# Patient Record
Sex: Female | Born: 1945
Health system: Southern US, Community
[De-identification: ages and names within clinical notes are randomized; demographics above are authoritative.]

## PROBLEM LIST (undated history)

## (undated) DIAGNOSIS — I2510 Atherosclerotic heart disease of native coronary artery without angina pectoris: Secondary | ICD-10-CM

## (undated) DIAGNOSIS — K219 Gastro-esophageal reflux disease without esophagitis: Secondary | ICD-10-CM

## (undated) DIAGNOSIS — F419 Anxiety disorder, unspecified: Secondary | ICD-10-CM

## (undated) DIAGNOSIS — E875 Hyperkalemia: Secondary | ICD-10-CM

## (undated) DIAGNOSIS — E7800 Pure hypercholesterolemia, unspecified: Secondary | ICD-10-CM

## (undated) DIAGNOSIS — J42 Unspecified chronic bronchitis: Secondary | ICD-10-CM

## (undated) DIAGNOSIS — E2839 Other primary ovarian failure: Secondary | ICD-10-CM

## (undated) DIAGNOSIS — R5383 Other fatigue: Secondary | ICD-10-CM

## (undated) DIAGNOSIS — R42 Dizziness and giddiness: Secondary | ICD-10-CM

## (undated) DIAGNOSIS — R0602 Shortness of breath: Secondary | ICD-10-CM

## (undated) DIAGNOSIS — I10 Essential (primary) hypertension: Secondary | ICD-10-CM

## (undated) DIAGNOSIS — I509 Heart failure, unspecified: Secondary | ICD-10-CM

## (undated) DIAGNOSIS — J449 Chronic obstructive pulmonary disease, unspecified: Secondary | ICD-10-CM

## (undated) HISTORY — DX: Other primary ovarian failure: E28.39

## (undated) HISTORY — PX: APPENDECTOMY: SHX54

## (undated) HISTORY — DX: Chronic obstructive pulmonary disease, unspecified: J44.9

## (undated) HISTORY — DX: Dizziness and giddiness: R42

## (undated) HISTORY — PX: ABDOMINAL HYSTERECTOMY: SHX81

## (undated) HISTORY — DX: Anxiety disorder, unspecified: F41.9

## (undated) HISTORY — DX: Other fatigue: R53.83

## (undated) HISTORY — DX: Atherosclerotic heart disease of native coronary artery without angina pectoris: I25.10

## (undated) HISTORY — DX: Shortness of breath: R06.02

## (undated) HISTORY — PX: BREAST BIOPSY: SHX20

## (undated) HISTORY — DX: Pure hypercholesterolemia, unspecified: E78.00

## (undated) HISTORY — DX: Hyperkalemia: E87.5

## (undated) HISTORY — DX: Gastro-esophageal reflux disease without esophagitis: K21.9

## (undated) HISTORY — DX: Essential (primary) hypertension: I10

---

## 1998-01-11 HISTORY — PX: CARDIAC CATHETERIZATION: SHX172

## 1998-01-11 HISTORY — PX: CORONARY ARTERY BYPASS GRAFT: SHX141

## 1998-10-21 ENCOUNTER — Encounter: Payer: Self-pay | Admitting: Internal Medicine

## 1998-10-21 ENCOUNTER — Inpatient Hospital Stay (HOSPITAL_COMMUNITY): Admission: EM | Admit: 1998-10-21 | Discharge: 1998-10-27 | Payer: Self-pay | Admitting: Internal Medicine

## 1998-10-22 ENCOUNTER — Encounter: Payer: Self-pay | Admitting: Internal Medicine

## 1998-10-23 ENCOUNTER — Encounter: Payer: Self-pay | Admitting: Thoracic Surgery (Cardiothoracic Vascular Surgery)

## 1998-10-24 ENCOUNTER — Encounter: Payer: Self-pay | Admitting: Thoracic Surgery (Cardiothoracic Vascular Surgery)

## 1998-10-25 ENCOUNTER — Encounter: Payer: Self-pay | Admitting: Thoracic Surgery (Cardiothoracic Vascular Surgery)

## 2015-03-26 DIAGNOSIS — Z951 Presence of aortocoronary bypass graft: Secondary | ICD-10-CM | POA: Diagnosis not present

## 2015-03-26 DIAGNOSIS — R05 Cough: Secondary | ICD-10-CM | POA: Diagnosis not present

## 2015-03-26 DIAGNOSIS — I10 Essential (primary) hypertension: Secondary | ICD-10-CM | POA: Diagnosis not present

## 2015-03-26 DIAGNOSIS — F17200 Nicotine dependence, unspecified, uncomplicated: Secondary | ICD-10-CM | POA: Diagnosis not present

## 2015-03-26 DIAGNOSIS — J441 Chronic obstructive pulmonary disease with (acute) exacerbation: Secondary | ICD-10-CM | POA: Diagnosis not present

## 2015-03-26 DIAGNOSIS — Z299 Encounter for prophylactic measures, unspecified: Secondary | ICD-10-CM | POA: Diagnosis not present

## 2015-03-26 DIAGNOSIS — R0602 Shortness of breath: Secondary | ICD-10-CM | POA: Diagnosis not present

## 2015-03-28 DIAGNOSIS — E7800 Pure hypercholesterolemia, unspecified: Secondary | ICD-10-CM | POA: Diagnosis not present

## 2015-03-28 DIAGNOSIS — J449 Chronic obstructive pulmonary disease, unspecified: Secondary | ICD-10-CM | POA: Diagnosis not present

## 2015-03-28 DIAGNOSIS — Z681 Body mass index (BMI) 19 or less, adult: Secondary | ICD-10-CM | POA: Diagnosis not present

## 2015-03-28 DIAGNOSIS — I10 Essential (primary) hypertension: Secondary | ICD-10-CM | POA: Diagnosis not present

## 2015-03-28 DIAGNOSIS — F17200 Nicotine dependence, unspecified, uncomplicated: Secondary | ICD-10-CM | POA: Diagnosis not present

## 2015-03-28 DIAGNOSIS — F419 Anxiety disorder, unspecified: Secondary | ICD-10-CM | POA: Diagnosis not present

## 2015-04-01 DIAGNOSIS — F419 Anxiety disorder, unspecified: Secondary | ICD-10-CM | POA: Diagnosis not present

## 2015-04-01 DIAGNOSIS — F17200 Nicotine dependence, unspecified, uncomplicated: Secondary | ICD-10-CM | POA: Diagnosis not present

## 2015-04-01 DIAGNOSIS — J449 Chronic obstructive pulmonary disease, unspecified: Secondary | ICD-10-CM | POA: Diagnosis not present

## 2015-07-31 DIAGNOSIS — J449 Chronic obstructive pulmonary disease, unspecified: Secondary | ICD-10-CM | POA: Diagnosis not present

## 2015-07-31 DIAGNOSIS — I10 Essential (primary) hypertension: Secondary | ICD-10-CM | POA: Diagnosis not present

## 2015-08-28 DIAGNOSIS — I10 Essential (primary) hypertension: Secondary | ICD-10-CM | POA: Diagnosis not present

## 2015-08-28 DIAGNOSIS — J449 Chronic obstructive pulmonary disease, unspecified: Secondary | ICD-10-CM | POA: Diagnosis not present

## 2015-09-30 DIAGNOSIS — F17200 Nicotine dependence, unspecified, uncomplicated: Secondary | ICD-10-CM | POA: Diagnosis not present

## 2015-09-30 DIAGNOSIS — J441 Chronic obstructive pulmonary disease with (acute) exacerbation: Secondary | ICD-10-CM | POA: Diagnosis not present

## 2015-09-30 DIAGNOSIS — Z681 Body mass index (BMI) 19 or less, adult: Secondary | ICD-10-CM | POA: Diagnosis not present

## 2015-09-30 DIAGNOSIS — Z90710 Acquired absence of both cervix and uterus: Secondary | ICD-10-CM | POA: Diagnosis not present

## 2015-10-20 DIAGNOSIS — Z299 Encounter for prophylactic measures, unspecified: Secondary | ICD-10-CM | POA: Diagnosis not present

## 2015-10-20 DIAGNOSIS — Z1211 Encounter for screening for malignant neoplasm of colon: Secondary | ICD-10-CM | POA: Diagnosis not present

## 2015-10-20 DIAGNOSIS — Z0000 Encounter for general adult medical examination without abnormal findings: Secondary | ICD-10-CM | POA: Diagnosis not present

## 2015-10-20 DIAGNOSIS — Z79899 Other long term (current) drug therapy: Secondary | ICD-10-CM | POA: Diagnosis not present

## 2015-10-20 DIAGNOSIS — Z7189 Other specified counseling: Secondary | ICD-10-CM | POA: Diagnosis not present

## 2015-10-20 DIAGNOSIS — Z1389 Encounter for screening for other disorder: Secondary | ICD-10-CM | POA: Diagnosis not present

## 2015-10-20 DIAGNOSIS — R5383 Other fatigue: Secondary | ICD-10-CM | POA: Diagnosis not present

## 2015-10-20 DIAGNOSIS — E7800 Pure hypercholesterolemia, unspecified: Secondary | ICD-10-CM | POA: Diagnosis not present

## 2015-11-24 DIAGNOSIS — J449 Chronic obstructive pulmonary disease, unspecified: Secondary | ICD-10-CM | POA: Diagnosis not present

## 2015-11-24 DIAGNOSIS — I10 Essential (primary) hypertension: Secondary | ICD-10-CM | POA: Diagnosis not present

## 2016-01-13 DIAGNOSIS — E7800 Pure hypercholesterolemia, unspecified: Secondary | ICD-10-CM | POA: Diagnosis not present

## 2016-01-14 DIAGNOSIS — I10 Essential (primary) hypertension: Secondary | ICD-10-CM | POA: Diagnosis not present

## 2016-01-14 DIAGNOSIS — J449 Chronic obstructive pulmonary disease, unspecified: Secondary | ICD-10-CM | POA: Diagnosis not present

## 2016-01-20 DIAGNOSIS — Z681 Body mass index (BMI) 19 or less, adult: Secondary | ICD-10-CM | POA: Diagnosis not present

## 2016-01-20 DIAGNOSIS — I10 Essential (primary) hypertension: Secondary | ICD-10-CM | POA: Diagnosis not present

## 2016-01-20 DIAGNOSIS — J439 Emphysema, unspecified: Secondary | ICD-10-CM | POA: Diagnosis not present

## 2016-01-20 DIAGNOSIS — Z299 Encounter for prophylactic measures, unspecified: Secondary | ICD-10-CM | POA: Diagnosis not present

## 2016-01-20 DIAGNOSIS — F17210 Nicotine dependence, cigarettes, uncomplicated: Secondary | ICD-10-CM | POA: Diagnosis not present

## 2016-02-25 DIAGNOSIS — J449 Chronic obstructive pulmonary disease, unspecified: Secondary | ICD-10-CM | POA: Diagnosis not present

## 2016-02-25 DIAGNOSIS — I10 Essential (primary) hypertension: Secondary | ICD-10-CM | POA: Diagnosis not present

## 2016-04-20 DIAGNOSIS — I10 Essential (primary) hypertension: Secondary | ICD-10-CM | POA: Diagnosis not present

## 2016-04-20 DIAGNOSIS — E7800 Pure hypercholesterolemia, unspecified: Secondary | ICD-10-CM | POA: Diagnosis not present

## 2016-04-20 DIAGNOSIS — Z681 Body mass index (BMI) 19 or less, adult: Secondary | ICD-10-CM | POA: Diagnosis not present

## 2016-04-20 DIAGNOSIS — F17210 Nicotine dependence, cigarettes, uncomplicated: Secondary | ICD-10-CM | POA: Diagnosis not present

## 2016-04-20 DIAGNOSIS — J449 Chronic obstructive pulmonary disease, unspecified: Secondary | ICD-10-CM | POA: Diagnosis not present

## 2016-04-20 DIAGNOSIS — Z299 Encounter for prophylactic measures, unspecified: Secondary | ICD-10-CM | POA: Diagnosis not present

## 2016-05-21 DIAGNOSIS — J449 Chronic obstructive pulmonary disease, unspecified: Secondary | ICD-10-CM | POA: Diagnosis not present

## 2016-05-21 DIAGNOSIS — I10 Essential (primary) hypertension: Secondary | ICD-10-CM | POA: Diagnosis not present

## 2016-06-11 DIAGNOSIS — I10 Essential (primary) hypertension: Secondary | ICD-10-CM | POA: Diagnosis not present

## 2016-06-11 DIAGNOSIS — J449 Chronic obstructive pulmonary disease, unspecified: Secondary | ICD-10-CM | POA: Diagnosis not present

## 2016-07-27 DIAGNOSIS — J449 Chronic obstructive pulmonary disease, unspecified: Secondary | ICD-10-CM | POA: Diagnosis not present

## 2016-07-27 DIAGNOSIS — I10 Essential (primary) hypertension: Secondary | ICD-10-CM | POA: Diagnosis not present

## 2016-09-01 DIAGNOSIS — J449 Chronic obstructive pulmonary disease, unspecified: Secondary | ICD-10-CM | POA: Diagnosis not present

## 2016-09-01 DIAGNOSIS — I10 Essential (primary) hypertension: Secondary | ICD-10-CM | POA: Diagnosis not present

## 2016-09-28 DIAGNOSIS — J449 Chronic obstructive pulmonary disease, unspecified: Secondary | ICD-10-CM | POA: Diagnosis not present

## 2016-09-28 DIAGNOSIS — I10 Essential (primary) hypertension: Secondary | ICD-10-CM | POA: Diagnosis not present

## 2016-11-29 DIAGNOSIS — Z681 Body mass index (BMI) 19 or less, adult: Secondary | ICD-10-CM | POA: Diagnosis not present

## 2016-11-29 DIAGNOSIS — J441 Chronic obstructive pulmonary disease with (acute) exacerbation: Secondary | ICD-10-CM | POA: Diagnosis not present

## 2016-11-29 DIAGNOSIS — J449 Chronic obstructive pulmonary disease, unspecified: Secondary | ICD-10-CM | POA: Diagnosis not present

## 2016-11-29 DIAGNOSIS — I10 Essential (primary) hypertension: Secondary | ICD-10-CM | POA: Diagnosis not present

## 2016-11-29 DIAGNOSIS — F17210 Nicotine dependence, cigarettes, uncomplicated: Secondary | ICD-10-CM | POA: Diagnosis not present

## 2016-11-29 DIAGNOSIS — Z299 Encounter for prophylactic measures, unspecified: Secondary | ICD-10-CM | POA: Diagnosis not present

## 2016-12-06 DIAGNOSIS — J449 Chronic obstructive pulmonary disease, unspecified: Secondary | ICD-10-CM | POA: Diagnosis not present

## 2016-12-06 DIAGNOSIS — I10 Essential (primary) hypertension: Secondary | ICD-10-CM | POA: Diagnosis not present

## 2016-12-11 DIAGNOSIS — I509 Heart failure, unspecified: Secondary | ICD-10-CM

## 2016-12-11 HISTORY — DX: Heart failure, unspecified: I50.9

## 2016-12-29 DIAGNOSIS — Z681 Body mass index (BMI) 19 or less, adult: Secondary | ICD-10-CM | POA: Diagnosis not present

## 2016-12-29 DIAGNOSIS — R079 Chest pain, unspecified: Secondary | ICD-10-CM | POA: Diagnosis not present

## 2016-12-29 DIAGNOSIS — Z951 Presence of aortocoronary bypass graft: Secondary | ICD-10-CM | POA: Diagnosis not present

## 2016-12-29 DIAGNOSIS — E871 Hypo-osmolality and hyponatremia: Secondary | ICD-10-CM | POA: Diagnosis not present

## 2016-12-29 DIAGNOSIS — J189 Pneumonia, unspecified organism: Secondary | ICD-10-CM | POA: Diagnosis not present

## 2016-12-29 DIAGNOSIS — Z299 Encounter for prophylactic measures, unspecified: Secondary | ICD-10-CM | POA: Diagnosis not present

## 2016-12-29 DIAGNOSIS — J90 Pleural effusion, not elsewhere classified: Secondary | ICD-10-CM | POA: Diagnosis not present

## 2016-12-29 DIAGNOSIS — R0602 Shortness of breath: Secondary | ICD-10-CM | POA: Diagnosis not present

## 2016-12-29 DIAGNOSIS — F17210 Nicotine dependence, cigarettes, uncomplicated: Secondary | ICD-10-CM | POA: Diagnosis not present

## 2016-12-29 DIAGNOSIS — Z720 Tobacco use: Secondary | ICD-10-CM | POA: Diagnosis not present

## 2016-12-29 DIAGNOSIS — R7989 Other specified abnormal findings of blood chemistry: Secondary | ICD-10-CM | POA: Diagnosis not present

## 2016-12-29 DIAGNOSIS — Z2821 Immunization not carried out because of patient refusal: Secondary | ICD-10-CM | POA: Diagnosis not present

## 2016-12-29 DIAGNOSIS — R5383 Other fatigue: Secondary | ICD-10-CM | POA: Diagnosis not present

## 2016-12-29 DIAGNOSIS — I509 Heart failure, unspecified: Secondary | ICD-10-CM | POA: Diagnosis not present

## 2016-12-29 DIAGNOSIS — Z7982 Long term (current) use of aspirin: Secondary | ICD-10-CM | POA: Diagnosis not present

## 2016-12-29 DIAGNOSIS — F17200 Nicotine dependence, unspecified, uncomplicated: Secondary | ICD-10-CM | POA: Diagnosis not present

## 2016-12-29 DIAGNOSIS — I10 Essential (primary) hypertension: Secondary | ICD-10-CM | POA: Diagnosis not present

## 2016-12-29 DIAGNOSIS — I2510 Atherosclerotic heart disease of native coronary artery without angina pectoris: Secondary | ICD-10-CM | POA: Diagnosis not present

## 2016-12-29 DIAGNOSIS — I5021 Acute systolic (congestive) heart failure: Secondary | ICD-10-CM | POA: Diagnosis not present

## 2016-12-29 DIAGNOSIS — J449 Chronic obstructive pulmonary disease, unspecified: Secondary | ICD-10-CM | POA: Diagnosis not present

## 2016-12-30 DIAGNOSIS — I2510 Atherosclerotic heart disease of native coronary artery without angina pectoris: Secondary | ICD-10-CM | POA: Diagnosis present

## 2016-12-30 DIAGNOSIS — F17200 Nicotine dependence, unspecified, uncomplicated: Secondary | ICD-10-CM | POA: Diagnosis present

## 2016-12-30 DIAGNOSIS — Z720 Tobacco use: Secondary | ICD-10-CM | POA: Diagnosis not present

## 2016-12-30 DIAGNOSIS — Z79899 Other long term (current) drug therapy: Secondary | ICD-10-CM | POA: Diagnosis not present

## 2016-12-30 DIAGNOSIS — J90 Pleural effusion, not elsewhere classified: Secondary | ICD-10-CM | POA: Diagnosis not present

## 2016-12-30 DIAGNOSIS — Z7982 Long term (current) use of aspirin: Secondary | ICD-10-CM | POA: Diagnosis not present

## 2016-12-30 DIAGNOSIS — J189 Pneumonia, unspecified organism: Secondary | ICD-10-CM | POA: Diagnosis not present

## 2016-12-30 DIAGNOSIS — E871 Hypo-osmolality and hyponatremia: Secondary | ICD-10-CM | POA: Diagnosis not present

## 2016-12-30 DIAGNOSIS — Z951 Presence of aortocoronary bypass graft: Secondary | ICD-10-CM | POA: Diagnosis not present

## 2016-12-30 DIAGNOSIS — R7989 Other specified abnormal findings of blood chemistry: Secondary | ICD-10-CM | POA: Diagnosis not present

## 2016-12-30 DIAGNOSIS — I5021 Acute systolic (congestive) heart failure: Secondary | ICD-10-CM | POA: Diagnosis present

## 2016-12-30 DIAGNOSIS — I509 Heart failure, unspecified: Secondary | ICD-10-CM | POA: Diagnosis not present

## 2017-01-14 DIAGNOSIS — J449 Chronic obstructive pulmonary disease, unspecified: Secondary | ICD-10-CM | POA: Diagnosis not present

## 2017-01-14 DIAGNOSIS — I5022 Chronic systolic (congestive) heart failure: Secondary | ICD-10-CM | POA: Diagnosis not present

## 2017-01-14 DIAGNOSIS — I501 Left ventricular failure: Secondary | ICD-10-CM | POA: Diagnosis not present

## 2017-01-14 DIAGNOSIS — I10 Essential (primary) hypertension: Secondary | ICD-10-CM | POA: Diagnosis not present

## 2017-01-14 DIAGNOSIS — E7800 Pure hypercholesterolemia, unspecified: Secondary | ICD-10-CM | POA: Diagnosis not present

## 2017-01-14 DIAGNOSIS — Z681 Body mass index (BMI) 19 or less, adult: Secondary | ICD-10-CM | POA: Diagnosis not present

## 2017-01-14 DIAGNOSIS — Z299 Encounter for prophylactic measures, unspecified: Secondary | ICD-10-CM | POA: Diagnosis not present

## 2017-02-07 ENCOUNTER — Encounter: Payer: Self-pay | Admitting: *Deleted

## 2017-02-07 ENCOUNTER — Other Ambulatory Visit: Payer: Self-pay

## 2017-02-07 ENCOUNTER — Telehealth: Payer: Self-pay | Admitting: Cardiovascular Disease

## 2017-02-07 ENCOUNTER — Ambulatory Visit (INDEPENDENT_AMBULATORY_CARE_PROVIDER_SITE_OTHER): Payer: Medicare Other | Admitting: Cardiovascular Disease

## 2017-02-07 ENCOUNTER — Encounter: Payer: Self-pay | Admitting: Cardiovascular Disease

## 2017-02-07 VITALS — BP 152/77 | HR 75 | Ht 60.0 in | Wt 87.0 lb

## 2017-02-07 DIAGNOSIS — I519 Heart disease, unspecified: Secondary | ICD-10-CM

## 2017-02-07 DIAGNOSIS — I10 Essential (primary) hypertension: Secondary | ICD-10-CM

## 2017-02-07 DIAGNOSIS — Z9289 Personal history of other medical treatment: Secondary | ICD-10-CM | POA: Diagnosis not present

## 2017-02-07 DIAGNOSIS — I25118 Atherosclerotic heart disease of native coronary artery with other forms of angina pectoris: Secondary | ICD-10-CM | POA: Diagnosis not present

## 2017-02-07 DIAGNOSIS — I5022 Chronic systolic (congestive) heart failure: Secondary | ICD-10-CM | POA: Diagnosis not present

## 2017-02-07 DIAGNOSIS — Z951 Presence of aortocoronary bypass graft: Secondary | ICD-10-CM

## 2017-02-07 DIAGNOSIS — Z720 Tobacco use: Secondary | ICD-10-CM

## 2017-02-07 MED ORDER — SACUBITRIL-VALSARTAN 49-51 MG PO TABS: 1.0000 | ORAL_TABLET | Freq: Two times a day (BID) | ORAL | 6 refills | Status: DC

## 2017-02-07 MED ORDER — ROSUVASTATIN CALCIUM 5 MG PO TABS: 5.0000 mg | ORAL_TABLET | Freq: Every day | ORAL | 6 refills | Status: DC

## 2017-02-07 NOTE — Telephone Encounter (Signed)
Pre-cert Verification for the following procedure   lexiscan scheduled for 02/16/17 at [facility]

## 2017-02-07 NOTE — Progress Notes (Signed)
CARDIOLOGY CONSULT NOTE  Patient ID: Susan [name] MRN: [id number] DOB/AGE: 73-Aug-1947 73 y.o.  Admit date: (Not on file) Primary Physician: [name], MD Referring Physician: Dr. Sherryll Burger  Reason for Consultation: CHF  HPI: Susan [name] is a 72 y.o. female who is being seen today for the evaluation of congestive heart failure at the request of [name], MD.   I reviewed all documentation, labs, and studies from her PCP.  She is apparently hospitalized at [facility] for shortness of breath and diagnosed with congestive heart failure in December 2018.  She is currently on metoprolol succinate, lisinopril, Lasix, and Entresto.  An echocardiogram performed at an outside facility on 12/31/16 demonstrated severely reduced left ventricular systolic function, LVEF 20-25%, grade 2 diastolic dysfunction with elevated filling pressures, mild left atrial dilatation, mild right ventricular dilatation, moderately reduced right ventricular systolic function, mild to moderate mitral and moderate tricuspid regurgitation.  There was pulmonary hypertension, RVSP 58 mmHg.  The IVC was dilated and did not collapse with respiration.  I reviewed an ECG performed on 12/29/16 which demonstrated sinus tachycardia, 110 bpm, with nonspecific ST segment abnormalities in V5 and V6.  She tells me she underwent 5 vessel coronary artery bypass graft surgery in 2000.  She has not followed up with a cardiologist since shortly after bypass surgery.  The patient denies any symptoms of chest pain, palpitations, shortness of breath, lightheadedness, dizziness, leg swelling, orthopnea, PND, and syncope.  She had been smoking a pack of cigarettes daily but recently cut back to 1/2 pack daily.   No Known Allergies  Current Outpatient Medications  Medication Sig Dispense Refill  . aspirin EC 81 MG tablet Take 81 mg by mouth daily.    . furosemide (LASIX) 40 MG tablet Take 40 mg by mouth 2 (two) times  daily.  0  . KLOR-CON M20 20 MEQ tablet Take 20 mEq by mouth daily.  0  . lisinopril (PRINIVIL,ZESTRIL) 10 MG tablet Take 10 mg by mouth daily.  0  . metoprolol succinate (TOPROL-XL) 25 MG 24 hr tablet Take 12.5 mg by mouth daily.  0  . Multiple Vitamin (MULTIVITAMIN) tablet Take 1 tablet by mouth daily.    . sacubitril-valsartan (ENTRESTO) 24-26 MG Take 1 tablet by mouth 2 (two) times daily.    . vitamin B-12 (CYANOCOBALAMIN) 250 MCG tablet Take 250 mcg by mouth daily.     No current facility-administered medications for this visit.     Past Medical History:  Diagnosis Date  . Anxiety   . CAD (coronary artery disease)   . COPD (chronic obstructive pulmonary disease) (HCC)   . Dizziness   . Esophageal reflux   . Essential hypertension   . Fatigue   . Hypercholesteremia   . Hyperkalemia   . Ovarian failure   . SOB (shortness of breath)     Past Surgical History:  Procedure Laterality Date  . ABDOMINAL HYSTERECTOMY    . APPENDECTOMY      Social History   Socioeconomic History  . Marital status: Married    Spouse name: Not on file  . Number of children: Not on file  . Years of education: Not on file  . Highest education level: Not on file  Social Needs  . Financial resource strain: Not on file  . Food insecurity - worry: Not on file  . Food insecurity - inability: Not on file  . Transportation needs - medical: Not on file  . Transportation needs -  non-medical: Not on file  Occupational History  . Not on file  Tobacco Use  . Smoking status: Current Every Day Smoker    Packs/day: 1.00    Types: Cigarettes  . Smokeless tobacco: Never Used  Substance and Sexual Activity  . Alcohol use: Not on file  . Drug use: Not on file  . Sexual activity: Not on file  Other Topics Concern  . Not on file  Social History Narrative  . Not on file     No family history of premature CAD in 1st degree relatives.  Current Meds  Medication Sig  . aspirin EC 81 MG tablet Take 81  mg by mouth daily.  . furosemide (LASIX) 40 MG tablet Take 40 mg by mouth 2 (two) times daily.  Marland Kitchen KLOR-CON M20 20 MEQ tablet Take 20 mEq by mouth daily.  Marland Kitchen lisinopril (PRINIVIL,ZESTRIL) 10 MG tablet Take 10 mg by mouth daily.  . metoprolol succinate (TOPROL-XL) 25 MG 24 hr tablet Take 12.5 mg by mouth daily.  . Multiple Vitamin (MULTIVITAMIN) tablet Take 1 tablet by mouth daily.  . sacubitril-valsartan (ENTRESTO) 24-26 MG Take 1 tablet by mouth 2 (two) times daily.  . vitamin B-12 (CYANOCOBALAMIN) 250 MCG tablet Take 250 mcg by mouth daily.  . [DISCONTINUED] aspirin 325 MG EC tablet Take 325 mg by mouth daily.      Review of systems complete and found to be negative unless listed above in HPI    Physical exam Blood pressure (!) 152/77, pulse 75, height 5' (1.524 m), weight 87 lb (39.5 kg), SpO2 98 %. General: NAD, thin build Neck: No JVD, no thyromegaly or thyroid nodule.  Lungs: Diminished throughout, no crackles or wheezes. CV: Nondisplaced PMI. Regular rate and rhythm, normal S1/S2, no S3/S4, no murmur.  No peripheral edema.  No carotid bruit.    Abdomen: Soft, nontender, no distention.  Skin: Intact without lesions or rashes.  Neurologic: Alert and oriented x 3.  Psych: Normal affect. Extremities: No clubbing or cyanosis.  HEENT: Normal.   ECG: Most recent ECG reviewed.   Labs: No results found for: K, BUN, CREATININE, ALT, TSH, HGB   Lipids: No results found for: LDLCALC, LDLDIRECT, CHOL, TRIG, HDL      ASSESSMENT AND PLAN:  1.  Chronic systolic heart failure: Symptomatically stable and appears euvolemic.  Currently on Toprol-XL 12.5 mg daily, lisinopril 10 mg daily, Lasix 40 mg twice daily, and Entresto 24-26 mg twice daily.  There is no indication to be on both an ACE inhibitor and angiotensin receptor blocker and it significantly increases the risk for hyperkalemia.  For this reason, I will discontinue lisinopril.  Blood pressure is elevated.  I will increase the dose  of Entresto to 49-51 mg bid. She will need to be evaluated for an ischemic etiology given her history of 5 vessel CABG in 2000.  I will obtain a Lexiscan Myoview stress test.  2.  Hypertension: There is no indication to be on both an ACE inhibitor and angiotensin receptor blocker and it significantly increases the risk for hyperkalemia.  For this reason, I will discontinue lisinopril.  Blood pressure is elevated.  I will increase the dose of Entresto to 49-51 mg bid.  3.  Coronary artery disease with history of 5 vessel CABG: Symptomatically stable.  She is currently on aspirin, angiotensin receptor blocker, and low-dose metoprolol succinate.  I will add a low-dose of Crestor 5 mg daily.  I will obtain a copy of lipids from PCP. As  it has been 19 years since bypass surgery and she now has severe left ventricular dysfunction, she has likely lost at least one saphenous vein graft if not more.  I will try and obtain a copy of the operative report.  I will also obtain a Lexiscan Myoview nuclear stress test to evaluate for significant ischemia.  4.  Tobacco abuse disorder: Currently smoking 1/2 pack of cigarettes daily.    Disposition: Follow up in 6-8 weeks.   Signed: Prentice Docker, M.D., F.A.C.C.  02/07/2017, 2:53 PM

## 2017-02-07 NOTE — Patient Instructions (Signed)
Medication Instructions:   Stop Lisinopril.   Increase Entresto 49/51mg  twice a day.  Begin Crestor 5mg  daily.  Continue all other medications.    Labwork: none  Testing/Procedures:  Your physician has requested that you have a lexiscan myoview. For further information please visit [email or web address] Please follow instruction sheet, as given.  Office will contact with results via phone or letter.    Follow-Up: 6-8 weeks   Any Other Special Instructions Will Be Listed Below (If Applicable).  If you need a refill on your cardiac medications before your next appointment, please call your pharmacy.

## 2017-02-07 NOTE — Addendum Note (Signed)
Addended by: Lesle Chris on: 02/07/2017 03:22 PM   Modules accepted: Orders

## 2017-02-11 ENCOUNTER — Encounter: Payer: Self-pay | Admitting: *Deleted

## 2017-02-16 ENCOUNTER — Encounter (HOSPITAL_COMMUNITY)
Admission: RE | Admit: 2017-02-16 | Discharge: 2017-02-16 | Disposition: A | Discharge status: Home / Self Care | Payer: Medicare Other | Source: Ambulatory Visit | Attending: Cardiovascular Disease | Admitting: Cardiovascular Disease

## 2017-02-16 ENCOUNTER — Encounter (HOSPITAL_COMMUNITY): Payer: Self-pay

## 2017-02-16 ENCOUNTER — Inpatient Hospital Stay (HOSPITAL_COMMUNITY)
Admission: AD | Admit: 2017-02-16 | Discharge: 2017-02-18 | DRG: 287 | Disposition: A | Discharge status: Home / Self Care | Payer: Medicare Other | Source: Ambulatory Visit | Attending: Cardiovascular Disease | Admitting: Cardiovascular Disease

## 2017-02-16 ENCOUNTER — Encounter (HOSPITAL_COMMUNITY): Payer: Self-pay | Admitting: General Practice

## 2017-02-16 ENCOUNTER — Encounter: Payer: Self-pay | Admitting: *Deleted

## 2017-02-16 ENCOUNTER — Other Ambulatory Visit: Payer: Self-pay

## 2017-02-16 ENCOUNTER — Other Ambulatory Visit (HOSPITAL_COMMUNITY)
Admission: RE | Admit: 2017-02-16 | Discharge: 2017-02-16 | Disposition: A | Discharge status: Home / Self Care | Payer: Medicare Other | Source: Ambulatory Visit | Attending: Cardiovascular Disease | Admitting: Cardiovascular Disease

## 2017-02-16 ENCOUNTER — Telehealth: Payer: Self-pay | Admitting: *Deleted

## 2017-02-16 ENCOUNTER — Ambulatory Visit (HOSPITAL_BASED_OUTPATIENT_CLINIC_OR_DEPARTMENT_OTHER)
Admission: RE | Admit: 2017-02-16 | Discharge: 2017-02-16 | Disposition: A | Discharge status: Home / Self Care | Payer: Medicare Other | Source: Ambulatory Visit | Attending: Cardiovascular Disease | Admitting: Cardiovascular Disease

## 2017-02-16 ENCOUNTER — Other Ambulatory Visit: Payer: Self-pay | Admitting: *Deleted

## 2017-02-16 DIAGNOSIS — Z79899 Other long term (current) drug therapy: Secondary | ICD-10-CM | POA: Diagnosis not present

## 2017-02-16 DIAGNOSIS — I5189 Other ill-defined heart diseases: Secondary | ICD-10-CM | POA: Diagnosis present

## 2017-02-16 DIAGNOSIS — Z01818 Encounter for other preprocedural examination: Secondary | ICD-10-CM

## 2017-02-16 DIAGNOSIS — Z951 Presence of aortocoronary bypass graft: Secondary | ICD-10-CM | POA: Diagnosis not present

## 2017-02-16 DIAGNOSIS — N183 Chronic kidney disease, stage 3 (moderate): Secondary | ICD-10-CM

## 2017-02-16 DIAGNOSIS — R079 Chest pain, unspecified: Secondary | ICD-10-CM

## 2017-02-16 DIAGNOSIS — I10 Essential (primary) hypertension: Secondary | ICD-10-CM

## 2017-02-16 DIAGNOSIS — I5022 Chronic systolic (congestive) heart failure: Secondary | ICD-10-CM | POA: Diagnosis not present

## 2017-02-16 DIAGNOSIS — R9439 Abnormal result of other cardiovascular function study: Secondary | ICD-10-CM

## 2017-02-16 DIAGNOSIS — I25708 Atherosclerosis of coronary artery bypass graft(s), unspecified, with other forms of angina pectoris: Secondary | ICD-10-CM | POA: Diagnosis not present

## 2017-02-16 DIAGNOSIS — Z7982 Long term (current) use of aspirin: Secondary | ICD-10-CM | POA: Diagnosis not present

## 2017-02-16 DIAGNOSIS — I2510 Atherosclerotic heart disease of native coronary artery without angina pectoris: Secondary | ICD-10-CM | POA: Diagnosis present

## 2017-02-16 DIAGNOSIS — E7800 Pure hypercholesterolemia, unspecified: Secondary | ICD-10-CM | POA: Diagnosis not present

## 2017-02-16 DIAGNOSIS — I5042 Chronic combined systolic (congestive) and diastolic (congestive) heart failure: Secondary | ICD-10-CM | POA: Diagnosis not present

## 2017-02-16 DIAGNOSIS — I130 Hypertensive heart and chronic kidney disease with heart failure and stage 1 through stage 4 chronic kidney disease, or unspecified chronic kidney disease: Secondary | ICD-10-CM | POA: Diagnosis not present

## 2017-02-16 DIAGNOSIS — E7849 Other hyperlipidemia: Secondary | ICD-10-CM | POA: Diagnosis not present

## 2017-02-16 DIAGNOSIS — I272 Pulmonary hypertension, unspecified: Secondary | ICD-10-CM | POA: Diagnosis present

## 2017-02-16 DIAGNOSIS — I255 Ischemic cardiomyopathy: Secondary | ICD-10-CM | POA: Diagnosis not present

## 2017-02-16 DIAGNOSIS — Z90710 Acquired absence of both cervix and uterus: Secondary | ICD-10-CM

## 2017-02-16 DIAGNOSIS — K219 Gastro-esophageal reflux disease without esophagitis: Secondary | ICD-10-CM | POA: Diagnosis not present

## 2017-02-16 DIAGNOSIS — E785 Hyperlipidemia, unspecified: Secondary | ICD-10-CM | POA: Diagnosis present

## 2017-02-16 DIAGNOSIS — J449 Chronic obstructive pulmonary disease, unspecified: Secondary | ICD-10-CM | POA: Diagnosis not present

## 2017-02-16 DIAGNOSIS — F17210 Nicotine dependence, cigarettes, uncomplicated: Secondary | ICD-10-CM | POA: Diagnosis not present

## 2017-02-16 DIAGNOSIS — F17200 Nicotine dependence, unspecified, uncomplicated: Secondary | ICD-10-CM | POA: Diagnosis present

## 2017-02-16 DIAGNOSIS — N289 Disorder of kidney and ureter, unspecified: Secondary | ICD-10-CM

## 2017-02-16 HISTORY — DX: Unspecified chronic bronchitis: J42

## 2017-02-16 HISTORY — DX: Heart failure, unspecified: I50.9

## 2017-02-16 LAB — CBC WITH DIFFERENTIAL/PLATELET
BASOS ABS: 0 10*3/uL (ref 0.0–0.1)
BASOS PCT: 0 %
EOS PCT: 3 %
Eosinophils Absolute: 0.4 10*3/uL (ref 0.0–0.7)
HCT: 45.5 % (ref 36.0–46.0)
Hemoglobin: 14.8 g/dL (ref 12.0–15.0)
Lymphocytes Relative: 30 %
Lymphs Abs: 3.2 10*3/uL (ref 0.7–4.0)
MCH: 31.7 pg (ref 26.0–34.0)
MCHC: 32.5 g/dL (ref 30.0–36.0)
MCV: 97.4 fL (ref 78.0–100.0)
MONO ABS: 0.8 10*3/uL (ref 0.1–1.0)
Monocytes Relative: 7 %
NEUTROS ABS: 6.3 10*3/uL (ref 1.7–7.7)
Neutrophils Relative %: 60 %
PLATELETS: 251 10*3/uL (ref 150–400)
RBC: 4.67 MIL/uL (ref 3.87–5.11)
RDW: 14.2 % (ref 11.5–15.5)
WBC: 10.7 10*3/uL — AB (ref 4.0–10.5)

## 2017-02-16 LAB — BASIC METABOLIC PANEL
Anion gap: 12 (ref 5–15)
BUN: 50 mg/dL — ABNORMAL HIGH (ref 6–20)
CALCIUM: 9.1 mg/dL (ref 8.9–10.3)
CO2: 27 mmol/L (ref 22–32)
Chloride: 93 mmol/L — ABNORMAL LOW (ref 101–111)
Creatinine, Ser: 1.44 mg/dL — ABNORMAL HIGH (ref 0.44–1.00)
GFR, EST AFRICAN AMERICAN: 41 mL/min — AB (ref >60)
GFR, EST NON AFRICAN AMERICAN: 35 mL/min — AB (ref >60)
Glucose, Bld: 116 mg/dL — ABNORMAL HIGH (ref 65–99)
Potassium: 4.5 mmol/L (ref 3.5–5.1)
Sodium: 132 mmol/L — ABNORMAL LOW (ref 135–145)

## 2017-02-16 LAB — NM MYOCAR MULTI W/SPECT W/WALL MOTION / EF
CHL CUP NUCLEAR SSS: 13
LV dias vol: 89 mL (ref 46–106)
LV sys vol: 63 mL
NUC STRESS TID: 0.77
RATE: 0.64
SDS: 2
SRS: 11

## 2017-02-16 LAB — CREATININE, SERUM
CREATININE: 1.63 mg/dL — AB (ref 0.44–1.00)
GFR calc Af Amer: 35 mL/min — ABNORMAL LOW (ref >60)
GFR calc non Af Amer: 30 mL/min — ABNORMAL LOW (ref >60)

## 2017-02-16 LAB — PROTIME-INR
INR: 0.92
PROTHROMBIN TIME: 12.3 s (ref 11.4–15.2)

## 2017-02-16 MED ORDER — SODIUM CHLORIDE 0.9 % IV SOLN: 250.0000 mL | INTRAVENOUS | Status: DC | PRN

## 2017-02-16 MED ORDER — ASPIRIN EC 81 MG PO TBEC
81.0000 mg | DELAYED_RELEASE_TABLET | Freq: Every day | ORAL | Status: DC
  Administered 2017-02-18: 81 mg via ORAL
  Filled 2017-02-16: qty 1

## 2017-02-16 MED ORDER — NITROGLYCERIN 0.4 MG SL SUBL: 0.4000 mg | SUBLINGUAL_TABLET | SUBLINGUAL | Status: DC | PRN

## 2017-02-16 MED ORDER — ASPIRIN 81 MG PO CHEW
81.0000 mg | CHEWABLE_TABLET | ORAL | Status: AC
  Administered 2017-02-17: 81 mg via ORAL

## 2017-02-16 MED ORDER — ENSURE ENLIVE PO LIQD
237.0000 mL | Freq: Two times a day (BID) | ORAL | Status: DC
  Administered 2017-02-17 – 2017-02-18 (×4): 237 mL via ORAL

## 2017-02-16 MED ORDER — TECHNETIUM TC 99M TETROFOSMIN IV KIT
30.0000 | PACK | Freq: Once | INTRAVENOUS | Status: AC | PRN
  Administered 2017-02-16: 33 via INTRAVENOUS

## 2017-02-16 MED ORDER — REGADENOSON 0.4 MG/5ML IV SOLN
INTRAVENOUS | Status: AC
  Administered 2017-02-16: 0.4 mg via INTRAVENOUS
  Filled 2017-02-16: qty 5

## 2017-02-16 MED ORDER — SODIUM CHLORIDE 0.9% FLUSH: 3.0000 mL | Freq: Two times a day (BID) | INTRAVENOUS | Status: DC

## 2017-02-16 MED ORDER — SODIUM CHLORIDE 0.9% FLUSH: 3.0000 mL | INTRAVENOUS | Status: DC | PRN

## 2017-02-16 MED ORDER — ROSUVASTATIN CALCIUM 10 MG PO TABS
5.0000 mg | ORAL_TABLET | Freq: Every day | ORAL | Status: DC
  Administered 2017-02-17 – 2017-02-18 (×2): 5 mg via ORAL
  Filled 2017-02-16 (×2): qty 1

## 2017-02-16 MED ORDER — HEPARIN SODIUM (PORCINE) 5000 UNIT/ML IJ SOLN
5000.0000 [IU] | Freq: Three times a day (TID) | INTRAMUSCULAR | Status: DC
  Administered 2017-02-16 – 2017-02-18 (×6): 5000 [IU] via SUBCUTANEOUS
  Filled 2017-02-16 (×8): qty 1

## 2017-02-16 MED ORDER — METOPROLOL SUCCINATE ER 25 MG PO TB24
12.5000 mg | ORAL_TABLET | Freq: Every day | ORAL | Status: DC
  Administered 2017-02-17 – 2017-02-18 (×2): 12.5 mg via ORAL
  Filled 2017-02-16 (×2): qty 1

## 2017-02-16 MED ORDER — SODIUM CHLORIDE 0.9 % IV SOLN
INTRAVENOUS | Status: DC
  Administered 2017-02-16: 13:00:00 via INTRAVENOUS

## 2017-02-16 MED ORDER — ONDANSETRON HCL 4 MG/2ML IJ SOLN: 4.0000 mg | Freq: Four times a day (QID) | INTRAMUSCULAR | Status: DC | PRN

## 2017-02-16 MED ORDER — ACETAMINOPHEN 325 MG PO TABS
650.0000 mg | ORAL_TABLET | ORAL | Status: DC | PRN
  Filled 2017-02-16: qty 2

## 2017-02-16 MED ORDER — ASPIRIN 81 MG PO CHEW: 81.0000 mg | CHEWABLE_TABLET | ORAL | Status: DC

## 2017-02-16 MED ORDER — SODIUM CHLORIDE 0.9 % IV SOLN
INTRAVENOUS | Status: DC
  Administered 2017-02-17: 07:00:00 via INTRAVENOUS

## 2017-02-16 MED ORDER — SODIUM CHLORIDE 0.9% FLUSH
3.0000 mL | Freq: Two times a day (BID) | INTRAVENOUS | Status: DC
  Administered 2017-02-16: 3 mL via INTRAVENOUS

## 2017-02-16 MED ORDER — TECHNETIUM TC 99M TETROFOSMIN IV KIT
10.0000 | PACK | Freq: Once | INTRAVENOUS | Status: AC | PRN
  Administered 2017-02-16: 11 via INTRAVENOUS

## 2017-02-16 MED ORDER — SODIUM CHLORIDE 0.9% FLUSH
INTRAVENOUS | Status: AC
  Administered 2017-02-16: 10 mL via INTRAVENOUS
  Filled 2017-02-16: qty 10

## 2017-02-16 NOTE — H&P (Signed)
CARDIOLOGY CONSULT NOTE  Patient ID: Susan [name] MRN: [id number] DOB/AGE: Mar 08, 1945 72 y.o.  Admit date: 02/16/2017 Primary Physician: [name], MD Referring Physician: Dr. Sherryll Burger  Reason for Consultation: CHF  HPI: Susan [name] is a 72 y.o. female who is being seen today for the evaluation of congestive heart failure at the request of No ref. provider found.   I reviewed all documentation, labs, and studies from her PCP.  She is apparently hospitalized at [facility] for shortness of breath and diagnosed with congestive heart failure in December 2018.  She is currently on metoprolol succinate, lisinopril, Lasix, and Entresto.  An echocardiogram performed at an outside facility on 12/31/16 demonstrated severely reduced left ventricular systolic function, LVEF 20-25%, grade 2 diastolic dysfunction with elevated filling pressures, mild left atrial dilatation, mild right ventricular dilatation, moderately reduced right ventricular systolic function, mild to moderate mitral and moderate tricuspid regurgitation.  There was pulmonary hypertension, RVSP 58 mmHg.  The IVC was dilated and did not collapse with respiration.  I reviewed an ECG performed on 12/29/16 which demonstrated sinus tachycardia, 110 bpm, with nonspecific ST segment abnormalities in V5 and V6.  She tells me she underwent 5 vessel coronary artery bypass graft surgery in 2000.  She has not followed up with a cardiologist since shortly after bypass surgery.  The patient denies any symptoms of chest pain, palpitations, shortness of breath, lightheadedness, dizziness, leg swelling, orthopnea, PND, and syncope.  She had been smoking a pack of cigarettes daily but recently cut back to 1/2 pack daily.   No Known Allergies  Current Facility-Administered Medications  Medication Dose Route Frequency Provider Last Rate Last Dose  . 0.9 %  sodium chloride infusion  250 mL Intravenous PRN Lorine Bears A, MD      .  0.9 %  sodium chloride infusion   Intravenous Continuous Iran Ouch, MD 50 mL/hr at 02/16/17 1300    . aspirin chewable tablet 81 mg  81 mg Oral Pre-Cath Arida, Muhammad A, MD      . sodium chloride flush (NS) 0.9 % injection 3 mL  3 mL Intravenous Q12H Arida, Muhammad A, MD      . sodium chloride flush (NS) 0.9 % injection 3 mL  3 mL Intravenous PRN Iran Ouch, MD        Past Medical History:  Diagnosis Date  . Anxiety   . CAD (coronary artery disease)   . COPD (chronic obstructive pulmonary disease) (HCC)   . Dizziness   . Esophageal reflux   . Essential hypertension   . Fatigue   . Hypercholesteremia   . Hyperkalemia   . Ovarian failure   . SOB (shortness of breath)     Past Surgical History:  Procedure Laterality Date  . ABDOMINAL HYSTERECTOMY    . APPENDECTOMY      Social History   Socioeconomic History  . Marital status: Married    Spouse name: Not on file  . Number of children: Not on file  . Years of education: Not on file  . Highest education level: Not on file  Social Needs  . Financial resource strain: Not on file  . Food insecurity - worry: Not on file  . Food insecurity - inability: Not on file  . Transportation needs - medical: Not on file  . Transportation needs - non-medical: Not on file  Occupational History  . Not on file  Tobacco Use  . Smoking status: Current Every Day  Smoker    Packs/day: 1.00    Types: Cigarettes  . Smokeless tobacco: Never Used  Substance and Sexual Activity  . Alcohol use: Not on file  . Drug use: Not on file  . Sexual activity: Not on file  Other Topics Concern  . Not on file  Social History Narrative  . Not on file     No family history of premature CAD in 1st degree relatives.  Current Meds  Medication Sig  . aspirin EC 81 MG tablet Take 81 mg by mouth daily.  . furosemide (LASIX) 40 MG tablet Take 40 mg by mouth 2 (two) times daily.  Marland Kitchen KLOR-CON M20 20 MEQ tablet Take 20 mEq by mouth daily.  .  metoprolol succinate (TOPROL-XL) 25 MG 24 hr tablet Take 12.5 mg by mouth daily.  . Multiple Vitamin (MULTIVITAMIN) tablet Take 1 tablet by mouth daily.  . rosuvastatin (CRESTOR) 5 MG tablet Take 1 tablet (5 mg total) by mouth daily.  . sacubitril-valsartan (ENTRESTO) 49-51 MG Take 1 tablet by mouth 2 (two) times daily.  . vitamin B-12 (CYANOCOBALAMIN) 250 MCG tablet Take 250 mcg by mouth daily.      Review of systems complete and found to be negative unless listed above in HPI    Physical exam Blood pressure 92/60, pulse (!) 43, temperature (!) 97.5 F (36.4 C), temperature source Oral, resp. rate 18, height 5' (1.524 m), weight 87 lb (39.5 kg), SpO2 98 %. General: NAD, thin build Neck: No JVD, no thyromegaly or thyroid nodule.  Lungs: Diminished throughout, no crackles or wheezes. CV: Nondisplaced PMI. Regular rate and rhythm, normal S1/S2, no S3/S4, no murmur.  No peripheral edema.  No carotid bruit.    Abdomen: Soft, nontender, no distention.  Skin: Intact without lesions or rashes.  Neurologic: Alert and oriented x 3.  Psych: Normal affect. Extremities: No clubbing or cyanosis.  HEENT: Normal.   ECG: Most recent ECG reviewed.   Labs: Lab Results  Component Value Date/Time   K 4.5 02/16/2017 11:07 AM   BUN 50 (H) 02/16/2017 11:07 AM   CREATININE 1.44 (H) 02/16/2017 11:07 AM   HGB 14.8 02/16/2017 11:07 AM     Lipids: No results found for: LDLCALC, LDLDIRECT, CHOL, TRIG, HDL      ASSESSMENT AND PLAN:  1.  Chronic systolic heart failure: Symptomatically stable and appears euvolemic.  Currently on Toprol-XL 12.5 mg daily, lisinopril 10 mg daily, Lasix 40 mg twice daily, and Entresto 24-26 mg twice daily.  There is no indication to be on both an ACE inhibitor and angiotensin receptor blocker and it significantly increases the risk for hyperkalemia.  For this reason, I will discontinue lisinopril.  Blood pressure is elevated.  I will increase the dose of Entresto to 49-51 mg  bid. She will need to be evaluated for an ischemic etiology given her history of 5 vessel CABG in 2000.  I will obtain a Lexiscan Myoview stress test.  2.  Hypertension: There is no indication to be on both an ACE inhibitor and angiotensin receptor blocker and it significantly increases the risk for hyperkalemia.  For this reason, I will discontinue lisinopril.  Blood pressure is elevated.  I will increase the dose of Entresto to 49-51 mg bid.  3.  Coronary artery disease with history of 5 vessel CABG: Symptomatically stable.  She is currently on aspirin, angiotensin receptor blocker, and low-dose metoprolol succinate.  I will add a low-dose of Crestor 5 mg daily.  I will obtain  a copy of lipids from PCP. As it has been 19 years since bypass surgery and she now has severe left ventricular dysfunction, she has likely lost at least one saphenous vein graft if not more.  I will try and obtain a copy of the operative report.  I will also obtain a Lexiscan Myoview nuclear stress test to evaluate for significant ischemia.  4.  Tobacco abuse disorder: Currently smoking 1/2 pack of cigarettes daily.    Disposition: Follow up in 6-8 weeks.   Signed: Prentice Docker, M.D., F.A.C.C.  02/07/2017, 2:06 PM   Addendum:    Pt presented for stress testing today. Found to be abnormal and high risk noted below:  Lexiscan Myoview:    Horizontal ST segment depression ST segment depression of 2 mm was noted during stress in the II, III, aVF, V3, V4, V5 and V6 leads. These changes did not normalize several minutes into recovery.  T wave inversion was noted during stress.  Defect 1: There is a large defect of severe severity present in the mid inferolateral, mid anterolateral, apical anterior, apical inferior, apical lateral and apex location.  Findings consistent with ischemia.  This is a high risk study.  Nuclear stress EF: 29%.  This was reviewed with patient and she was transferred ton [facility] for  cardiac cath. Labs on arrival noted BUN 50, Cr 1.44, Na+ 132. Given findings patient will be admitted for gentle IV hydration with plans for cath in the am pending renal function.  -- will continue IVFs at 50cc/hr with close watch on respiratory status.  -- BMET in the am -- hold lasix, and Entresto with elevated Cr -- NPO at midnight  Signed, Laverda Page, NP-C 02/16/2017, 2:17 PM Pager: [phone]  The patient was seen and examined, and I agree with the history, physical exam, assessment and plan as documented above, with modifications as noted below.  Briefly, 72 year old woman who I recently evaluated in the clinic for evaluation of CHF.  Echocardiogram on 12/31/16 demonstrated severely reduced left ventricular systolic function, LVEF 20-25%,grade 2 diastolic dysfunction with elevated filling pressures, mild left atrial dilatation, mild right ventricular dilatation, moderately reduced right ventricular systolic function, mild to moderate mitral and moderate tricuspid regurgitation.  There was pulmonary hypertension, RVSP 58 mmHg.  The IVC was dilated and did not collapse with respiration.  She had 5 vessel CABG in 2000.  I arrange for a nuclear stress test which was high risk with several large defects which were completely reversible consistent with a large degree of ischemia.  She had diffuse ST segment depressions which persisted into recovery.  For this reason I have arranged for coronary angiography.  Creatinine was noted to be elevated today at 1.44, BUN 50, and a sodium of 132.  For this reason, she will be admitted for pre-cardiac catheterization IV hydration with careful monitoring of her respiratory status.  A repeat basic metabolic panel will be obtained in the morning.  Lasix and Sherryll Burger will be held.   Prentice Docker, MD, [facility]  02/16/2017 2:26 PM

## 2017-02-16 NOTE — Telephone Encounter (Signed)
Patient sent from cardiac rehab for lab slips. Pt is to be sent to [facility] hospital today for cath.

## 2017-02-17 ENCOUNTER — Encounter (HOSPITAL_COMMUNITY): Payer: Self-pay | Admitting: Cardiovascular Disease

## 2017-02-17 ENCOUNTER — Encounter (HOSPITAL_COMMUNITY)
Admission: AD | Disposition: A | Discharge status: Home / Self Care | Payer: Self-pay | Source: Ambulatory Visit | Attending: Cardiovascular Disease

## 2017-02-17 ENCOUNTER — Inpatient Hospital Stay (HOSPITAL_COMMUNITY)
Admission: AD | Disposition: A | Discharge status: Home / Self Care | Payer: Self-pay | Source: Ambulatory Visit | Attending: Cardiovascular Disease

## 2017-02-17 DIAGNOSIS — E785 Hyperlipidemia, unspecified: Secondary | ICD-10-CM | POA: Diagnosis not present

## 2017-02-17 DIAGNOSIS — Z951 Presence of aortocoronary bypass graft: Secondary | ICD-10-CM | POA: Diagnosis not present

## 2017-02-17 DIAGNOSIS — I2510 Atherosclerotic heart disease of native coronary artery without angina pectoris: Secondary | ICD-10-CM | POA: Diagnosis not present

## 2017-02-17 DIAGNOSIS — R079 Chest pain, unspecified: Secondary | ICD-10-CM | POA: Diagnosis not present

## 2017-02-17 DIAGNOSIS — I255 Ischemic cardiomyopathy: Secondary | ICD-10-CM | POA: Diagnosis not present

## 2017-02-17 DIAGNOSIS — N183 Chronic kidney disease, stage 3 (moderate): Secondary | ICD-10-CM | POA: Diagnosis not present

## 2017-02-17 DIAGNOSIS — I130 Hypertensive heart and chronic kidney disease with heart failure and stage 1 through stage 4 chronic kidney disease, or unspecified chronic kidney disease: Secondary | ICD-10-CM | POA: Diagnosis not present

## 2017-02-17 DIAGNOSIS — N289 Disorder of kidney and ureter, unspecified: Secondary | ICD-10-CM | POA: Diagnosis not present

## 2017-02-17 DIAGNOSIS — Z79899 Other long term (current) drug therapy: Secondary | ICD-10-CM | POA: Diagnosis not present

## 2017-02-17 DIAGNOSIS — I5022 Chronic systolic (congestive) heart failure: Secondary | ICD-10-CM

## 2017-02-17 DIAGNOSIS — Z90710 Acquired absence of both cervix and uterus: Secondary | ICD-10-CM | POA: Diagnosis not present

## 2017-02-17 DIAGNOSIS — I25118 Atherosclerotic heart disease of native coronary artery with other forms of angina pectoris: Secondary | ICD-10-CM

## 2017-02-17 DIAGNOSIS — I272 Pulmonary hypertension, unspecified: Secondary | ICD-10-CM | POA: Diagnosis not present

## 2017-02-17 DIAGNOSIS — Z7982 Long term (current) use of aspirin: Secondary | ICD-10-CM | POA: Diagnosis not present

## 2017-02-17 DIAGNOSIS — I25119 Atherosclerotic heart disease of native coronary artery with unspecified angina pectoris: Secondary | ICD-10-CM | POA: Diagnosis not present

## 2017-02-17 DIAGNOSIS — K219 Gastro-esophageal reflux disease without esophagitis: Secondary | ICD-10-CM | POA: Diagnosis not present

## 2017-02-17 DIAGNOSIS — F17200 Nicotine dependence, unspecified, uncomplicated: Secondary | ICD-10-CM | POA: Diagnosis not present

## 2017-02-17 DIAGNOSIS — F17210 Nicotine dependence, cigarettes, uncomplicated: Secondary | ICD-10-CM | POA: Diagnosis not present

## 2017-02-17 DIAGNOSIS — E7800 Pure hypercholesterolemia, unspecified: Secondary | ICD-10-CM | POA: Diagnosis not present

## 2017-02-17 DIAGNOSIS — J449 Chronic obstructive pulmonary disease, unspecified: Secondary | ICD-10-CM | POA: Diagnosis not present

## 2017-02-17 HISTORY — PX: LEFT HEART CATH AND CORS/GRAFTS ANGIOGRAPHY: CATH118250

## 2017-02-17 LAB — BASIC METABOLIC PANEL
Anion gap: 14 (ref 5–15)
BUN: 39 mg/dL — AB (ref 6–20)
CHLORIDE: 101 mmol/L (ref 101–111)
CO2: 21 mmol/L — AB (ref 22–32)
CREATININE: 1.45 mg/dL — AB (ref 0.44–1.00)
Calcium: 8.3 mg/dL — ABNORMAL LOW (ref 8.9–10.3)
GFR calc Af Amer: 40 mL/min — ABNORMAL LOW (ref >60)
GFR calc non Af Amer: 35 mL/min — ABNORMAL LOW (ref >60)
Glucose, Bld: 99 mg/dL (ref 65–99)
Potassium: 4.6 mmol/L (ref 3.5–5.1)
SODIUM: 136 mmol/L (ref 135–145)

## 2017-02-17 LAB — CBC
HCT: 39.8 % (ref 36.0–46.0)
Hemoglobin: 13.4 g/dL (ref 12.0–15.0)
MCH: 32.8 pg (ref 26.0–34.0)
MCHC: 33.7 g/dL (ref 30.0–36.0)
MCV: 97.3 fL (ref 78.0–100.0)
PLATELETS: 200 10*3/uL (ref 150–400)
RBC: 4.09 MIL/uL (ref 3.87–5.11)
RDW: 14.7 % (ref 11.5–15.5)
WBC: 7.8 10*3/uL (ref 4.0–10.5)

## 2017-02-17 LAB — POCT ACTIVATED CLOTTING TIME: Activated Clotting Time: 136 seconds

## 2017-02-17 SURGERY — LEFT HEART CATH AND CORONARY ANGIOGRAPHY
Anesthesia: LOCAL

## 2017-02-17 SURGERY — LEFT HEART CATH AND CORS/GRAFTS ANGIOGRAPHY
Anesthesia: LOCAL

## 2017-02-17 MED ORDER — SODIUM CHLORIDE 0.9 % IV SOLN: INTRAVENOUS | Status: AC

## 2017-02-17 MED ORDER — LIDOCAINE HCL (PF) 1 % IJ SOLN
INTRAMUSCULAR | Status: DC | PRN
  Administered 2017-02-17: 7 mL

## 2017-02-17 MED ORDER — LIDOCAINE HCL (PF) 1 % IJ SOLN
INTRAMUSCULAR | Status: AC
  Filled 2017-02-17: qty 30

## 2017-02-17 MED ORDER — HEPARIN (PORCINE) IN NACL 2-0.9 UNIT/ML-% IJ SOLN
INTRAMUSCULAR | Status: AC | PRN
  Administered 2017-02-17 (×2): 500 mL

## 2017-02-17 MED ORDER — MIDAZOLAM HCL 2 MG/2ML IJ SOLN
INTRAMUSCULAR | Status: AC
  Filled 2017-02-17: qty 2

## 2017-02-17 MED ORDER — SODIUM CHLORIDE 0.9 % IV SOLN
INTRAVENOUS | Status: AC | PRN
  Administered 2017-02-17: 10 mL/h via INTRAVENOUS

## 2017-02-17 MED ORDER — IOHEXOL 350 MG/ML SOLN
INTRAVENOUS | Status: DC | PRN
  Administered 2017-02-17: 105 mL via INTRAVENOUS

## 2017-02-17 MED ORDER — SODIUM CHLORIDE 0.9% FLUSH
3.0000 mL | Freq: Two times a day (BID) | INTRAVENOUS | Status: DC
  Administered 2017-02-18 (×2): 3 mL via INTRAVENOUS

## 2017-02-17 MED ORDER — SODIUM CHLORIDE 0.9% FLUSH: 3.0000 mL | INTRAVENOUS | Status: DC | PRN

## 2017-02-17 MED ORDER — SODIUM CHLORIDE 0.9 % IV SOLN: 250.0000 mL | INTRAVENOUS | Status: DC | PRN

## 2017-02-17 MED ORDER — HEPARIN (PORCINE) IN NACL 2-0.9 UNIT/ML-% IJ SOLN
INTRAMUSCULAR | Status: AC
  Filled 2017-02-17: qty 1000

## 2017-02-17 SURGICAL SUPPLY — 12 items
CATH INFINITI 5 FR IM (CATHETERS) ×2 IMPLANT
CATH INFINITI 5 FR LCB (CATHETERS) ×2 IMPLANT
CATH INFINITI 5FR AL1 (CATHETERS) ×2 IMPLANT
CATH INFINITI 5FR MULTPACK ANG (CATHETERS) ×2 IMPLANT
COVER PRB 48X5XTLSCP FOLD TPE (BAG) ×1 IMPLANT
COVER PROBE 5X48 (BAG) ×1
KIT HEART LEFT (KITS) ×2 IMPLANT
KIT MICROPUNCTURE NIT STIFF (SHEATH) ×2 IMPLANT
PACK CARDIAC CATHETERIZATION (CUSTOM PROCEDURE TRAY) ×2 IMPLANT
SHEATH PINNACLE 5F 10CM (SHEATH) ×2 IMPLANT
TRANSDUCER W/STOPCOCK (MISCELLANEOUS) ×2 IMPLANT
WIRE EMERALD 3MM-J .035X150CM (WIRE) ×2 IMPLANT

## 2017-02-17 NOTE — Progress Notes (Signed)
Progress Note  Patient Name: Susan [name] Date of Encounter: 02/17/2017  Primary Cardiologist: No primary care provider on file.   Subjective   Feels well post cath.  No CHF sx.  Inpatient Medications    Scheduled Meds: . aspirin EC  81 mg Oral Daily  . feeding supplement (ENSURE ENLIVE)  237 mL Oral BID BM  . heparin  5,000 Units Subcutaneous Q8H  . metoprolol succinate  12.5 mg Oral Daily  . rosuvastatin  5 mg Oral Daily  . sodium chloride flush  3 mL Intravenous Q12H   Continuous Infusions: . sodium chloride 50 mL/hr at 02/17/17 1042  . sodium chloride     PRN Meds: sodium chloride, acetaminophen, nitroGLYCERIN, ondansetron (ZOFRAN) IV, sodium chloride flush   Vital Signs    Vitals:   02/17/17 1020 02/17/17 1025 02/17/17 1030 02/17/17 1056  BP: (!) 133/114 101/79 (!) 118/45 (!) 124/56  Pulse: (!) 55  (!) 57 (!) 117  Resp: 20 17 18    Temp:      TempSrc:      SpO2: 98%  99%   Weight:      Height:        Intake/Output Summary (Last 24 hours) at 02/17/2017 1321 Last data filed at 02/17/2017 1130 Gross per 24 hour  Intake 767 ml  Output 1700 ml  Net -933 ml   Filed Weights   02/16/17 1216 02/16/17 1705 02/17/17 0303  Weight: 87 lb (39.5 kg) 79 lb 4.8 oz (36 kg) 83 lb 8 oz (37.9 kg)    Telemetry     Bradycardia- Personally Reviewed  ECG    None today  Physical Exam   GEN: No acute distress.   Neck: No JVD Cardiac: RRR, no murmurs, rubs, or gallops.  Respiratory: Clear to auscultation bilaterally. GI: Soft, nontender, non-distended  MS: No edema; No deformity.  Right groin stable. Neuro:  Nonfocal  Psych: Normal affect   Labs    Chemistry Recent Labs  Lab 02/16/17 1107 02/16/17 1839 02/17/17 0500  NA 132*  --  136  K 4.5  --  4.6  CL 93*  --  101  CO2 27  --  21*  GLUCOSE 116*  --  99  BUN 50*  --  39*  CREATININE 1.44* 1.63* 1.45*  CALCIUM 9.1  --  8.3*  GFRNONAA 35* 30* 35*  GFRAA 41* 35* 40*  ANIONGAP 12  --  14      Hematology Recent Labs  Lab 02/16/17 1107 02/17/17 0500  WBC 10.7* 7.8  RBC 4.67 4.09  HGB 14.8 13.4  HCT 45.5 39.8  MCV 97.4 97.3  MCH 31.7 32.8  MCHC 32.5 33.7  RDW 14.2 14.7  PLT 251 200    Cardiac EnzymesNo results for input(s): TROPONINI in the last 168 hours. No results for input(s): TROPIPOC in the last 168 hours.   BNPNo results for input(s): BNP, PROBNP in the last 168 hours.   DDimer No results for input(s): DDIMER in the last 168 hours.   Radiology    Nm Myocar Multi W/spect W/wall Motion / Ef  Result Date: 02/16/2017  Horizontal ST segment depression ST segment depression of 2 mm was noted during stress in the II, III, aVF, V3, V4, V5 and V6 leads. These changes did not normalize several minutes into recovery.  T wave inversion was noted during stress.  Defect 1: There is a large defect of severe severity present in the mid inferolateral, mid anterolateral, apical anterior, apical  inferior, apical lateral and apex location.  Findings consistent with ischemia.  This is a high risk study.  Nuclear stress EF: 29%.     Cardiac Studies   Cath films reviewed  Patient Profile     72 y.o. 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  PCI would be technically difficult, likely requiring 2 stent technique.  Patient without angina.  I don't think revasc of her distal RCA will make a significant change in her LV function.  Plan for medical therapy unless she has refractory sx.  CRI: Check electrolytes in AM.   For questions or updates, please contact [facility] Please consult [email or web address] for contact info under Cardiology/STEMI.      Signed, Lance Muss, MD  02/17/2017, 1:21 PM

## 2017-02-17 NOTE — Progress Notes (Signed)
Site area: RFA x1 Site Prior to Removal:  Level 0 Pressure Applied For: 20 minutes Manual:   yes Patient Status During Pull:  stable Post Pull Site:  Level 0 Post Pull Instructions Given: yes  Post Pull Pulses Present: palpable Dressing Applied:  tegaderm and gauze Bedrest begins @ 1025 Comments:

## 2017-02-17 NOTE — Progress Notes (Signed)
Initial Nutrition Assessment  DOCUMENTATION CODES:   Non-severe (moderate) malnutrition in context of chronic illness, Underweight  INTERVENTION:   -Ensure Enlive po BID, each supplement provides 350 kcal and 20 grams of protein  NUTRITION DIAGNOSIS:   Moderate Malnutrition related to chronic illness(CHF) as evidenced by mild fat depletion, moderate fat depletion, mild muscle depletion, moderate muscle depletion.  GOAL:   Patient will meet greater than or equal to 90% of their needs  MONITOR:   PO intake, Supplement acceptance, Labs, Weight trends, Skin, I & O's  REASON FOR ASSESSMENT:   Malnutrition Screening Tool    ASSESSMENT:   72 y.o. female with chronic systolic heart failure by echo  Pt admitted with CHF.   Pt s/p heart cath this AM.   Spoke with pt and multiple family members at bedside. Pt reports prior hospitalization for CHF approximately one month ago. Prior to this hospitalization, pt endorses a very poor appetite and significant weight loss, however, pt unable to provide further wt hx details. Since discharge, pt and family have been committed to improving nutritional status and ensure that she eats at least 3 meals per day. Meals consist mainly of chicken and vegetables. Pt also consumes 2-3 snacks per day (peanut butter and fruit or protein powder mixed with whole milk).   Pt reports she has always been petite. UBW is around 113#, however, family reports pt has lost weight gradually over the past several years after retiring. Her family has noticed changes in her appearance and confirm wt loss. She still remains very active.  Discussed with pt importance of good nutritional intake for healing.  Discussed high protein diet with small, frequent meals to assist in optimizing nutritional status. Pt also amenable to Ensure supplements; noted she just consumed one prior to RD visit.   Labs reviewed.   NUTRITION - FOCUSED PHYSICAL EXAM:    Most Recent Value   Orbital Region  No depletion  Upper Arm Region  Moderate depletion  Thoracic and Lumbar Region  Mild depletion  Buccal Region  Mild depletion  Temple Region  Mild depletion  Clavicle Bone Region  Moderate depletion  Clavicle and Acromion Bone Region  Moderate depletion  Scapular Bone Region  Moderate depletion  Dorsal Hand  Mild depletion  Patellar Region  Moderate depletion  Anterior Thigh Region  Moderate depletion  Posterior Calf Region  Moderate depletion  Edema (RD Assessment)  Mild  Hair  Reviewed  Eyes  Reviewed  Mouth  Reviewed  Skin  Reviewed  Nails  Reviewed       Diet Order:  Diet Heart Room service appropriate? Yes; Fluid consistency: Thin  EDUCATION NEEDS:   Education needs have been addressed  Skin:  Skin Assessment: Reviewed RN Assessment  Last BM:  02/16/17  Height:   Ht Readings from Last 1 Encounters:  02/16/17 5' (1.524 m)    Weight:   Wt Readings from Last 1 Encounters:  02/17/17 83 lb 8 oz (37.9 kg)    Ideal Body Weight:  45.5 kg  BMI:  Body mass index is 16.31 kg/m.  Estimated Nutritional Needs:   Kcal:  1100-1300  Protein:  55-70 grams  Fluid:  1.1-1.3 L    Pallas Wahlert A. Mayford Knife, RD, LDN, CDE Pager: [phone] After hours Pager: [phone]

## 2017-02-17 NOTE — Plan of Care (Signed)
  Education: Understanding of cardiac disease, CV risk reduction, and recovery process will improve 02/17/2017 0833 - Completed/Met by Evert Kohl, RN   Activity: Ability to tolerate increased activity will improve 02/17/2017 0833 - Completed/Met by Evert Kohl, RN   Cardiac: Ability to achieve and maintain adequate cardiovascular perfusion will improve 02/17/2017 0833 - Completed/Met by Evert Kohl, RN

## 2017-02-17 NOTE — Interval H&P Note (Signed)
History and Physical Interval Note:  02/17/2017 8:18 AM  Susan [name]  has presented today for cardiac cath with the diagnosis of cardiomyopathy, CAD s/p CABG, abnormal stress test. The various methods of treatment have been discussed with the patient and family. After consideration of risks, benefits and other options for treatment, the patient has consented to  Procedure(s): LEFT HEART CATH AND CORS/GRAFTS ANGIOGRAPHY (N/A) as a surgical intervention .  The patient's history has been reviewed, patient examined, no change in status, stable for surgery.  I have reviewed the patient's chart and labs.  Questions were answered to the patient's satisfaction.    Cath Lab Visit (complete for each Cath Lab visit)  Clinical Evaluation Leading to the Procedure:   ACS: No.  Non-ACS:    Anginal Classification: CCS I  Anti-ischemic medical therapy: Minimal Therapy (1 class of medications)  Non-Invasive Test Results: High-risk stress test findings: cardiac mortality >3%/year  Prior CABG: Previous CABG        Verne Carrow

## 2017-02-17 NOTE — Plan of Care (Signed)
  Activity: Ability to tolerate increased activity will improve 02/17/2017 0833 - Completed/Met by Evert Kohl, RN   Cardiac: Ability to achieve and maintain adequate cardiovascular perfusion will improve 02/17/2017 0833 - Completed/Met by Evert Kohl, RN   Pain Managment: General experience of comfort will improve 02/17/2017 0835 - Completed/Met by Evert Kohl, RN   Education: Understanding of cardiac disease, CV risk reduction, and recovery process will improve 02/17/2017 0833 - Completed/Met by Evert Kohl, RN

## 2017-02-18 ENCOUNTER — Encounter (HOSPITAL_COMMUNITY): Payer: Self-pay | Admitting: Cardiovascular Disease

## 2017-02-18 DIAGNOSIS — Z951 Presence of aortocoronary bypass graft: Secondary | ICD-10-CM

## 2017-02-18 DIAGNOSIS — E785 Hyperlipidemia, unspecified: Secondary | ICD-10-CM | POA: Diagnosis present

## 2017-02-18 DIAGNOSIS — N289 Disorder of kidney and ureter, unspecified: Secondary | ICD-10-CM

## 2017-02-18 DIAGNOSIS — I5189 Other ill-defined heart diseases: Secondary | ICD-10-CM | POA: Diagnosis present

## 2017-02-18 DIAGNOSIS — F17200 Nicotine dependence, unspecified, uncomplicated: Secondary | ICD-10-CM | POA: Diagnosis present

## 2017-02-18 DIAGNOSIS — I25119 Atherosclerotic heart disease of native coronary artery with unspecified angina pectoris: Secondary | ICD-10-CM

## 2017-02-18 DIAGNOSIS — I272 Pulmonary hypertension, unspecified: Secondary | ICD-10-CM | POA: Diagnosis present

## 2017-02-18 LAB — BASIC METABOLIC PANEL
Anion gap: 10 (ref 5–15)
BUN: 30 mg/dL — ABNORMAL HIGH (ref 6–20)
CO2: 23 mmol/L (ref 22–32)
Calcium: 8.2 mg/dL — ABNORMAL LOW (ref 8.9–10.3)
Chloride: 101 mmol/L (ref 101–111)
Creatinine, Ser: 1.21 mg/dL — ABNORMAL HIGH (ref 0.44–1.00)
GFR calc Af Amer: 50 mL/min — ABNORMAL LOW (ref >60)
GFR, EST NON AFRICAN AMERICAN: 43 mL/min — AB (ref >60)
Glucose, Bld: 100 mg/dL — ABNORMAL HIGH (ref 65–99)
POTASSIUM: 4.7 mmol/L (ref 3.5–5.1)
SODIUM: 134 mmol/L — AB (ref 135–145)

## 2017-02-18 LAB — CBC
HEMATOCRIT: 38.5 % (ref 36.0–46.0)
Hemoglobin: 12.7 g/dL (ref 12.0–15.0)
MCH: 32.1 pg (ref 26.0–34.0)
MCHC: 33 g/dL (ref 30.0–36.0)
MCV: 97.2 fL (ref 78.0–100.0)
PLATELETS: 188 10*3/uL (ref 150–400)
RBC: 3.96 MIL/uL (ref 3.87–5.11)
RDW: 14 % (ref 11.5–15.5)
WBC: 7.1 10*3/uL (ref 4.0–10.5)

## 2017-02-18 MED ORDER — NITROGLYCERIN 0.4 MG SL SUBL: 0.4000 mg | SUBLINGUAL_TABLET | SUBLINGUAL | 3 refills | Status: DC | PRN

## 2017-02-18 MED ORDER — MIDAZOLAM HCL 2 MG/2ML IJ SOLN
INTRAMUSCULAR | Status: DC | PRN
  Administered 2017-02-17 (×2): 0.5 mg via INTRAVENOUS

## 2017-02-18 MED ORDER — FUROSEMIDE 40 MG PO TABS: 40.0000 mg | ORAL_TABLET | Freq: Every day | ORAL | 0 refills | Status: DC

## 2017-02-18 MED ORDER — ACETAMINOPHEN 325 MG PO TABS: 650.0000 mg | ORAL_TABLET | Freq: Four times a day (QID) | ORAL | Status: DC | PRN

## 2017-02-18 MED FILL — Heparin Sodium (Porcine) 2 Unit/ML in Sodium Chloride 0.9%: INTRAMUSCULAR | Qty: 1000 | Status: AC

## 2017-02-18 NOTE — Progress Notes (Signed)
Pt discharge instructions reviewed with pt. Pt verbalizes understanding. Pt belongings with pt. Pt is not in distress. Pt's husband and daughter are driving her home.

## 2017-02-18 NOTE — Progress Notes (Signed)
Pt discharged via wheel chair.

## 2017-02-18 NOTE — Discharge Summary (Addendum)
Patient ID: Susan [name],  MRN: [id number], DOB/AGE: [birth year] 72 y.o.  Admit date: 02/16/2017 Discharge date: 02/18/2017  Primary Care Provider: [name], MD Primary Cardiologist: Dr Jerrye Noble  Discharge Diagnoses Principal Problem:   Ischemic cardiomyopathy Active Problems:   Hx of CABG   Diastolic dysfunction   Pulmonary hypertension (HCC)   Smoker   Renal insufficiency   Dyslipidemia    Procedures: Coronary and graft angiogram 02/17/17   Hospital Course 72 y/o female with a history of CAD, s/p CABG x 5 in 2000- no f/u since. She presented to [facility] in [facility] 2018 with CHF and was noted to have an EF of 20-25%. She saw Dr Anastasio Auerbach as an OP in Jan 2019. Myoview was read as high risk and she was admitted for diagnostic cath (pt was not having angina).  She was admitted for diagnostic cath 02/17/17. This revealed 2/5 patent grafts. Her LIMA to LAD was patent and her SVG-RCA was patent with distal disease. The SVG to OM1-OM2 was occluded and the SVG-Dx was occluded. PCI of the distal RCA was considered but Dr Eldridge Dace did not think it would help her LVF and the pt was not having angina. The plan is for aggressive medical Rx. She'll need a TOC OV in [place] in 7-14 days. Her SCr prior to admission was 1.6, it was 1.2 at discharge. Her Lasix and Entresto had been held pre cath and these will be resumed post cath, though we cut her Lasix back to 40 mg daily.    Discharge Vitals:  Blood pressure (!) 118/50, pulse 71, temperature 97.7 F (36.5 C), temperature source Oral, resp. rate 18, height 5' (1.524 m), weight 85 lb 3.2 oz (38.6 kg), SpO2 97 %.    Labs: Results for orders placed or performed during the hospital encounter of 02/16/17 (from the past 24 hour(s))  CBC     Status: None   Collection Time: 02/18/17  4:51 AM  Result Value Ref Range   WBC 7.1 4.0 - 10.5 K/uL   RBC 3.96 3.87 - 5.11 MIL/uL   Hemoglobin 12.7 12.0 - 15.0 g/dL   HCT 40.9 81.1 - 91.4 %   MCV 97.2 78.0 - 100.0 fL   MCH 32.1 26.0 - 34.0 pg   MCHC 33.0 30.0 - 36.0 g/dL   RDW 78.2 95.6 - 21.3 %   Platelets 188 150 - 400 K/uL  Basic metabolic panel     Status: Abnormal   Collection Time: 02/18/17  4:51 AM  Result Value Ref Range   Sodium 134 (L) 135 - 145 mmol/L   Potassium 4.7 3.5 - 5.1 mmol/L   Chloride 101 101 - 111 mmol/L   CO2 23 22 - 32 mmol/L   Glucose, Bld 100 (H) 65 - 99 mg/dL   BUN 30 (H) 6 - 20 mg/dL   Creatinine, Ser 0.86 (H) 0.44 - 1.00 mg/dL   Calcium 8.2 (L) 8.9 - 10.3 mg/dL   GFR calc non Af Amer 43 (L) >60 mL/min   GFR calc Af Amer 50 (L) >60 mL/min   Anion gap 10 5 - 15    Disposition:  Follow-up Information    Randall An M, PA-C Follow up.   Specialties:  Physician Assistant, Cardiology Why:  office will conatct you Contact information: [address] [place] [address] [phone]           Discharge Medications:  Allergies as of 02/18/2017   No Known Allergies  Medication List    TAKE these medications   acetaminophen 325 MG tablet Commonly known as:  TYLENOL Take 2 tablets (650 mg total) by mouth every 6 (six) hours as needed for mild pain or headache.   aspirin EC 81 MG tablet Take 81 mg by mouth daily.   furosemide 40 MG tablet Commonly known as:  LASIX Take 1 tablet (40 mg total) by mouth daily. What changed:  when to take this   KLOR-CON M20 20 MEQ tablet Generic drug:  potassium chloride SA Take 20 mEq by mouth daily.   metoprolol succinate 25 MG 24 hr tablet Commonly known as:  TOPROL-XL Take 12.5 mg by mouth daily.   multivitamin tablet Take 1 tablet by mouth daily.   nitroGLYCERIN 0.4 MG SL tablet Commonly known as:  NITROSTAT Place 1 tablet (0.4 mg total) under the tongue every 5 (five) minutes x 3 doses as needed for chest pain.   rosuvastatin 5 MG tablet Commonly known as:  CRESTOR Take 1 tablet (5 mg total) by mouth daily.   sacubitril-valsartan 49-51 MG Commonly known as:   ENTRESTO Take 1 tablet by mouth 2 (two) times daily.   vitamin B-12 250 MCG tablet Commonly known as:  CYANOCOBALAMIN Take 250 mcg by mouth daily.        Duration of Discharge Encounter: Greater than 30 minutes including physician time.  Signed, Corine Shelter PA-C 02/18/2017 11:05 AM   I have examined the patient and reviewed assessment and plan and discussed with patient.  Agree with above as stated.    Currently without angina.  Only PCI target is the distal RCA/PDA/PLA bifurcation which would require simultaneous kissing stents in the PDA/PLA extending back into the graft, due to the size mismatch between graft and native vessels.  Given lack of sx, plan continue medical therapy for cardiomyopathy. Continue current meds.  Consider nitrates if she has angina and if she has refractory sx, could reconsider PCI.  At this time, the risks seem to outweigh the benefits.    She is on excellent heart failure meds. Renal function improved.  Stage 3 CKD.     Lance Muss

## 2017-02-18 NOTE — Progress Notes (Signed)
Progress Note  Patient Name: Susan [name] Date of Encounter: 02/18/2017  Primary Cardiologist: No primary care provider on file.   Subjective   No CP or SHOB.  Inpatient Medications    Scheduled Meds: . aspirin EC  81 mg Oral Daily  . feeding supplement (ENSURE ENLIVE)  237 mL Oral BID BM  . heparin  5,000 Units Subcutaneous Q8H  . metoprolol succinate  12.5 mg Oral Daily  . rosuvastatin  5 mg Oral Daily  . sodium chloride flush  3 mL Intravenous Q12H   Continuous Infusions: . sodium chloride     PRN Meds: sodium chloride, acetaminophen, nitroGLYCERIN, ondansetron (ZOFRAN) IV, sodium chloride flush   Vital Signs    Vitals:   02/17/17 2016 02/18/17 0000 02/18/17 0544 02/18/17 0944  BP: (!) 97/45 (!) 119/51 (!) 111/49 (!) 118/50  Pulse: 68 60 (!) 57 71  Resp: 20 18    Temp: 98.3 F (36.8 C) 98.1 F (36.7 C) 97.7 F (36.5 C)   TempSrc: Oral Oral Oral   SpO2: 96% 95% 97%   Weight:   85 lb 3.2 oz (38.6 kg)   Height:        Intake/Output Summary (Last 24 hours) at 02/18/2017 0954 Last data filed at 02/18/2017 0700 Gross per 24 hour  Intake 574 ml  Output 1600 ml  Net -1026 ml   Filed Weights   02/16/17 1705 02/17/17 0303 02/18/17 0544  Weight: 79 lb 4.8 oz (36 kg) 83 lb 8 oz (37.9 kg) 85 lb 3.2 oz (38.6 kg)    Telemetry    NSR - Personally Reviewed  ECG    No recent  Physical Exam   GEN: No acute distress.  THin Neck: No JVD Cardiac: RRR, no murmurs, rubs, or gallops.  Respiratory: Clear to auscultation bilaterally. GI: Soft, nontender, non-distended  MS: No edema; No deformity. NO right groin hematoma Neuro:  Nonfocal  Psych: Normal affect   Labs    Chemistry Recent Labs  Lab 02/16/17 1107 02/16/17 1839 02/17/17 0500 02/18/17 0451  NA 132*  --  136 134*  K 4.5  --  4.6 4.7  CL 93*  --  101 101  CO2 27  --  21* 23  GLUCOSE 116*  --  99 100*  BUN 50*  --  39* 30*  CREATININE 1.44* 1.63* 1.45* 1.21*  CALCIUM 9.1  --  8.3* 8.2*    GFRNONAA 35* 30* 35* 43*  GFRAA 41* 35* 40* 50*  ANIONGAP 12  --  14 10     Hematology Recent Labs  Lab 02/16/17 1107 02/17/17 0500 02/18/17 0451  WBC 10.7* 7.8 7.1  RBC 4.67 4.09 3.96  HGB 14.8 13.4 12.7  HCT 45.5 39.8 38.5  MCV 97.4 97.3 97.2  MCH 31.7 32.8 32.1  MCHC 32.5 33.7 33.0  RDW 14.2 14.7 14.0  PLT 251 200 188    Cardiac EnzymesNo results for input(s): TROPONINI in the last 168 hours. No results for input(s): TROPIPOC in the last 168 hours.   BNPNo results for input(s): BNP, PROBNP in the last 168 hours.   DDimer No results for input(s): DDIMER in the last 168 hours.   Radiology    Nm Myocar Multi W/spect W/wall Motion / Ef  Result Date: 02/16/2017  Horizontal ST segment depression ST segment depression of 2 mm was noted during stress in the II, III, aVF, V3, V4, V5 and V6 leads. These changes did not normalize several minutes into recovery.  T  wave inversion was noted during stress.  Defect 1: There is a large defect of severe severity present in the mid inferolateral, mid anterolateral, apical anterior, apical inferior, apical lateral and apex location.  Findings consistent with ischemia.  This is a high risk study.  Nuclear stress EF: 29%.     Cardiac Studies     Patient Profile     72 y.o. female with severe CAD.  Patent LIMA to LAD, severe RCA disease beyond the insertion of the SVG  Assessment & Plan    Currently without angina.  Only PCI target is the distal RCA which would require simultaneous kissing stents in the PDA/PLA extending back into the graft, due to the size mismatch between graft and native vessels.  Given lack of sx, plan continue medical therapy for cardiomyopathy. CONtinue current meds.  CONsider nitrates if she has angina and if she has refractory sx, could reconsider PCI.  At this time, the risks seem to outweigh the benefits.    For questions or updates, please contact [facility] Please consult [email or web address] for contact  info under Cardiology/STEMI.      Signed, Lance Muss, MD  02/18/2017, 9:54 AM

## 2017-02-24 ENCOUNTER — Encounter: Payer: Self-pay | Admitting: Cardiovascular Disease

## 2017-02-24 ENCOUNTER — Ambulatory Visit (INDEPENDENT_AMBULATORY_CARE_PROVIDER_SITE_OTHER): Payer: Medicare Other | Admitting: Cardiovascular Disease

## 2017-02-24 VITALS — BP 96/56 | HR 70 | Ht 60.0 in | Wt 84.8 lb

## 2017-02-24 DIAGNOSIS — I5022 Chronic systolic (congestive) heart failure: Secondary | ICD-10-CM | POA: Diagnosis not present

## 2017-02-24 DIAGNOSIS — Z951 Presence of aortocoronary bypass graft: Secondary | ICD-10-CM

## 2017-02-24 DIAGNOSIS — I519 Heart disease, unspecified: Secondary | ICD-10-CM

## 2017-02-24 DIAGNOSIS — F17201 Nicotine dependence, unspecified, in remission: Secondary | ICD-10-CM

## 2017-02-24 DIAGNOSIS — I25118 Atherosclerotic heart disease of native coronary artery with other forms of angina pectoris: Secondary | ICD-10-CM

## 2017-02-24 DIAGNOSIS — I10 Essential (primary) hypertension: Secondary | ICD-10-CM

## 2017-02-24 NOTE — Progress Notes (Signed)
SUBJECTIVE: The patient presents for follow-up after recent hospitalization and cardiac catheterization.  She underwent a high risk nuclear stress test on 02/16/2017 and underwent coronary angiography on 02/17/17.  Coronary angiography demonstrated 2/5 patent bypass grafts with both the LIMA to LAD and SVG to RCA being patent.  The SVG to RCA had distal disease.  The SVG to OM1-OM 2 was occluded and the SVG to the diagonal was occluded.  PCI of the distal RCA was considered but it was felt that this would be unlikely to improve left ventricular function and as she was asymptomatic, it was felt medical therapy would be best.  It was also mention that it would require simultaneous kissing stents in the PDA/PLA extending back into the graft, due to the size mismatch between graft and native vessels.   Nitrates could be considered if she were to have angina and if symptoms were refractory to medical therapy, PCI could be reconsidered.  Her creatinine prior to admission was 1.6 and was 1.2 at the time of discharge.  Lasix was cut back to 40 mg daily.  She quit smoking.  I congratulated her on this.  She denies chest pain, palpitations, leg swelling, shortness of breath, orthopnea, and paroxysmal nocturnal dyspnea.  She has occasional dizziness but denies syncope.     Review of Systems: As per "subjective", otherwise negative.  No Known Allergies  Current Outpatient Medications  Medication Sig Dispense Refill  . acetaminophen (TYLENOL) 325 MG tablet Take 2 tablets (650 mg total) by mouth every 6 (six) hours as needed for mild pain or headache.    Marland Kitchen aspirin EC 81 MG tablet Take 81 mg by mouth daily.    . furosemide (LASIX) 40 MG tablet Take 1 tablet (40 mg total) by mouth daily. 30 tablet 0  . KLOR-CON M20 20 MEQ tablet Take 20 mEq by mouth daily.  0  . metoprolol succinate (TOPROL-XL) 25 MG 24 hr tablet Take 12.5 mg by mouth daily.  0  . Multiple Vitamin (MULTIVITAMIN) tablet Take 1 tablet  by mouth daily.    . nitroGLYCERIN (NITROSTAT) 0.4 MG SL tablet Place 1 tablet (0.4 mg total) under the tongue every 5 (five) minutes x 3 doses as needed for chest pain. 25 tablet 3  . rosuvastatin (CRESTOR) 5 MG tablet Take 1 tablet (5 mg total) by mouth daily. 30 tablet 6  . sacubitril-valsartan (ENTRESTO) 49-51 MG Take 1 tablet by mouth 2 (two) times daily. 60 tablet 6  . vitamin B-12 (CYANOCOBALAMIN) 250 MCG tablet Take 250 mcg by mouth daily.     No current facility-administered medications for this visit.     Past Medical History:  Diagnosis Date  . Anxiety   . CAD (coronary artery disease)   . CHF (congestive heart failure) (HCC) 12/2016  . Chronic bronchitis (HCC)   . COPD (chronic obstructive pulmonary disease) (HCC)   . Dizziness   . Esophageal reflux   . Essential hypertension   . Fatigue   . Hypercholesteremia   . Hyperkalemia   . Ovarian failure   . SOB (shortness of breath)     Past Surgical History:  Procedure Laterality Date  . ABDOMINAL HYSTERECTOMY    . APPENDECTOMY    . BREAST BIOPSY Right   . CARDIAC CATHETERIZATION  2000  . CORONARY ARTERY BYPASS GRAFT  2000   CABG X5  . LEFT HEART CATH AND CORS/GRAFTS ANGIOGRAPHY N/A 02/17/2017   Procedure: LEFT HEART CATH AND CORS/GRAFTS ANGIOGRAPHY;  Surgeon: Kathleene Hazel, MD;  Location: [facility] INVASIVE CV LAB;  Service: Cardiovascular;  Laterality: N/A;    Social History   Socioeconomic History  . Marital status: Married    Spouse name: Not on file  . Number of children: Not on file  . Years of education: Not on file  . Highest education level: Not on file  Social Needs  . Financial resource strain: Not on file  . Food insecurity - worry: Not on file  . Food insecurity - inability: Not on file  . Transportation needs - medical: Not on file  . Transportation needs - non-medical: Not on file  Occupational History  . Not on file  Tobacco Use  . Smoking status: Former Smoker    Packs/day: 0.50     Years: 50.00    Pack years: 25.00    Types: Cigarettes    Last attempt to quit: 02/22/2017  . Smokeless tobacco: Never Used  Substance and Sexual Activity  . Alcohol use: No    Frequency: Never  . Drug use: No  . Sexual activity: No  Other Topics Concern  . Not on file  Social History Narrative  . Not on file     Vitals:   02/24/17 1039  BP: (!) 96/56  Pulse: 70  SpO2: 96%  Weight: 84 lb 12.8 oz (38.5 kg)  Height: 5' (1.524 m)    Wt Readings from Last 3 Encounters:  02/24/17 84 lb 12.8 oz (38.5 kg)  02/18/17 85 lb 3.2 oz (38.6 kg)  02/07/17 87 lb (39.5 kg)     PHYSICAL EXAM General: NAD HEENT: Normal. Neck: No JVD, no thyromegaly. Lungs:  Diminished throughout, no crackles or wheezes. CV: Regular rate and rhythm, normal S1/S2, no S3/S4, no murmur. No pretibial or periankle edema.  No carotid bruit.   Abdomen: Soft, nontender, no distention.  Neurologic: Alert and oriented.  Psych: Normal affect. Skin: Normal. Musculoskeletal: No gross deformities.    ECG: Most recent ECG reviewed.   Labs: Lab Results  Component Value Date/Time   K 4.7 02/18/2017 04:51 AM   BUN 30 (H) 02/18/2017 04:51 AM   CREATININE 1.21 (H) 02/18/2017 04:51 AM   HGB 12.7 02/18/2017 04:51 AM     Lipids: No results found for: LDLCALC, LDLDIRECT, CHOL, TRIG, HDL     ASSESSMENT AND PLAN:  1.  Chronic systolic heart failure: Symptomatically stable and euvolemic.  Continue Toprol-XL 12.5 mg daily, Entresto 49/51 mg twice daily, and Lasix 40 mg daily.  Cardiac catheterization reviewed.  As there were no good targets for PCI in order to improve left ventricular function, she will continue medical management.  2.  Hypertension: Blood pressure is low normal.  I will continue to monitor.  3.  Coronary artery disease with history of 5 vessel CABG: Symptomatically stable.    Continue aspirin, metoprolol succinate, angiotensin receptor blocker, and Crestor. Cardiac catheterization reviewed.   As there were no good targets for PCI in order to improve left ventricular function, she will continue medical management.  4.  Tobacco abuse disorder: She has stop smoking.  I congratulated her on this.     Disposition: Follow up 10 weeks in [facility] office   Prentice Docker, M.D., F.A.C.C.

## 2017-02-24 NOTE — Patient Instructions (Signed)
Your physician recommends that you schedule a follow-up appointment in: 10 weeks in the [place] office with Dr.Koneswaran   Your physician recommends that you continue on your current medications as directed. Please refer to the Current Medication list given to you today.   If you need a refill on your cardiac medications before your next appointment, please call your pharmacy.     No lab work or tests ordered today.       Thank you for choosing [facility] !

## 2017-03-22 ENCOUNTER — Ambulatory Visit: Payer: Medicare Other | Admitting: Cardiovascular Disease

## 2017-03-22 ENCOUNTER — Telehealth: Payer: Self-pay | Admitting: Cardiovascular Disease

## 2017-03-22 NOTE — Telephone Encounter (Signed)
LM to return call.

## 2017-03-22 NOTE — Telephone Encounter (Signed)
Susan [name] called stating that she continues to have dizzy spells since she was discharged from the hospital.  Dr. Sherryll Burger is her PCP

## 2017-03-23 DIAGNOSIS — I10 Essential (primary) hypertension: Secondary | ICD-10-CM | POA: Diagnosis not present

## 2017-03-23 DIAGNOSIS — J449 Chronic obstructive pulmonary disease, unspecified: Secondary | ICD-10-CM | POA: Diagnosis not present

## 2017-03-23 NOTE — Telephone Encounter (Signed)
Pt c/o dizziness happening several times a week. Denies swelling/chest pain/SOB - doesn't know what BP/HR has been running. Has f/u appt for 4/25 - routed to provider

## 2017-03-24 NOTE — Telephone Encounter (Signed)
Cut back Entresto to 24/26 mg bid to see if this helps improve symptoms.

## 2017-03-24 NOTE — Telephone Encounter (Signed)
Pt aware and voiced understanding - update medication list - pt says she will update [place] on symptoms after med decrease next week.

## 2017-05-05 ENCOUNTER — Other Ambulatory Visit: Payer: Self-pay

## 2017-05-05 ENCOUNTER — Ambulatory Visit (INDEPENDENT_AMBULATORY_CARE_PROVIDER_SITE_OTHER): Payer: Medicare Other | Admitting: Cardiovascular Disease

## 2017-05-05 ENCOUNTER — Encounter: Payer: Self-pay | Admitting: Cardiovascular Disease

## 2017-05-05 VITALS — BP 119/69 | HR 80 | Ht 60.0 in | Wt 84.6 lb

## 2017-05-05 DIAGNOSIS — I10 Essential (primary) hypertension: Secondary | ICD-10-CM

## 2017-05-05 DIAGNOSIS — I5022 Chronic systolic (congestive) heart failure: Secondary | ICD-10-CM

## 2017-05-05 DIAGNOSIS — I519 Heart disease, unspecified: Secondary | ICD-10-CM | POA: Diagnosis not present

## 2017-05-05 DIAGNOSIS — Z720 Tobacco use: Secondary | ICD-10-CM

## 2017-05-05 DIAGNOSIS — I25118 Atherosclerotic heart disease of native coronary artery with other forms of angina pectoris: Secondary | ICD-10-CM

## 2017-05-05 DIAGNOSIS — I5189 Other ill-defined heart diseases: Secondary | ICD-10-CM

## 2017-05-05 DIAGNOSIS — Z951 Presence of aortocoronary bypass graft: Secondary | ICD-10-CM | POA: Diagnosis not present

## 2017-05-05 NOTE — Progress Notes (Signed)
SUBJECTIVE: The patient presents for follow-up of chronic systolic heart failure, hypertension, and coronary artery disease with a history of CABG.  She called in complaining of dizziness in mid March and I reduce the dose of Entresto.  In summary, she underwent a high risk nuclear stress test on 02/16/2017 and underwent coronary angiography on 02/17/17.  Coronary angiography demonstrated 2/5 patent bypass grafts with both the LIMA to LAD and SVG to RCA being patent.  The SVG to RCA had distal disease.  The SVG to OM1-OM 2 was occluded and the SVG to the diagonal was occluded.  PCI of the distal RCA was considered but it was felt that this would be unlikely to improve left ventricular function and as she was asymptomatic, it was felt medical therapy would be best.  It was also mention that it would require simultaneous kissing stents in the PDA/PLA extending back into the graft, due to the size mismatch between graft and native vessels.   Nitrates could be considered if she were to have angina and if symptoms were refractory to medical therapy, PCI could be reconsidered.  An echocardiogram performed at an outside facility on 12/31/16 demonstrated severely reduced left ventricular systolic function, LVEF 20-25%, grade 2 diastolic dysfunction with elevated filling pressures, mild left atrial dilatation, mild right ventricular dilatation, moderately reduced right ventricular systolic function, mild to moderate mitral and moderate tricuspid regurgitation.  There was pulmonary hypertension, RVSP 58 mmHg.  The IVC was dilated and did not collapse with respiration.  After cutting back the dose of Entresto, her dizziness resolved.  She has not had any significant chest pain, shortness of breath, leg swelling, orthopnea, or paroxysmal nocturnal dyspnea.  She had one episode of chest pain when there was a Best boy at her house and she took one nitroglycerin.  She is back to smoking and 2 packs for last  anywhere from 3 to 4 days.    Review of Systems: As per "subjective", otherwise negative.  No Known Allergies  Current Outpatient Medications  Medication Sig Dispense Refill  . acetaminophen (TYLENOL) 325 MG tablet Take 2 tablets (650 mg total) by mouth every 6 (six) hours as needed for mild pain or headache.    Marland Kitchen aspirin EC 81 MG tablet Take 81 mg by mouth daily.    . furosemide (LASIX) 40 MG tablet Take 1 tablet (40 mg total) by mouth daily. 30 tablet 0  . KLOR-CON M20 20 MEQ tablet Take 20 mEq by mouth daily.  0  . metoprolol succinate (TOPROL-XL) 25 MG 24 hr tablet Take 12.5 mg by mouth daily.  0  . Multiple Vitamin (MULTIVITAMIN) tablet Take 1 tablet by mouth daily.    . nitroGLYCERIN (NITROSTAT) 0.4 MG SL tablet Place 1 tablet (0.4 mg total) under the tongue every 5 (five) minutes x 3 doses as needed for chest pain. 25 tablet 3  . rosuvastatin (CRESTOR) 5 MG tablet Take 1 tablet (5 mg total) by mouth daily. 30 tablet 6  . sacubitril-valsartan (ENTRESTO) 24-26 MG Take 1 tablet by mouth 2 (two) times daily.    . vitamin B-12 (CYANOCOBALAMIN) 250 MCG tablet Take 250 mcg by mouth daily.     No current facility-administered medications for this visit.     Past Medical History:  Diagnosis Date  . Anxiety   . CAD (coronary artery disease)   . CHF (congestive heart failure) (HCC) 12/2016  . Chronic bronchitis (HCC)   . COPD (chronic obstructive pulmonary disease) (  HCC)   . Dizziness   . Esophageal reflux   . Essential hypertension   . Fatigue   . Hypercholesteremia   . Hyperkalemia   . Ovarian failure   . SOB (shortness of breath)     Past Surgical History:  Procedure Laterality Date  . ABDOMINAL HYSTERECTOMY    . APPENDECTOMY    . BREAST BIOPSY Right   . CARDIAC CATHETERIZATION  2000  . CORONARY ARTERY BYPASS GRAFT  2000   CABG X5  . LEFT HEART CATH AND CORS/GRAFTS ANGIOGRAPHY N/A 02/17/2017   Procedure: LEFT HEART CATH AND CORS/GRAFTS ANGIOGRAPHY;  Surgeon: Kathleene Hazel, MD;  Location: [facility];  Service: Cardiovascular;  Laterality: N/A;    Social History   Socioeconomic History  . Marital status: Married    Spouse name: Not on file  . Number of children: Not on file  . Years of education: Not on file  . Highest education level: Not on file  Occupational History  . Not on file  Social Needs  . Financial resource strain: Not on file  . Food insecurity:    Worry: Not on file    Inability: Not on file  . Transportation needs:    Medical: Not on file    Non-medical: Not on file  Tobacco Use  . Smoking status: Current Every Day Smoker    Packs/day: 1.00    Years: 50.00    Pack years: 50.00    Types: Cigarettes    Last attempt to quit: 02/22/2017    Years since quitting: 0.1  . Smokeless tobacco: Never Used  Substance and Sexual Activity  . Alcohol use: No    Frequency: Never  . Drug use: No  . Sexual activity: Never  Lifestyle  . Physical activity:    Days per week: Not on file    Minutes per session: Not on file  . Stress: Not on file  Relationships  . Social connections:    Talks on phone: Not on file    Gets together: Not on file    Attends religious service: Not on file    Active member of club or organization: Not on file    Attends meetings of clubs or organizations: Not on file    Relationship status: Not on file  . Intimate partner violence:    Fear of current or ex partner: Not on file    Emotionally abused: Not on file    Physically abused: Not on file    Forced sexual activity: Not on file  Other Topics Concern  . Not on file  Social History Narrative  . Not on file     Vitals:   05/05/17 1015  BP: 119/69  Pulse: 80  SpO2: 97%  Weight: 84 lb 9.6 oz (38.4 kg)  Height: 5' (1.524 m)    Wt Readings from Last 3 Encounters:  05/05/17 84 lb 9.6 oz (38.4 kg)  02/24/17 84 lb 12.8 oz (38.5 kg)  02/18/17 85 lb 3.2 oz (38.6 kg)     PHYSICAL EXAM General: NAD HEENT: Normal. Neck: No JVD,  no thyromegaly. Lungs: Clear to auscultation bilaterally with normal respiratory effort. CV: Regular rate and rhythm, normal S1/S2, no S3/S4, no murmur. No pretibial or periankle edema.  No carotid bruit.   Abdomen: Soft, nontender, no distention.  Neurologic: Alert and oriented.  Psych: Normal affect. Skin: Normal. Musculoskeletal: No gross deformities.    ECG: Most recent ECG reviewed.   Labs: Lab Results  Component Value Date/Time   K 4.7 02/18/2017 04:51 AM   BUN 30 (H) 02/18/2017 04:51 AM   CREATININE 1.21 (H) 02/18/2017 04:51 AM   HGB 12.7 02/18/2017 04:51 AM     Lipids: No results found for: LDLCALC, LDLDIRECT, CHOL, TRIG, HDL     ASSESSMENT AND PLAN:  1. Chronic systolic heart failure: Symptomatically stable and euvolemic.  Continue Toprol-XL 12.5 mg daily, Entresto 24/26 mg twice daily (higher dose led to dizziness), and Lasix 40 mg daily.  Cardiac catheterization reviewed.  As there were no good targets for PCI in order to improve left ventricular function, she will continue medical management. I will obtain a follow-up echocardiogram.  If LVEF remains severely reduced, I will make a referral to EP for AICD consideration.  2. Hypertension: Blood pressure is normal.    No changes to therapy.  3. Coronary artery disease with history of 5 vessel CABG: Symptomatically stable.   Continue aspirin, metoprolol succinate, angiotensin receptor blocker, and Crestor. Cardiac catheterization reviewed.  As there were no good targets for PCI in order to improve left ventricular function, she will continue medical management.  4.  Tobacco abuse disorder: She has resumed smoking and smokes 2 packs every 3 to 4 days.   Disposition: Follow up 3 months   Prentice Docker, M.D., F.A.C.C.

## 2017-05-05 NOTE — Patient Instructions (Signed)
Your physician recommends that you schedule a follow-up appointment in: 3 MONTHS WITH DR KONESWARAN ° °Your physician recommends that you continue on your current medications as directed. Please refer to the Current Medication list given to you today. ° °Your physician has requested that you have an echocardiogram. Echocardiography is a painless test that uses sound waves to create images of your heart. It provides your doctor with information about the size and shape of your heart and how well your heart’s chambers and valves are working. This procedure takes approximately one hour. There are no restrictions for this procedure. ° °Thank you for choosing [facility]!! ° ° ° °

## 2017-05-12 ENCOUNTER — Other Ambulatory Visit: Payer: Medicare Other

## 2017-05-16 DIAGNOSIS — J449 Chronic obstructive pulmonary disease, unspecified: Secondary | ICD-10-CM | POA: Diagnosis not present

## 2017-05-16 DIAGNOSIS — I10 Essential (primary) hypertension: Secondary | ICD-10-CM | POA: Diagnosis not present

## 2017-06-08 ENCOUNTER — Other Ambulatory Visit: Payer: Self-pay

## 2017-06-08 ENCOUNTER — Ambulatory Visit (INDEPENDENT_AMBULATORY_CARE_PROVIDER_SITE_OTHER): Payer: Medicare Other

## 2017-06-08 ENCOUNTER — Telehealth: Payer: Self-pay | Admitting: *Deleted

## 2017-06-08 DIAGNOSIS — I5022 Chronic systolic (congestive) heart failure: Secondary | ICD-10-CM | POA: Diagnosis not present

## 2017-06-08 NOTE — Telephone Encounter (Signed)
Notes recorded by Lesle Chris, LPN on 9/89/2119 at 4:13 PM EDT Patient notified. Copy to pmd. Follow up scheduled for August with Dr. Purvis Sheffield.   ------  Notes recorded by Laqueta Linden, MD on 06/08/2017 at 1:47 PM EDT Mildly reduced pumping function. Moderate valve leakage. This is a marked improvement from December 2018

## 2017-06-29 ENCOUNTER — Encounter: Payer: Self-pay | Admitting: Cardiovascular Disease

## 2017-06-29 ENCOUNTER — Ambulatory Visit (INDEPENDENT_AMBULATORY_CARE_PROVIDER_SITE_OTHER): Payer: Medicare Other | Admitting: Cardiovascular Disease

## 2017-06-29 VITALS — BP 120/80 | HR 85 | Ht 60.0 in | Wt 86.6 lb

## 2017-06-29 DIAGNOSIS — I10 Essential (primary) hypertension: Secondary | ICD-10-CM | POA: Diagnosis not present

## 2017-06-29 DIAGNOSIS — Z720 Tobacco use: Secondary | ICD-10-CM

## 2017-06-29 DIAGNOSIS — I255 Ischemic cardiomyopathy: Secondary | ICD-10-CM | POA: Diagnosis not present

## 2017-06-29 DIAGNOSIS — I25118 Atherosclerotic heart disease of native coronary artery with other forms of angina pectoris: Secondary | ICD-10-CM | POA: Diagnosis not present

## 2017-06-29 DIAGNOSIS — Z951 Presence of aortocoronary bypass graft: Secondary | ICD-10-CM | POA: Diagnosis not present

## 2017-06-29 DIAGNOSIS — I5022 Chronic systolic (congestive) heart failure: Secondary | ICD-10-CM | POA: Diagnosis not present

## 2017-06-29 MED ORDER — METOPROLOL SUCCINATE ER 25 MG PO TB24: 12.5000 mg | ORAL_TABLET | Freq: Every day | ORAL | 3 refills | Status: DC

## 2017-06-29 MED ORDER — ROSUVASTATIN CALCIUM 5 MG PO TABS: 5.0000 mg | ORAL_TABLET | Freq: Every day | ORAL | 3 refills | Status: DC

## 2017-06-29 NOTE — Progress Notes (Signed)
SUBJECTIVE: The patient presents for follow-up of chronic systolic heart failure, hypertension, and coronary artery disease with a history of CABG.  In summary, she underwent a high risk nuclear stress test on 02/16/2017 and underwent coronary angiography on 02/17/17.  Coronary angiography demonstrated 2/5 patent bypass grafts with both the LIMA to LAD and SVG to RCA being patent. The SVG to RCA had distal disease. The SVG to OM1-OM 2 was occluded and the SVG to the diagonal was occluded. PCI of the distal RCA was considered but it was felt that this would be unlikely to improve left ventricular function and as she was asymptomatic, it was felt medical therapy would be best. It was also mention that it would require simultaneous kissing stents in the PDA/PLA extending back into the graft, due to the size mismatch between graft and native vessels.  Nitrates could be considered if she were to have angina and if symptoms were refractory to medical therapy, PCI could be reconsidered.  An echocardiogram performed at an outside facility on 12/31/16 demonstrated severely reduced left ventricular systolic function, LVEF 20-25%, grade 2 diastolic dysfunction with elevated filling pressures, mild left atrial dilatation, mild right ventricular dilatation, moderately reduced right ventricular systolic function, mild to moderate mitral and moderate tricuspid regurgitation. There was pulmonary hypertension, RVSP 58 mmHg. The IVC was dilated and did not collapse with respiration.  I obtained a follow-up echocardiogram performed on 06/08/2017 which demonstrated mildly reduced left ventricular systolic function, LVEF 45 to 41%, grade 1 diastolic dysfunction with high ventricular filling pressures, and moderate tricuspid regurgitation with a PA peak pressure of 32 mmHg.  For reasons unbeknownst to her, she is not taking Entresto, metoprolol succinate, or rosuvastatin.  She is not taking lisinopril 10 mg daily  and Lasix 20 mg twice daily.  She is feeling well today and denies chest pain, palpitations, leg swelling, and shortness of breath.  She has occasional back pain.  She is not sure how or when her medications were changed.  She has not been recently hospitalized nor has she seen her PCP since she last saw me.  I personally reviewed the ECG performed today which demonstrates sinus rhythm with frequent PACs and nonspecific ST segment and T wave abnormalities in lateral leads.      Review of Systems: As per "subjective", otherwise negative.  No Known Allergies  Current Outpatient Medications  Medication Sig Dispense Refill  . acetaminophen (TYLENOL) 325 MG tablet Take 2 tablets (650 mg total) by mouth every 6 (six) hours as needed for mild pain or headache.    Marland Kitchen aspirin EC 81 MG tablet Take 81 mg by mouth daily.    . furosemide (LASIX) 40 MG tablet Take 1 tablet (40 mg total) by mouth daily. 30 tablet 0  . KLOR-CON M20 20 MEQ tablet Take 20 mEq by mouth daily.  0  . lisinopril (PRINIVIL,ZESTRIL) 10 MG tablet Take 10 mg by mouth daily.    . metoprolol succinate (TOPROL-XL) 25 MG 24 hr tablet Take 12.5 mg by mouth daily.  0  . Multiple Vitamin (MULTIVITAMIN) tablet Take 1 tablet by mouth daily.    . nitroGLYCERIN (NITROSTAT) 0.4 MG SL tablet Place 1 tablet (0.4 mg total) under the tongue every 5 (five) minutes x 3 doses as needed for chest pain. 25 tablet 3  . sacubitril-valsartan (ENTRESTO) 24-26 MG Take 1 tablet by mouth 2 (two) times daily.    . vitamin B-12 (CYANOCOBALAMIN) 250 MCG tablet Take 250 mcg  by mouth daily.    . rosuvastatin (CRESTOR) 5 MG tablet Take 1 tablet (5 mg total) by mouth daily. 30 tablet 6   No current facility-administered medications for this visit.     Past Medical History:  Diagnosis Date  . Anxiety   . CAD (coronary artery disease)   . CHF (congestive heart failure) (HCC) 12/2016  . Chronic bronchitis (HCC)   . COPD (chronic obstructive pulmonary disease)  (HCC)   . Dizziness   . Esophageal reflux   . Essential hypertension   . Fatigue   . Hypercholesteremia   . Hyperkalemia   . Ovarian failure   . SOB (shortness of breath)     Past Surgical History:  Procedure Laterality Date  . ABDOMINAL HYSTERECTOMY    . APPENDECTOMY    . BREAST BIOPSY Right   . CARDIAC CATHETERIZATION  2000  . CORONARY ARTERY BYPASS GRAFT  2000   CABG X5  . LEFT HEART CATH AND CORS/GRAFTS ANGIOGRAPHY N/A 02/17/2017   Procedure: LEFT HEART CATH AND CORS/GRAFTS ANGIOGRAPHY;  Surgeon: Kathleene Hazel, MD;  Location: [facility];  Service: Cardiovascular;  Laterality: N/A;    Social History   Socioeconomic History  . Marital status: Married    Spouse name: Not on file  . Number of children: Not on file  . Years of education: Not on file  . Highest education level: Not on file  Occupational History  . Not on file  Social Needs  . Financial resource strain: Not on file  . Food insecurity:    Worry: Not on file    Inability: Not on file  . Transportation needs:    Medical: Not on file    Non-medical: Not on file  Tobacco Use  . Smoking status: Current Every Day Smoker    Packs/day: 1.00    Years: 50.00    Pack years: 50.00    Types: Cigarettes    Last attempt to quit: 02/22/2017    Years since quitting: 0.3  . Smokeless tobacco: Never Used  Substance and Sexual Activity  . Alcohol use: No    Frequency: Never  . Drug use: No  . Sexual activity: Never  Lifestyle  . Physical activity:    Days per week: Not on file    Minutes per session: Not on file  . Stress: Not on file  Relationships  . Social connections:    Talks on phone: Not on file    Gets together: Not on file    Attends religious service: Not on file    Active member of club or organization: Not on file    Attends meetings of clubs or organizations: Not on file    Relationship status: Not on file  . Intimate partner violence:    Fear of current or ex partner: Not on  file    Emotionally abused: Not on file    Physically abused: Not on file    Forced sexual activity: Not on file  Other Topics Concern  . Not on file  Social History Narrative  . Not on file     Vitals:   06/29/17 0833  BP: 120/80  Pulse: 85  SpO2: 98%  Weight: 86 lb 9.6 oz (39.3 kg)  Height: 5' (1.524 m)    Wt Readings from Last 3 Encounters:  06/29/17 86 lb 9.6 oz (39.3 kg)  05/05/17 84 lb 9.6 oz (38.4 kg)  02/24/17 84 lb 12.8 oz (38.5 kg)  PHYSICAL EXAM General: NAD HEENT: Normal. Neck: No JVD, no thyromegaly. Lungs: Clear to auscultation bilaterally with normal respiratory effort. CV: Regular rate and irregular rhythm with frequent premature contractions, normal S1/S2, no S3/S4, no murmur. No pretibial or periankle edema.     Abdomen: Soft, nontender, no distention.  Neurologic: Alert and oriented.  Psych: Normal affect. Skin: Normal. Musculoskeletal: No gross deformities.    ECG: Most recent ECG reviewed.   Labs: Lab Results  Component Value Date/Time   K 4.7 02/18/2017 04:51 AM   BUN 30 (H) 02/18/2017 04:51 AM   CREATININE 1.21 (H) 02/18/2017 04:51 AM   HGB 12.7 02/18/2017 04:51 AM     Lipids: No results found for: LDLCALC, LDLDIRECT, CHOL, TRIG, HDL     ASSESSMENT AND PLAN: 1. Chronic systolic heart failure:Symptomatically stable and euvolemic.  Follow-up echocardiogram reviewed above with marked improvement in left ventricular ejection fraction, EF now 45 to 50%.   As stated above, she is not taking either Entresto or Toprol-XL.  She is taking lisinopril 10 mg daily.  I will continue this and resume Toprol-XL 12.5 mg daily.  She takes Lasix 20 mg twice daily.  I reminded her to take supplemental potassium as well. Cardiac catheterization reviewed above.  As there were no good targets for PCI, she will continue medical management.  2. Hypertension:Blood pressure is controlled.  I will monitor given resumption of Toprol-XL.  3. Coronary  artery disease with history of 5 vessel CABG: Symptomatically stable.  As stated above, she is neither taking metoprolol succinate nor rosuvastatin.  I will resume both.  Continue aspirin 81 mg daily and lisinopril 10 mg daily.  4.  Tobacco abuse disorder: She continues to smoke.      Disposition: Follow up 6 months   Prentice Docker, M.D., F.A.C.C.

## 2017-06-29 NOTE — Patient Instructions (Signed)
Medication Instructions:   Your physician has recommended you make the following change in your medication:   Stop entresto.  Restart crestor 5 mg by mouth daily.  Restart metoprolol succinate 12.5 mg by mouth daily.  Take aspirin 81 mg by mouth daily. (No extra aspirin doses)  Continue all other medications the same.  Labwork:  NONE  Testing/Procedures:  NONE  Follow-Up:  Your physician recommends that you schedule a follow-up appointment in: 6 months. You will receive a reminder letter in the mail in about 4 months reminding you to call and schedule your appointment. If you don't receive this letter, please contact our office.  Any Other Special Instructions Will Be Listed Below (If Applicable).  If you need a refill on your cardiac medications before your next appointment, please call your pharmacy.

## 2017-07-26 DIAGNOSIS — I10 Essential (primary) hypertension: Secondary | ICD-10-CM | POA: Diagnosis not present

## 2017-07-26 DIAGNOSIS — J449 Chronic obstructive pulmonary disease, unspecified: Secondary | ICD-10-CM | POA: Diagnosis not present

## 2017-08-17 ENCOUNTER — Ambulatory Visit: Payer: Medicare Other | Admitting: Cardiovascular Disease

## 2017-08-29 ENCOUNTER — Other Ambulatory Visit: Payer: Self-pay

## 2017-08-29 MED ORDER — KLOR-CON M20 20 MEQ PO TBCR: 20.0000 meq | EXTENDED_RELEASE_TABLET | Freq: Every day | ORAL | 1 refills | Status: DC

## 2017-08-29 MED ORDER — LISINOPRIL 10 MG PO TABS: 10.0000 mg | ORAL_TABLET | Freq: Every day | ORAL | 1 refills | Status: DC

## 2017-09-07 DIAGNOSIS — I10 Essential (primary) hypertension: Secondary | ICD-10-CM | POA: Diagnosis not present

## 2017-09-07 DIAGNOSIS — J449 Chronic obstructive pulmonary disease, unspecified: Secondary | ICD-10-CM | POA: Diagnosis not present

## 2017-11-02 DIAGNOSIS — J449 Chronic obstructive pulmonary disease, unspecified: Secondary | ICD-10-CM | POA: Diagnosis not present

## 2017-11-02 DIAGNOSIS — I10 Essential (primary) hypertension: Secondary | ICD-10-CM | POA: Diagnosis not present

## 2017-11-30 DIAGNOSIS — J449 Chronic obstructive pulmonary disease, unspecified: Secondary | ICD-10-CM | POA: Diagnosis not present

## 2017-11-30 DIAGNOSIS — I10 Essential (primary) hypertension: Secondary | ICD-10-CM | POA: Diagnosis not present

## 2017-12-28 DIAGNOSIS — I10 Essential (primary) hypertension: Secondary | ICD-10-CM | POA: Diagnosis not present

## 2017-12-28 DIAGNOSIS — J449 Chronic obstructive pulmonary disease, unspecified: Secondary | ICD-10-CM | POA: Diagnosis not present

## 2017-12-30 DIAGNOSIS — F17210 Nicotine dependence, cigarettes, uncomplicated: Secondary | ICD-10-CM | POA: Diagnosis not present

## 2017-12-30 DIAGNOSIS — J449 Chronic obstructive pulmonary disease, unspecified: Secondary | ICD-10-CM | POA: Diagnosis not present

## 2017-12-30 DIAGNOSIS — R0602 Shortness of breath: Secondary | ICD-10-CM | POA: Diagnosis not present

## 2017-12-30 DIAGNOSIS — J441 Chronic obstructive pulmonary disease with (acute) exacerbation: Secondary | ICD-10-CM | POA: Diagnosis not present

## 2017-12-30 DIAGNOSIS — R05 Cough: Secondary | ICD-10-CM | POA: Diagnosis not present

## 2017-12-30 DIAGNOSIS — I10 Essential (primary) hypertension: Secondary | ICD-10-CM | POA: Diagnosis not present

## 2017-12-30 DIAGNOSIS — Z299 Encounter for prophylactic measures, unspecified: Secondary | ICD-10-CM | POA: Diagnosis not present

## 2017-12-30 DIAGNOSIS — Z2821 Immunization not carried out because of patient refusal: Secondary | ICD-10-CM | POA: Diagnosis not present

## 2017-12-30 DIAGNOSIS — Z681 Body mass index (BMI) 19 or less, adult: Secondary | ICD-10-CM | POA: Diagnosis not present

## 2018-01-02 DIAGNOSIS — R0602 Shortness of breath: Secondary | ICD-10-CM | POA: Diagnosis not present

## 2018-01-02 DIAGNOSIS — F17200 Nicotine dependence, unspecified, uncomplicated: Secondary | ICD-10-CM | POA: Diagnosis not present

## 2018-01-02 DIAGNOSIS — Z720 Tobacco use: Secondary | ICD-10-CM | POA: Diagnosis not present

## 2018-01-02 DIAGNOSIS — I10 Essential (primary) hypertension: Secondary | ICD-10-CM | POA: Diagnosis not present

## 2018-01-02 DIAGNOSIS — J441 Chronic obstructive pulmonary disease with (acute) exacerbation: Secondary | ICD-10-CM | POA: Diagnosis not present

## 2018-01-02 DIAGNOSIS — Z79899 Other long term (current) drug therapy: Secondary | ICD-10-CM | POA: Diagnosis not present

## 2018-01-02 DIAGNOSIS — Z7982 Long term (current) use of aspirin: Secondary | ICD-10-CM | POA: Diagnosis not present

## 2018-01-13 DIAGNOSIS — R7989 Other specified abnormal findings of blood chemistry: Secondary | ICD-10-CM | POA: Diagnosis not present

## 2018-01-13 DIAGNOSIS — Z681 Body mass index (BMI) 19 or less, adult: Secondary | ICD-10-CM | POA: Diagnosis not present

## 2018-01-13 DIAGNOSIS — R9389 Abnormal findings on diagnostic imaging of other specified body structures: Secondary | ICD-10-CM | POA: Diagnosis not present

## 2018-01-13 DIAGNOSIS — I10 Essential (primary) hypertension: Secondary | ICD-10-CM | POA: Diagnosis not present

## 2018-01-13 DIAGNOSIS — I5022 Chronic systolic (congestive) heart failure: Secondary | ICD-10-CM | POA: Diagnosis not present

## 2018-01-13 DIAGNOSIS — F17210 Nicotine dependence, cigarettes, uncomplicated: Secondary | ICD-10-CM | POA: Diagnosis not present

## 2018-01-13 DIAGNOSIS — J449 Chronic obstructive pulmonary disease, unspecified: Secondary | ICD-10-CM | POA: Diagnosis not present

## 2018-01-13 DIAGNOSIS — Z299 Encounter for prophylactic measures, unspecified: Secondary | ICD-10-CM | POA: Diagnosis not present

## 2018-01-16 DIAGNOSIS — I10 Essential (primary) hypertension: Secondary | ICD-10-CM | POA: Diagnosis not present

## 2018-01-16 DIAGNOSIS — J439 Emphysema, unspecified: Secondary | ICD-10-CM | POA: Diagnosis not present

## 2018-01-16 DIAGNOSIS — J8401 Alveolar proteinosis: Secondary | ICD-10-CM | POA: Diagnosis not present

## 2018-01-16 DIAGNOSIS — J449 Chronic obstructive pulmonary disease, unspecified: Secondary | ICD-10-CM | POA: Diagnosis not present

## 2018-01-16 DIAGNOSIS — R9389 Abnormal findings on diagnostic imaging of other specified body structures: Secondary | ICD-10-CM | POA: Diagnosis not present

## 2018-01-16 DIAGNOSIS — Z681 Body mass index (BMI) 19 or less, adult: Secondary | ICD-10-CM | POA: Diagnosis not present

## 2018-01-16 DIAGNOSIS — I5022 Chronic systolic (congestive) heart failure: Secondary | ICD-10-CM | POA: Diagnosis not present

## 2018-01-16 DIAGNOSIS — Z299 Encounter for prophylactic measures, unspecified: Secondary | ICD-10-CM | POA: Diagnosis not present

## 2018-01-16 DIAGNOSIS — I700 Atherosclerosis of aorta: Secondary | ICD-10-CM | POA: Diagnosis not present

## 2018-01-16 DIAGNOSIS — R911 Solitary pulmonary nodule: Secondary | ICD-10-CM | POA: Diagnosis not present

## 2018-01-24 DIAGNOSIS — J449 Chronic obstructive pulmonary disease, unspecified: Secondary | ICD-10-CM | POA: Diagnosis not present

## 2018-01-24 DIAGNOSIS — I10 Essential (primary) hypertension: Secondary | ICD-10-CM | POA: Diagnosis not present

## 2018-02-01 ENCOUNTER — Ambulatory Visit (INDEPENDENT_AMBULATORY_CARE_PROVIDER_SITE_OTHER): Payer: Medicare Other | Admitting: Cardiovascular Disease

## 2018-02-01 ENCOUNTER — Encounter: Payer: Self-pay | Admitting: Cardiovascular Disease

## 2018-02-01 VITALS — BP 100/60 | HR 98 | Ht 60.0 in | Wt 82.0 lb

## 2018-02-01 DIAGNOSIS — I10 Essential (primary) hypertension: Secondary | ICD-10-CM

## 2018-02-01 DIAGNOSIS — I5022 Chronic systolic (congestive) heart failure: Secondary | ICD-10-CM

## 2018-02-01 DIAGNOSIS — Z720 Tobacco use: Secondary | ICD-10-CM | POA: Diagnosis not present

## 2018-02-01 DIAGNOSIS — I255 Ischemic cardiomyopathy: Secondary | ICD-10-CM | POA: Diagnosis not present

## 2018-02-01 DIAGNOSIS — I25708 Atherosclerosis of coronary artery bypass graft(s), unspecified, with other forms of angina pectoris: Secondary | ICD-10-CM | POA: Diagnosis not present

## 2018-02-01 DIAGNOSIS — R42 Dizziness and giddiness: Secondary | ICD-10-CM

## 2018-02-01 MED ORDER — LISINOPRIL 5 MG PO TABS: 5.0000 mg | ORAL_TABLET | Freq: Every day | ORAL | 3 refills | Status: DC

## 2018-02-01 NOTE — Patient Instructions (Addendum)
Medication Instructions:   Your physician has recommended you make the following change in your medication:   Decrease lisinopril to 5 mg by mouth daily  Continue all other medications the same  Labwork:  NONE  Testing/Procedures:  NONE  Follow-Up:  Your physician recommends that you schedule a follow-up appointment in: 6 months. You will receive a reminder letter in the mail in about 4 months reminding you to call and schedule your appointment. If you don't receive this letter, please contact our office.  Any Other Special Instructions Will Be Listed Below (If Applicable).  If you need a refill on your cardiac medications before your next appointment, please call your pharmacy.

## 2018-02-01 NOTE — Progress Notes (Addendum)
SUBJECTIVE: The patient presents for follow-up of chronic systolic heart failure, hypertension, and coronary artery disease with a history of CABG.  He is here with her husband.  In summary, she underwent a high risk nuclear stress test on 02/16/2017 and underwent coronary angiography on 02/17/17.  Coronary angiography demonstrated 2/5 patent bypass grafts with both the LIMA to LAD and SVG to RCA being patent. The SVG to RCA had distal disease. The SVG to OM1-OM 2 was occluded and the SVG to the diagonal was occluded. PCI of the distal RCA was considered but it was felt that this would be unlikely to improve left ventricular function and as she was asymptomatic, it was felt medical therapy would be best. It was also mention that it would require simultaneous kissing stents in the PDA/PLA extending back into the graft, due to the size mismatch between graft and native vessels.  Nitrates could be considered if she were to have angina and if symptoms were refractory to medical therapy, PCI could be reconsidered.  An echocardiogram performed at an outside facility on 12/31/16 demonstrated severely reduced left ventricular systolic function, LVEF 20-25%, grade 2 diastolic dysfunction with elevated filling pressures, mild left atrial dilatation, mild right ventricular dilatation, moderately reduced right ventricular systolic function, mild to moderate mitral and moderate tricuspid regurgitation. There was pulmonary hypertension, RVSP 58 mmHg. The IVC was dilated and did not collapse with respiration.  I obtained a follow-up echocardiogram performed on 06/08/2017 which demonstrated mildly reduced left ventricular systolic function, LVEF 45 to 04%50%, grade 1 diastolic dysfunction with high ventricular filling pressures, and moderate tricuspid regurgitation with a PA peak pressure of 32 mmHg.  She was recently treated for pneumonia by her PCP.  She has some residual cough.  Her breathing has  improved.  She denies exertional chest pain.  She has been lightheaded and dizzy when going from the seated to standing position for the past week.  If she stands for a few minutes her symptoms resolved.  She denies syncope.  She tried Chantix but said it made her sick.  She is down to smoking 3 cigarettes.    Review of Systems: As per "subjective", otherwise negative.  No Known Allergies  Current Outpatient Medications  Medication Sig Dispense Refill  . acetaminophen (TYLENOL) 325 MG tablet Take 2 tablets (650 mg total) by mouth every 6 (six) hours as needed for mild pain or headache.    Marland Kitchen. aspirin EC 81 MG tablet Take 81 mg by mouth daily.    . furosemide (LASIX) 40 MG tablet Take 1 tablet (40 mg total) by mouth daily. 30 tablet 0  . KLOR-CON M20 20 MEQ tablet Take 1 tablet (20 mEq total) by mouth daily. 90 tablet 1  . lisinopril (PRINIVIL,ZESTRIL) 10 MG tablet Take 1 tablet (10 mg total) by mouth daily. 90 tablet 1  . metoprolol succinate (TOPROL-XL) 25 MG 24 hr tablet Take 0.5 tablets (12.5 mg total) by mouth daily. 45 tablet 3  . Multiple Vitamin (MULTIVITAMIN) tablet Take 1 tablet by mouth daily.    . nitroGLYCERIN (NITROSTAT) 0.4 MG SL tablet Place 1 tablet (0.4 mg total) under the tongue every 5 (five) minutes x 3 doses as needed for chest pain. 25 tablet 3  . vitamin B-12 (CYANOCOBALAMIN) 250 MCG tablet Take 250 mcg by mouth daily.    . rosuvastatin (CRESTOR) 5 MG tablet Take 1 tablet (5 mg total) by mouth daily. 90 tablet 3   No current facility-administered medications  for this visit.     Past Medical History:  Diagnosis Date  . Anxiety   . CAD (coronary artery disease)   . CHF (congestive heart failure) (HCC) 12/2016  . Chronic bronchitis (HCC)   . COPD (chronic obstructive pulmonary disease) (HCC)   . Dizziness   . Esophageal reflux   . Essential hypertension   . Fatigue   . Hypercholesteremia   . Hyperkalemia   . Ovarian failure   . SOB (shortness of breath)      Past Surgical History:  Procedure Laterality Date  . ABDOMINAL HYSTERECTOMY    . APPENDECTOMY    . BREAST BIOPSY Right   . CARDIAC CATHETERIZATION  2000  . CORONARY ARTERY BYPASS GRAFT  2000   CABG X5  . LEFT HEART CATH AND CORS/GRAFTS ANGIOGRAPHY N/A 02/17/2017   Procedure: LEFT HEART CATH AND CORS/GRAFTS ANGIOGRAPHY;  Surgeon: Kathleene Hazel, MD;  Location: [facility];  Service: Cardiovascular;  Laterality: N/A;    Social History   Socioeconomic History  . Marital status: Married    Spouse name: Not on file  . Number of children: Not on file  . Years of education: Not on file  . Highest education level: Not on file  Occupational History  . Not on file  Social Needs  . Financial resource strain: Not on file  . Food insecurity:    Worry: Not on file    Inability: Not on file  . Transportation needs:    Medical: Not on file    Non-medical: Not on file  Tobacco Use  . Smoking status: Current Every Day Smoker    Packs/day: 1.00    Years: 50.00    Pack years: 50.00    Types: Cigarettes    Last attempt to quit: 02/22/2017    Years since quitting: 0.9  . Smokeless tobacco: Never Used  Substance and Sexual Activity  . Alcohol use: No    Frequency: Never  . Drug use: No  . Sexual activity: Never  Lifestyle  . Physical activity:    Days per week: Not on file    Minutes per session: Not on file  . Stress: Not on file  Relationships  . Social connections:    Talks on phone: Not on file    Gets together: Not on file    Attends religious service: Not on file    Active member of club or organization: Not on file    Attends meetings of clubs or organizations: Not on file    Relationship status: Not on file  . Intimate partner violence:    Fear of current or ex partner: Not on file    Emotionally abused: Not on file    Physically abused: Not on file    Forced sexual activity: Not on file  Other Topics Concern  . Not on file  Social History Narrative   . Not on file     Vitals:   02/01/18 1514  BP: 100/60  Pulse: 98  SpO2: (!) 89%  Weight: 82 lb (37.2 kg)  Height: 5' (1.524 m)    Wt Readings from Last 3 Encounters:  02/01/18 82 lb (37.2 kg)  06/29/17 86 lb 9.6 oz (39.3 kg)  05/05/17 84 lb 9.6 oz (38.4 kg)     PHYSICAL EXAM General: NAD HEENT: Normal. Neck: No JVD, no thyromegaly. Lungs: Clear to auscultation bilaterally with normal respiratory effort. CV: Regular rate and rhythm, normal S1/S2, no S3/S4, no murmur. No pretibial  or periankle edema.  No carotid bruit.   Abdomen: Soft, nontender, no distention.  Neurologic: Alert and oriented.  Psych: Normal affect. Skin: Normal. Musculoskeletal: No gross deformities.    ECG: Reviewed above under Subjective   Labs: Lab Results  Component Value Date/Time   K 4.7 02/18/2017 04:51 AM   BUN 30 (H) 02/18/2017 04:51 AM   CREATININE 1.21 (H) 02/18/2017 04:51 AM   HGB 12.7 02/18/2017 04:51 AM     Lipids: No results found for: LDLCALC, LDLDIRECT, CHOL, TRIG, HDL     ASSESSMENT AND PLAN: 1. Chronic systolic heart failure:She appears euvolemic. Follow-up echocardiogram reviewed above with marked improvement in left ventricular ejection fraction, EF now 45 to 50%.   Currently on Toprol-XL 12.5 mg daily and lisinopril 10 mg daily.  Due to hypotension and dizziness, I will reduce lisinopril to 5 mg daily.  She takes Lasix 20 mg twice daily with supplemental potassium. Cardiac catheterization reviewed above.  As there were no good targets for PCI, she will continue medical management.  2. Hypertension:Blood pressure is low normal and she is dizzy.  I will reduce lisinopril to 5 mg daily.  3. Coronary artery disease with history of 5 vessel CABG: Symptomatically stable.  Continue aspirin 81 mg, Toprol-XL and rosuvastatin.  4.Tobacco abuse disorder: She is down to smoking 3 cigarettes.  Chantix made her sick.  5.  Dizziness: Blood pressure is low normal and she  is recovering from pneumonia.  I will reduce lisinopril to 5 mg daily.    Disposition: Follow up 6 months   Prentice Docker, M.D., F.A.C.C.

## 2018-02-21 ENCOUNTER — Other Ambulatory Visit: Payer: Self-pay | Admitting: Cardiovascular Disease

## 2018-03-01 DIAGNOSIS — I10 Essential (primary) hypertension: Secondary | ICD-10-CM | POA: Diagnosis not present

## 2018-03-01 DIAGNOSIS — J449 Chronic obstructive pulmonary disease, unspecified: Secondary | ICD-10-CM | POA: Diagnosis not present

## 2018-03-18 DIAGNOSIS — I5023 Acute on chronic systolic (congestive) heart failure: Secondary | ICD-10-CM | POA: Diagnosis not present

## 2018-03-18 DIAGNOSIS — I255 Ischemic cardiomyopathy: Secondary | ICD-10-CM | POA: Diagnosis not present

## 2018-03-18 DIAGNOSIS — I420 Dilated cardiomyopathy: Secondary | ICD-10-CM | POA: Diagnosis not present

## 2018-03-18 DIAGNOSIS — I160 Hypertensive urgency: Secondary | ICD-10-CM | POA: Diagnosis not present

## 2018-03-18 DIAGNOSIS — I5041 Acute combined systolic (congestive) and diastolic (congestive) heart failure: Secondary | ICD-10-CM | POA: Diagnosis not present

## 2018-03-18 DIAGNOSIS — N179 Acute kidney failure, unspecified: Secondary | ICD-10-CM | POA: Diagnosis not present

## 2018-03-18 DIAGNOSIS — I21A1 Myocardial infarction type 2: Secondary | ICD-10-CM | POA: Diagnosis not present

## 2018-03-18 DIAGNOSIS — R0902 Hypoxemia: Secondary | ICD-10-CM | POA: Diagnosis not present

## 2018-03-18 DIAGNOSIS — I509 Heart failure, unspecified: Secondary | ICD-10-CM | POA: Diagnosis not present

## 2018-03-18 DIAGNOSIS — I10 Essential (primary) hypertension: Secondary | ICD-10-CM | POA: Diagnosis not present

## 2018-03-18 DIAGNOSIS — I2510 Atherosclerotic heart disease of native coronary artery without angina pectoris: Secondary | ICD-10-CM | POA: Diagnosis not present

## 2018-03-18 DIAGNOSIS — R0602 Shortness of breath: Secondary | ICD-10-CM | POA: Diagnosis not present

## 2018-03-19 DIAGNOSIS — I255 Ischemic cardiomyopathy: Secondary | ICD-10-CM | POA: Diagnosis present

## 2018-03-19 DIAGNOSIS — Z951 Presence of aortocoronary bypass graft: Secondary | ICD-10-CM | POA: Diagnosis not present

## 2018-03-19 DIAGNOSIS — J449 Chronic obstructive pulmonary disease, unspecified: Secondary | ICD-10-CM | POA: Diagnosis present

## 2018-03-19 DIAGNOSIS — F17200 Nicotine dependence, unspecified, uncomplicated: Secondary | ICD-10-CM | POA: Diagnosis present

## 2018-03-19 DIAGNOSIS — Z9114 Patient's other noncompliance with medication regimen: Secondary | ICD-10-CM | POA: Diagnosis not present

## 2018-03-19 DIAGNOSIS — I420 Dilated cardiomyopathy: Secondary | ICD-10-CM | POA: Diagnosis not present

## 2018-03-19 DIAGNOSIS — I2510 Atherosclerotic heart disease of native coronary artery without angina pectoris: Secondary | ICD-10-CM | POA: Diagnosis present

## 2018-03-19 DIAGNOSIS — I5021 Acute systolic (congestive) heart failure: Secondary | ICD-10-CM | POA: Diagnosis not present

## 2018-03-19 DIAGNOSIS — Z79899 Other long term (current) drug therapy: Secondary | ICD-10-CM | POA: Diagnosis not present

## 2018-03-19 DIAGNOSIS — Z7982 Long term (current) use of aspirin: Secondary | ICD-10-CM | POA: Diagnosis not present

## 2018-03-19 DIAGNOSIS — N179 Acute kidney failure, unspecified: Secondary | ICD-10-CM | POA: Diagnosis present

## 2018-03-19 DIAGNOSIS — I21A1 Myocardial infarction type 2: Secondary | ICD-10-CM | POA: Diagnosis present

## 2018-03-19 DIAGNOSIS — R0602 Shortness of breath: Secondary | ICD-10-CM | POA: Diagnosis not present

## 2018-03-19 DIAGNOSIS — I071 Rheumatic tricuspid insufficiency: Secondary | ICD-10-CM | POA: Diagnosis not present

## 2018-03-19 DIAGNOSIS — I5023 Acute on chronic systolic (congestive) heart failure: Secondary | ICD-10-CM | POA: Diagnosis present

## 2018-03-19 DIAGNOSIS — I272 Pulmonary hypertension, unspecified: Secondary | ICD-10-CM | POA: Diagnosis not present

## 2018-03-19 DIAGNOSIS — R7989 Other specified abnormal findings of blood chemistry: Secondary | ICD-10-CM | POA: Diagnosis not present

## 2018-03-19 DIAGNOSIS — Z952 Presence of prosthetic heart valve: Secondary | ICD-10-CM | POA: Diagnosis not present

## 2018-03-19 DIAGNOSIS — I509 Heart failure, unspecified: Secondary | ICD-10-CM | POA: Diagnosis not present

## 2018-03-21 ENCOUNTER — Other Ambulatory Visit: Payer: Self-pay | Admitting: Cardiovascular Disease

## 2018-03-21 DIAGNOSIS — I509 Heart failure, unspecified: Secondary | ICD-10-CM | POA: Diagnosis not present

## 2018-03-28 DIAGNOSIS — I10 Essential (primary) hypertension: Secondary | ICD-10-CM | POA: Diagnosis not present

## 2018-03-28 DIAGNOSIS — Z299 Encounter for prophylactic measures, unspecified: Secondary | ICD-10-CM | POA: Diagnosis not present

## 2018-03-28 DIAGNOSIS — Z681 Body mass index (BMI) 19 or less, adult: Secondary | ICD-10-CM | POA: Diagnosis not present

## 2018-03-28 DIAGNOSIS — E7800 Pure hypercholesterolemia, unspecified: Secondary | ICD-10-CM | POA: Diagnosis not present

## 2018-03-28 DIAGNOSIS — I501 Left ventricular failure: Secondary | ICD-10-CM | POA: Diagnosis not present

## 2018-03-28 DIAGNOSIS — I5022 Chronic systolic (congestive) heart failure: Secondary | ICD-10-CM | POA: Diagnosis not present

## 2018-03-28 DIAGNOSIS — G4700 Insomnia, unspecified: Secondary | ICD-10-CM | POA: Diagnosis not present

## 2018-04-14 DIAGNOSIS — I10 Essential (primary) hypertension: Secondary | ICD-10-CM | POA: Diagnosis not present

## 2018-04-14 DIAGNOSIS — J449 Chronic obstructive pulmonary disease, unspecified: Secondary | ICD-10-CM | POA: Diagnosis not present

## 2018-05-09 DIAGNOSIS — I10 Essential (primary) hypertension: Secondary | ICD-10-CM | POA: Diagnosis not present

## 2018-05-09 DIAGNOSIS — J449 Chronic obstructive pulmonary disease, unspecified: Secondary | ICD-10-CM | POA: Diagnosis not present

## 2018-05-09 DIAGNOSIS — Z299 Encounter for prophylactic measures, unspecified: Secondary | ICD-10-CM | POA: Diagnosis not present

## 2018-05-09 DIAGNOSIS — I5022 Chronic systolic (congestive) heart failure: Secondary | ICD-10-CM | POA: Diagnosis not present

## 2018-05-09 DIAGNOSIS — Z681 Body mass index (BMI) 19 or less, adult: Secondary | ICD-10-CM | POA: Diagnosis not present

## 2018-05-09 DIAGNOSIS — I2510 Atherosclerotic heart disease of native coronary artery without angina pectoris: Secondary | ICD-10-CM | POA: Diagnosis not present

## 2018-05-25 DIAGNOSIS — I10 Essential (primary) hypertension: Secondary | ICD-10-CM | POA: Diagnosis not present

## 2018-05-25 DIAGNOSIS — J449 Chronic obstructive pulmonary disease, unspecified: Secondary | ICD-10-CM | POA: Diagnosis not present

## 2018-07-03 DIAGNOSIS — I10 Essential (primary) hypertension: Secondary | ICD-10-CM | POA: Diagnosis not present

## 2018-07-03 DIAGNOSIS — J449 Chronic obstructive pulmonary disease, unspecified: Secondary | ICD-10-CM | POA: Diagnosis not present

## 2018-08-09 DIAGNOSIS — J449 Chronic obstructive pulmonary disease, unspecified: Secondary | ICD-10-CM | POA: Diagnosis not present

## 2018-08-09 DIAGNOSIS — I10 Essential (primary) hypertension: Secondary | ICD-10-CM | POA: Diagnosis not present

## 2018-09-01 DIAGNOSIS — I10 Essential (primary) hypertension: Secondary | ICD-10-CM | POA: Diagnosis not present

## 2018-09-01 DIAGNOSIS — J449 Chronic obstructive pulmonary disease, unspecified: Secondary | ICD-10-CM | POA: Diagnosis not present

## 2018-09-14 DIAGNOSIS — G4700 Insomnia, unspecified: Secondary | ICD-10-CM | POA: Diagnosis not present

## 2018-09-14 DIAGNOSIS — I509 Heart failure, unspecified: Secondary | ICD-10-CM | POA: Diagnosis not present

## 2018-09-14 DIAGNOSIS — J449 Chronic obstructive pulmonary disease, unspecified: Secondary | ICD-10-CM | POA: Diagnosis not present

## 2018-09-14 DIAGNOSIS — I25119 Atherosclerotic heart disease of native coronary artery with unspecified angina pectoris: Secondary | ICD-10-CM | POA: Diagnosis not present

## 2018-09-14 DIAGNOSIS — E785 Hyperlipidemia, unspecified: Secondary | ICD-10-CM | POA: Diagnosis not present

## 2018-09-14 DIAGNOSIS — I252 Old myocardial infarction: Secondary | ICD-10-CM | POA: Diagnosis not present

## 2018-09-14 DIAGNOSIS — I110 Hypertensive heart disease with heart failure: Secondary | ICD-10-CM | POA: Diagnosis not present

## 2018-09-14 DIAGNOSIS — H9190 Unspecified hearing loss, unspecified ear: Secondary | ICD-10-CM | POA: Diagnosis not present

## 2018-09-14 DIAGNOSIS — F17200 Nicotine dependence, unspecified, uncomplicated: Secondary | ICD-10-CM | POA: Diagnosis not present

## 2018-09-29 DIAGNOSIS — J449 Chronic obstructive pulmonary disease, unspecified: Secondary | ICD-10-CM | POA: Diagnosis not present

## 2018-09-29 DIAGNOSIS — I10 Essential (primary) hypertension: Secondary | ICD-10-CM | POA: Diagnosis not present

## 2018-10-27 DIAGNOSIS — J449 Chronic obstructive pulmonary disease, unspecified: Secondary | ICD-10-CM | POA: Diagnosis not present

## 2018-10-27 DIAGNOSIS — I10 Essential (primary) hypertension: Secondary | ICD-10-CM | POA: Diagnosis not present

## 2018-11-15 DIAGNOSIS — I10 Essential (primary) hypertension: Secondary | ICD-10-CM | POA: Diagnosis not present

## 2018-11-15 DIAGNOSIS — F17210 Nicotine dependence, cigarettes, uncomplicated: Secondary | ICD-10-CM | POA: Diagnosis not present

## 2018-11-15 DIAGNOSIS — Z681 Body mass index (BMI) 19 or less, adult: Secondary | ICD-10-CM | POA: Diagnosis not present

## 2018-11-15 DIAGNOSIS — J069 Acute upper respiratory infection, unspecified: Secondary | ICD-10-CM | POA: Diagnosis not present

## 2018-11-15 DIAGNOSIS — J449 Chronic obstructive pulmonary disease, unspecified: Secondary | ICD-10-CM | POA: Diagnosis not present

## 2018-11-15 DIAGNOSIS — Z299 Encounter for prophylactic measures, unspecified: Secondary | ICD-10-CM | POA: Diagnosis not present

## 2018-11-17 DIAGNOSIS — Z951 Presence of aortocoronary bypass graft: Secondary | ICD-10-CM | POA: Diagnosis not present

## 2018-11-17 DIAGNOSIS — Z681 Body mass index (BMI) 19 or less, adult: Secondary | ICD-10-CM | POA: Diagnosis not present

## 2018-11-17 DIAGNOSIS — I509 Heart failure, unspecified: Secondary | ICD-10-CM | POA: Diagnosis not present

## 2018-11-17 DIAGNOSIS — I5023 Acute on chronic systolic (congestive) heart failure: Secondary | ICD-10-CM | POA: Diagnosis not present

## 2018-11-17 DIAGNOSIS — R079 Chest pain, unspecified: Secondary | ICD-10-CM | POA: Diagnosis not present

## 2018-11-17 DIAGNOSIS — I110 Hypertensive heart disease with heart failure: Secondary | ICD-10-CM | POA: Diagnosis not present

## 2018-11-17 DIAGNOSIS — R0602 Shortness of breath: Secondary | ICD-10-CM | POA: Diagnosis not present

## 2018-11-17 DIAGNOSIS — I21A1 Myocardial infarction type 2: Secondary | ICD-10-CM | POA: Diagnosis not present

## 2018-11-17 DIAGNOSIS — Z90710 Acquired absence of both cervix and uterus: Secondary | ICD-10-CM | POA: Diagnosis not present

## 2018-11-17 DIAGNOSIS — E43 Unspecified severe protein-calorie malnutrition: Secondary | ICD-10-CM | POA: Diagnosis not present

## 2018-11-17 DIAGNOSIS — I429 Cardiomyopathy, unspecified: Secondary | ICD-10-CM | POA: Diagnosis not present

## 2018-11-17 DIAGNOSIS — I340 Nonrheumatic mitral (valve) insufficiency: Secondary | ICD-10-CM | POA: Diagnosis not present

## 2018-11-19 ENCOUNTER — Inpatient Hospital Stay (HOSPITAL_COMMUNITY): Payer: Medicare PPO

## 2018-11-19 ENCOUNTER — Inpatient Hospital Stay (HOSPITAL_COMMUNITY)
Admission: AD | Admit: 2018-11-19 | Discharge: 2018-11-21 | DRG: 280 | Disposition: A | Discharge status: Home / Self Care | Payer: Medicare PPO | Source: Other Acute Inpatient Hospital | Attending: Cardiology | Admitting: Cardiology

## 2018-11-19 ENCOUNTER — Encounter (HOSPITAL_COMMUNITY): Payer: Self-pay | Admitting: Student

## 2018-11-19 ENCOUNTER — Other Ambulatory Visit: Payer: Self-pay

## 2018-11-19 DIAGNOSIS — Z23 Encounter for immunization: Secondary | ICD-10-CM | POA: Diagnosis not present

## 2018-11-19 DIAGNOSIS — E785 Hyperlipidemia, unspecified: Secondary | ICD-10-CM | POA: Diagnosis present

## 2018-11-19 DIAGNOSIS — E7800 Pure hypercholesterolemia, unspecified: Secondary | ICD-10-CM | POA: Diagnosis not present

## 2018-11-19 DIAGNOSIS — N183 Chronic kidney disease, stage 3 unspecified: Secondary | ICD-10-CM | POA: Diagnosis present

## 2018-11-19 DIAGNOSIS — I5041 Acute combined systolic (congestive) and diastolic (congestive) heart failure: Secondary | ICD-10-CM | POA: Diagnosis not present

## 2018-11-19 DIAGNOSIS — I340 Nonrheumatic mitral (valve) insufficiency: Secondary | ICD-10-CM

## 2018-11-19 DIAGNOSIS — J449 Chronic obstructive pulmonary disease, unspecified: Secondary | ICD-10-CM | POA: Diagnosis present

## 2018-11-19 DIAGNOSIS — F17210 Nicotine dependence, cigarettes, uncomplicated: Secondary | ICD-10-CM | POA: Diagnosis present

## 2018-11-19 DIAGNOSIS — R0789 Other chest pain: Secondary | ICD-10-CM | POA: Diagnosis present

## 2018-11-19 DIAGNOSIS — I25810 Atherosclerosis of coronary artery bypass graft(s) without angina pectoris: Secondary | ICD-10-CM | POA: Diagnosis not present

## 2018-11-19 DIAGNOSIS — Z951 Presence of aortocoronary bypass graft: Secondary | ICD-10-CM | POA: Diagnosis not present

## 2018-11-19 DIAGNOSIS — I21A1 Myocardial infarction type 2: Secondary | ICD-10-CM | POA: Diagnosis not present

## 2018-11-19 DIAGNOSIS — I130 Hypertensive heart and chronic kidney disease with heart failure and stage 1 through stage 4 chronic kidney disease, or unspecified chronic kidney disease: Secondary | ICD-10-CM | POA: Diagnosis not present

## 2018-11-19 DIAGNOSIS — Z20828 Contact with and (suspected) exposure to other viral communicable diseases: Secondary | ICD-10-CM | POA: Diagnosis present

## 2018-11-19 DIAGNOSIS — Z8249 Family history of ischemic heart disease and other diseases of the circulatory system: Secondary | ICD-10-CM

## 2018-11-19 DIAGNOSIS — I5023 Acute on chronic systolic (congestive) heart failure: Secondary | ICD-10-CM | POA: Diagnosis not present

## 2018-11-19 DIAGNOSIS — I5043 Acute on chronic combined systolic (congestive) and diastolic (congestive) heart failure: Secondary | ICD-10-CM | POA: Diagnosis present

## 2018-11-19 DIAGNOSIS — I255 Ischemic cardiomyopathy: Secondary | ICD-10-CM | POA: Diagnosis present

## 2018-11-19 DIAGNOSIS — I25119 Atherosclerotic heart disease of native coronary artery with unspecified angina pectoris: Secondary | ICD-10-CM | POA: Diagnosis not present

## 2018-11-19 DIAGNOSIS — I2510 Atherosclerotic heart disease of native coronary artery without angina pectoris: Secondary | ICD-10-CM | POA: Diagnosis not present

## 2018-11-19 DIAGNOSIS — Z7982 Long term (current) use of aspirin: Secondary | ICD-10-CM | POA: Diagnosis not present

## 2018-11-19 DIAGNOSIS — K219 Gastro-esophageal reflux disease without esophagitis: Secondary | ICD-10-CM | POA: Diagnosis present

## 2018-11-19 DIAGNOSIS — I361 Nonrheumatic tricuspid (valve) insufficiency: Secondary | ICD-10-CM | POA: Diagnosis not present

## 2018-11-19 DIAGNOSIS — I10 Essential (primary) hypertension: Secondary | ICD-10-CM | POA: Diagnosis present

## 2018-11-19 DIAGNOSIS — E43 Unspecified severe protein-calorie malnutrition: Secondary | ICD-10-CM | POA: Diagnosis not present

## 2018-11-19 DIAGNOSIS — I25110 Atherosclerotic heart disease of native coronary artery with unstable angina pectoris: Secondary | ICD-10-CM

## 2018-11-19 DIAGNOSIS — I214 Non-ST elevation (NSTEMI) myocardial infarction: Principal | ICD-10-CM | POA: Diagnosis present

## 2018-11-19 DIAGNOSIS — I110 Hypertensive heart disease with heart failure: Secondary | ICD-10-CM | POA: Diagnosis not present

## 2018-11-19 LAB — CBC
HCT: 43.1 % (ref 36.0–46.0)
Hemoglobin: 14.2 g/dL (ref 12.0–15.0)
MCH: 31.4 pg (ref 26.0–34.0)
MCHC: 32.9 g/dL (ref 30.0–36.0)
MCV: 95.4 fL (ref 80.0–100.0)
Platelets: 201 10*3/uL (ref 150–400)
RBC: 4.52 MIL/uL (ref 3.87–5.11)
RDW: 14.5 % (ref 11.5–15.5)
WBC: 7.5 10*3/uL (ref 4.0–10.5)
nRBC: 0 % (ref 0.0–0.2)

## 2018-11-19 LAB — PROTIME-INR
INR: 1 (ref 0.8–1.2)
Prothrombin Time: 12.8 seconds (ref 11.4–15.2)

## 2018-11-19 LAB — TROPONIN I (HIGH SENSITIVITY)
Troponin I (High Sensitivity): 1296 ng/L (ref <18)
Troponin I (High Sensitivity): 1419 ng/L (ref <18)

## 2018-11-19 LAB — BASIC METABOLIC PANEL
Anion gap: 13 (ref 5–15)
BUN: 16 mg/dL (ref 8–23)
CO2: 33 mmol/L — ABNORMAL HIGH (ref 22–32)
Calcium: 9.2 mg/dL (ref 8.9–10.3)
Chloride: 91 mmol/L — ABNORMAL LOW (ref 98–111)
Creatinine, Ser: 1.19 mg/dL — ABNORMAL HIGH (ref 0.44–1.00)
GFR calc Af Amer: 52 mL/min — ABNORMAL LOW (ref >60)
GFR calc non Af Amer: 45 mL/min — ABNORMAL LOW (ref >60)
Glucose, Bld: 89 mg/dL (ref 70–99)
Potassium: 3.3 mmol/L — ABNORMAL LOW (ref 3.5–5.1)
Sodium: 137 mmol/L (ref 135–145)

## 2018-11-19 LAB — HEPARIN LEVEL (UNFRACTIONATED): Heparin Unfractionated: 0.44 IU/mL (ref 0.30–0.70)

## 2018-11-19 LAB — ECHOCARDIOGRAM COMPLETE
Height: 60 in
Weight: 1300.8 oz

## 2018-11-19 LAB — APTT: aPTT: 33 seconds (ref 24–36)

## 2018-11-19 LAB — BRAIN NATRIURETIC PEPTIDE: B Natriuretic Peptide: 1855 pg/mL — ABNORMAL HIGH (ref 0.0–100.0)

## 2018-11-19 LAB — SARS CORONAVIRUS 2 (TAT 6-24 HRS): SARS Coronavirus 2: NEGATIVE

## 2018-11-19 MED ORDER — POTASSIUM CHLORIDE CRYS ER 20 MEQ PO TBCR
40.0000 meq | EXTENDED_RELEASE_TABLET | Freq: Once | ORAL | Status: AC
  Administered 2018-11-19: 40 meq via ORAL
  Filled 2018-11-19: qty 2

## 2018-11-19 MED ORDER — FUROSEMIDE 10 MG/ML IJ SOLN
40.0000 mg | Freq: Every day | INTRAMUSCULAR | Status: DC
  Administered 2018-11-19 – 2018-11-20 (×2): 40 mg via INTRAVENOUS
  Filled 2018-11-19 (×2): qty 4

## 2018-11-19 MED ORDER — ISOSORBIDE MONONITRATE ER 30 MG PO TB24: 30.0000 mg | ORAL_TABLET | Freq: Every day | ORAL | Status: DC

## 2018-11-19 MED ORDER — ROSUVASTATIN CALCIUM 5 MG PO TABS
5.0000 mg | ORAL_TABLET | Freq: Every day | ORAL | Status: DC
  Administered 2018-11-19: 5 mg via ORAL
  Filled 2018-11-19: qty 1

## 2018-11-19 MED ORDER — ENOXAPARIN SODIUM 30 MG/0.3ML ~~LOC~~ SOLN: 30.0000 mg | SUBCUTANEOUS | Status: DC

## 2018-11-19 MED ORDER — METOPROLOL SUCCINATE ER 25 MG PO TB24
12.5000 mg | ORAL_TABLET | Freq: Every day | ORAL | Status: DC
  Administered 2018-11-19 – 2018-11-21 (×3): 12.5 mg via ORAL
  Filled 2018-11-19 (×3): qty 1

## 2018-11-19 MED ORDER — HEPARIN BOLUS VIA INFUSION
2200.0000 [IU] | Freq: Once | INTRAVENOUS | Status: AC
  Administered 2018-11-19: 2200 [IU] via INTRAVENOUS
  Filled 2018-11-19: qty 2200

## 2018-11-19 MED ORDER — ISOSORBIDE MONONITRATE ER 30 MG PO TB24
15.0000 mg | ORAL_TABLET | Freq: Every day | ORAL | Status: DC
  Administered 2018-11-20 – 2018-11-21 (×2): 15 mg via ORAL
  Filled 2018-11-19 (×2): qty 1

## 2018-11-19 MED ORDER — NITROGLYCERIN 0.4 MG SL SUBL: 0.4000 mg | SUBLINGUAL_TABLET | SUBLINGUAL | Status: DC | PRN

## 2018-11-19 MED ORDER — INFLUENZA VAC A&B SA ADJ QUAD 0.5 ML IM PRSY
0.5000 mL | PREFILLED_SYRINGE | INTRAMUSCULAR | Status: AC
  Administered 2018-11-21: 0.5 mL via INTRAMUSCULAR
  Filled 2018-11-19: qty 0.5

## 2018-11-19 MED ORDER — PNEUMOCOCCAL VAC POLYVALENT 25 MCG/0.5ML IJ INJ: 0.5000 mL | INJECTION | INTRAMUSCULAR | Status: DC

## 2018-11-19 MED ORDER — HEPARIN (PORCINE) 25000 UT/250ML-% IV SOLN
550.0000 [IU]/h | INTRAVENOUS | Status: DC
  Administered 2018-11-19: 450 [IU]/h via INTRAVENOUS
  Filled 2018-11-19: qty 250

## 2018-11-19 MED ORDER — POTASSIUM CHLORIDE CRYS ER 20 MEQ PO TBCR
20.0000 meq | EXTENDED_RELEASE_TABLET | Freq: Once | ORAL | Status: AC
  Administered 2018-11-19: 20 meq via ORAL
  Filled 2018-11-19: qty 1

## 2018-11-19 MED ORDER — ACETAMINOPHEN 325 MG PO TABS: 650.0000 mg | ORAL_TABLET | ORAL | Status: DC | PRN

## 2018-11-19 MED ORDER — ASPIRIN EC 81 MG PO TBEC
81.0000 mg | DELAYED_RELEASE_TABLET | Freq: Every day | ORAL | Status: DC
  Administered 2018-11-19 – 2018-11-21 (×3): 81 mg via ORAL
  Filled 2018-11-19 (×3): qty 1

## 2018-11-19 MED ORDER — ONDANSETRON HCL 4 MG/2ML IJ SOLN: 4.0000 mg | Freq: Four times a day (QID) | INTRAMUSCULAR | Status: DC | PRN

## 2018-11-19 MED ORDER — ACETAMINOPHEN 325 MG PO TABS: 650.0000 mg | ORAL_TABLET | Freq: Four times a day (QID) | ORAL | Status: DC | PRN

## 2018-11-19 NOTE — Progress Notes (Addendum)
    Made aware by patient's nurse her HS Troponin was elevated to 1296 which is more concerning for NSTEMI. Discussed with Dr. Domenic Polite. Will stop Lovenox and start Heparin. Continue ASA, statin and BB therapy. Spoke with echo tech and will ask for echocardiogram to be obtained today to assess LV function and wall motion. As outlined earlier today, if EF reduced when compared to prior study would plan for repeat cath this admission.   BNP elevated to 1855. Will write for IV Lasix 40mg  daily. K+ low at 3.3 and will order replacement.   Signed, Erma Heritage, PA-C 11/19/2018, 1:24 PM Pager: [phone]   Attending note:  Discussed with Ms. Delano Metz.  In light of elevated high-sensitivity troponin I at this facility in current range we will plan to start heparin and continue to cycle.  Echocardiogram pending.  Anticipate diagnostic coronary angiography if LVEF reduced.  Satira Sark, M.D., F.A.C.C.

## 2018-11-19 NOTE — Progress Notes (Signed)
*  PRELIMINARY RESULTS* Echocardiogram 2D Echocardiogram has been performed.  Leavy Cella 11/19/2018, 3:46 PM

## 2018-11-19 NOTE — H&P (Addendum)
History & Physical    Patient ID: Susan [name] MRN: [id number], DOB/AGE: 74/01/1945   Admit date: 11/19/2018  Primary Care Provider: Kirstie PeriShah, Ashish, MD Primary Cardiologist: Prentice DockerSuresh Koneswaran, MD   Chief Complaint: Chest Pain  Patient Profile    Susan [name] is a 73 y.o. female with past medical history of CAD (s/p CABG x5 in 2000 with cath in 02/2017 showing 2/5 patent grafts with patent LIMA-LAD, patent SVG-distal RCA, occluded SVG-D1 and occluded seq-SVG-OM1-OM2 with medical management recommended and possible PCI of distal RCA branches in the future if recurrent anginal symptoms), chronic combined systolic and diastolic (EF 20-25% by echo in 12/2016, at 45-50% by echo in 05/2017), HTN, HLD, COPD and tobacco use who [facility].  History of Present Illness    Susan [name] underwent a catheterization in 02/2017 following an abnormal stress test which showed 2/5 patent grafts as outlined above. By review of rounding notes, the only PCI target was felt to be the distal RCA which would require "simultaneous kissing stents in the PDA/PLA extending back into the graft, due to the size mismatch between graft and native vessels" per Dr. Eldridge DaceVaranasi.   She did follow-up with Dr. Purvis SheffieldKoneswaran and was continued on medical therapy for her cardiomyopathy with Toprol-XL and Entresto. Developed dizziness with higher doses of Entresto and had remained on 24-26mg  BID. Follow-up echo in 05/2017 showed her EF had improved to 45-50%. For unclear reasons, she had self discontinued her Toprol-XL, Entresto and Crestor at follow-up in 06/2017. Was restarted on Toprol-XL and Lisinopril 10mg  daily. Her last visit was in 01/2018 and she denied any exertional chest pain. Was still smoking but down to 3 cigarettes daily. Had experienced hypotension and dizziness with Lisinopril, therefore this was reduced to 5mg  daily.   By [facility], she presented on 11/17/2018 for evaluation of worsening chest pain and dyspnea for the past 2 days. Initial EKG showed sinus tachycardia, HR 102 with LVH and associated repol abnormality along anterior leads and TWI along lateral leads (noted on prior tracings). ABG showed pH 7.46, pCO2 40 and pO2 52. WBC 12.5, Hgb 16.2, platelets 226, Na+ 137, K+ 3.7 and creatinine 1.42. Pro BNP 12062. Initial Troponin 0.02 with repeat Troponin T of 0.49. CXR showed pulmonary vascular congestion and cardiomegaly. Appears she was started on IV Lasix 40mg  upon admission.   She reports having chest pain the day of admission which sitting on her couch. Describes it as a discomfort along her sternal region which resembled acid reflux but was more intense and lasting longer than her prior episodes. She thinks it resembles her prior angina. Denies any associated dyspnea or palpitations. Took SL NTG with improvement in her symptoms but pain represented later in the day and resolved with SL NTG. Had recurrent pain which prompted her to go to the ED. She denies any recent orthopnea or PND. Had experienced edema but weight had been stable on her home scales.   At this time, she denies any recurrent pain while [facility]. She does have a NTG patch in place. Feels her symptoms improved with administration of IV Lasix.    Laboratory Data:  Past Medical History:  Diagnosis Date  . Anxiety   . CAD (coronary artery disease)    a. s/p CABG x5 in 2000 b. cath in 02/2017 showing 2/5 patent grafts with patent LIMA-LAD, patent SVG-distal RCA, occluded SVG-D1 and occluded  seq-SVG-OM1-OM2 with medical management recommended and possible PCI of distal RCA branches in the future if recurrent anginal symptoms  . CHF (congestive heart failure) (HCC)    a. EF 20-25% by echo in 12/2016 b. EF 45-50% by echo in 05/2017  . Chronic bronchitis (HCC)   . COPD (chronic obstructive pulmonary disease) (HCC)   . Dizziness   .  Esophageal reflux   . Essential hypertension   . Fatigue   . Hypercholesteremia   . Hyperkalemia   . Ovarian failure   . SOB (shortness of breath)     Past Surgical History:  Procedure Laterality Date  . ABDOMINAL HYSTERECTOMY    . APPENDECTOMY    . BREAST BIOPSY Right   . CARDIAC CATHETERIZATION  2000  . CORONARY ARTERY BYPASS GRAFT  2000   CABG X5  . LEFT HEART CATH AND CORS/GRAFTS ANGIOGRAPHY N/A 02/17/2017   Procedure: LEFT HEART CATH AND CORS/GRAFTS ANGIOGRAPHY;  Surgeon: Kathleene Hazel, MD;  Location: [facility] INVASIVE CV LAB;  Service: Cardiovascular;  Laterality: N/A;     Medications Prior to Admission: Prior to Admission medications   Medication Sig Start Date End Date Taking? Authorizing Provider  acetaminophen (TYLENOL) 325 MG tablet Take 2 tablets (650 mg total) by mouth every 6 (six) hours as needed for mild pain or headache. 02/18/17   Abelino Derrick, PA-C  aspirin EC 81 MG tablet Take 81 mg by mouth daily.    [provider]  furosemide (LASIX) 40 MG tablet Take 1 tablet (40 mg total) by mouth daily. 02/18/17   Abelino Derrick, PA-C  KLOR-CON M20 20 MEQ tablet TAKE 1 TABLET BY MOUTH EVERY DAY 02/22/18   Laqueta Linden, MD  lisinopril (PRINIVIL,ZESTRIL) 5 MG tablet Take 1 tablet (5 mg total) by mouth daily. 02/01/18 05/02/18  Laqueta Linden, MD  metoprolol succinate (TOPROL-XL) 25 MG 24 hr tablet Take 0.5 tablets (12.5 mg total) by mouth daily. 06/29/17   Laqueta Linden, MD  Multiple Vitamin (MULTIVITAMIN) tablet Take 1 tablet by mouth daily.    [provider]  nitroGLYCERIN (NITROSTAT) 0.4 MG SL tablet Place 1 tablet (0.4 mg total) under the tongue every 5 (five) minutes x 3 doses as needed for chest pain. 02/18/17   Abelino Derrick, PA-C  rosuvastatin (CRESTOR) 5 MG tablet Take 1 tablet by mouth once daily 03/22/18   Laqueta Linden, MD  vitamin B-12 (CYANOCOBALAMIN) 250 MCG tablet Take 250 mcg by mouth daily.    [provider]      Allergies:   No Known Allergies  Social History:   Social History   Socioeconomic History  . Marital status: Married    Spouse name: Not on file  . Number of children: Not on file  . Years of education: Not on file  . Highest education level: Not on file  Occupational History  . Not on file  Social Needs  . Financial resource strain: Not on file  . Food insecurity    Worry: Not on file    Inability: Not on file  . Transportation needs    Medical: Not on file    Non-medical: Not on file  Tobacco Use  . Smoking status: Current Every Day Smoker    Packs/day: 1.00    Years: 50.00    Pack years: 50.00    Types: Cigarettes    Last attempt to quit: 02/22/2017    Years since quitting: 1.7  . Smokeless tobacco: Never Used  Substance and Sexual Activity  . Alcohol use: No    Frequency: Never  . Drug use: No  . Sexual activity: Never  Lifestyle  . Physical activity    Days per week: Not on file    Minutes per session: Not on file  . Stress: Not on file  Relationships  . Social Musician on phone: Not on file    Gets together: Not on file    Attends religious service: Not on file    Active member of club or organization: Not on file    Attends meetings of clubs or organizations: Not on file    Relationship status: Not on file  . Intimate partner violence    Fear of current or ex partner: Not on file    Emotionally abused: Not on file    Physically abused: Not on file    Forced sexual activity: Not on file  Other Topics Concern  . Not on file  Social History Narrative  . Not on file      Family History:   The patient's family history includes Heart attack in her mother; Hypertension in her father.     Review of Systems    General:  No chills, fever, night sweats or weight changes.  Cardiovascular:  No dyspnea on exertion, orthopnea, palpitations, paroxysmal nocturnal dyspnea. Positive for chest pain and edema.  Dermatological: No rash,  lesions/masses Respiratory: No cough, dyspnea Urologic: No hematuria, dysuria Abdominal:   No nausea, vomiting, diarrhea, bright red blood per rectum, melena, or hematemesis Neurologic:  No visual changes, wkns, changes in mental status. All other systems reviewed and are otherwise negative except as noted above.  Physical Exam    Vitals:   11/19/18 1110  BP: 140/87  Pulse: 77  Resp: 20  Temp: 97.8 F (36.6 C)  TempSrc: Oral  SpO2: 95%   No intake or output data in the 24 hours ending 11/19/18 1156 There were no vitals filed for this visit. There is no height or weight on file to calculate BMI.   General: Thin elderly female appearing in no acute distress. Head: Normocephalic, atraumatic, sclera non-icteric, no xanthomas, nares are without discharge.  Neck: No carotid bruits. JVD not elevated.  Lungs: Respirations regular and unlabored, without wheezes or rales.  Heart: Regular rate and rhythm. No S3 or S4.  No murmur, no rubs, or gallops appreciated. Abdomen: Soft, non-tender, non-distended with normoactive bowel sounds. No hepatomegaly. No rebound/guarding. No obvious abdominal masses. Msk:  Strength and tone appear normal for age. No joint deformities or effusions. Extremities: No clubbing or cyanosis. No lower extremity edema.  Distal pedal pulses are 2+ bilaterally. Neuro: Alert and oriented X 3. Moves all extremities spontaneously. No focal deficits noted. Psych:  Responds to questions appropriately with a normal affect. Skin: No rashes or lesions noted  Labs and Radiology Studies    EKG: EKG from [facility] reviewed which shows sinus tachycardia, HR 102 with LVH and associated repol abnormality along anterior leads and TWI along lateral leads (noted on prior tracings).  Relevant CV Studies:  Cardiac Catheterization: 02/2017  SVG graft was visualized by angiography and is normal in caliber.  Prox RCA to Mid RCA lesion is 100% stenosed.  Mid Graft lesion is 50%  stenosed.  Post Atrio lesion is 90% stenosed.  Ost Cx to Prox Cx lesion is 99% stenosed.  Prox Cx to Mid Cx lesion is 100% stenosed.  Prox LAD to Mid LAD lesion is  100% stenosed.  LIMA graft was visualized by angiography and is normal in caliber.  Origin to Prox Graft lesion is 100% stenosed.  Origin to Prox Graft lesion before 1st Mrg is 100% stenosed.  SVG graft was visualized by angiography.  SVG graft was visualized by angiography.   1. Severe triple vessel CAD s/p 5V CABG with 2/5 patent bypass grafts 2. The LAD is occluded in the mid segment. The mid and distal LAD fills from the patent LIMA graft. The vein graft to the Diagonal is occluded. The Diagonal appears to be fill retrograde from flow into the LAD from the LIMA graft.  3. The Circumflex artery has severe proximal stenosis and then appears to be occluded after the first OM branch. The vein grafts to the first two OM branches is occluded. The AV groove Circumflex and distal OM branch is seen to fill from right to left collaterals (supplied by distal RCA branches that fill from the patent vein graft).  4. The RCA is a [name] dominant artery that is occluded in the proximal segment. The vein graft to the distal RCA is patent. There is a moderate stenosis in the mid body of the vein graft. Both the distal branches fill from the vein graft but both branches have ostial/proximal severe stenosis. These branches supply the AV groove Circumflex. 5. Ischemic cardiomyopathy  Recommendations: She has severe CAD. I think the disease in the distal RCA branches are the only targets for PCI. I will review with the IC team. Will hydrate post cath and if renal function is stable, consider PCI of the distal RCA branches through the vein graft tomorrow.    Echocardiogram: 05/2017 Study Conclusions  - Left ventricle: The cavity size was normal. Wall thickness was   normal. Systolic function was mildly reduced. The estimated   ejection  fraction was in the range of 45% to 50%. Doppler   parameters are consistent with abnormal left ventricular   relaxation (grade 1 diastolic dysfunction). Doppler parameters   are consistent with high ventricular filling pressure. - Aortic valve: Valve area (VTI): 1.95 cm^2. Valve area (Vmax):   2.01 cm^2. Valve area (Vmean): 1.84 cm^2. - Atrial septum: There was increased thickness of the septum,   consistent with lipomatous hypertrophy. No defect or patent   foramen ovale was identified. - Tricuspid valve: There was moderate regurgitation. - Pulmonary arteries: PA peak pressure: 32 mm Hg (S). PASP is   borderline elevated.   Laboratory Data:  ChemistryNo results for input(s): NA, K, CL, CO2, GLUCOSE, BUN, CREATININE, CALCIUM, GFRNONAA, GFRAA, ANIONGAP in the last 168 hours.  No results for input(s): PROT, ALBUMIN, AST, ALT, ALKPHOS, BILITOT in the last 168 hours. HematologyNo results for input(s): WBC, RBC, HGB, HCT, MCV, MCH, MCHC, RDW, PLT in the last 168 hours. Cardiac EnzymesNo results for input(s): TROPONINI in the last 168 hours. No results for input(s): TROPIPOC in the last 168 hours.  BNPNo results for input(s): BNP, PROBNP in the last 168 hours.  DDimer No results for input(s): DDIMER in the last 168 hours.  Radiology/Studies:  No results found.  Assessment and Plan:   1. Acute CHF Exacerbation (echo pending to assess systolic vs. diastolic dysfunction) - EF previously 20-25% by echo in 12/2016, at 45-50% by echo in 05/2017. Pro BNP 12062. Initial Troponin 0.02 with repeat Troponin T of 0.49. Was diuresed while at [facility] and appears euvolemic by examination. Will recheck BNP and BMET. Consider resuming PO Lasix tomorrow pending results.  -  will repeat echocardiogram to assess LV function and wall motion. Continue Toprol-XL. If EF reduced again, would recommend restarting Entresto as EF previously improved in 2019 with medical therapy (on Lisinopril PTA but it does not  appear this was administered at [facility] but would allow 36 hour washout from this admission to confirm).   2. CAD/Elevated Troponin - s/p CABG x5 in 2000 with cath in 02/2017 showing 2/5 patent grafts with patent LIMA-LAD, patent SVG-distal RCA, occluded SVG-D1 and occluded seq-SVG-OM1-OM2 with medical management recommended and possible PCI of distal RCA branches in the future if recurrent anginal symptoms. - initial EKG showed sinus tachycardia, HR 102 with LVH and associated repol abnormality along anterior leads and TWI along lateral leads (noted on prior tracings). Initial Troponin 0.02 with repeat Troponin T of 0.49. - not currently on Heparin. Will check HS Troponin values. If trending upwards, stop DVT prophylaxis Lovenox and initiate ACS Heparin dosing. If trend remains flat, it is possible her elevation was secondary to demand ischemia in the setting of CHF exacerbation. - will obtain a repeat echo. If EF remains preserved, would try medical therapy given cath results from last year. Will start Imdur 30mg  daily. Continue ASA, BB, and statin therapy. If EF reduced or enzymes trending upwards, would consider repeat coronary angiography.   3. HTN - BP at 140/87 upon arrival to [facility]. Will restart PTA Toprol-XL 12.5mg  daily and add Imdur 30mg  daily.    4. HLD - continue PTA Crestor 5mg  daily. Goal LDL less than 70 with known CAD.  5. Stage 3 CKD - baseline 1.3 - 1.4. at 1.42 upon [facility]. Will recheck BMET today.    For questions or updates, please contact [facility] Please consult [email or web address] for contact info under Cardiology/STEMI.   Signed, Ellsworth LennoxBrittany M Strader, PA-C 11/19/2018, 11:56 AM Pager: [phone]   Attending note:  Patient seen and examined.  I reviewed her records and discussed the case with Ms. Patrick JupiterStrader PA-C, I agree with her above findings and recommendations.  Susan [name] is a patient of Dr. Purvis SheffieldKoneswaran transferred [facility]  with recent onset chest discomfort.  She has a history of multivessel CAD status post CABG as well as prior cardiomyopathy with LVEF as low as 20 to 25% in 2018, although improved to the range of 45 to 50% by follow-up echocardiogram in May of last year.  Medical therapy has been simplified over time she does not report any regular angina symptoms, although does have graft disease by angiography in February of last year that was felt to be best managed medically.  She states that she developed chest discomfort, thought that it might have been a reflux but similar to prior angina this past Friday, improved with nitroglycerin and prompted her to seek medical attention.  Under observation she was found to have minimal elevation in troponin T to 0.49 although had no recurring chest pain.  Chest x-ray reported pulmonary vascular congestion and cardiomegaly which she was treated with IV Lasix, states that she feels better without intervention.  On examination today she appears comfortable.  She is afebrile, heart rate in the 70s in sinus rhythm, blood pressure 140/87.  Lungs exhibit decreased but clear breath sounds.  Cardiac exam reveals RRR without gallop.  She has no peripheral edema.  Lab work from [facility] showed potassium 3.7, creatinine 1.42, pro-BNP 12062, peak troponin T 0.49.  Recent chest pain at rest and possible acute on chronic combined heart failure,  currently symptomatically improved with IV diuresis and on nitroglycerin patch.  Plan is to recheck lab work including BNP, BMET, and high-sensitivity troponin I levels.  Follow-up echocardiogram will also be obtained to reevaluate LVEF.  She had been on lisinopril as an outpatient, but not resumed so far.  We will start Toprol-XL, change from nitroglycerin patch to Imdur, start Crestor, and start Imdur.  Depending on LVEF may consider resumption of Entresto which she had been on prior to lisinopril.  If her LVEF has worsened, can  consider a follow-up cardiac catheterization, although revascularization options may be limited.  On the other hand, if she remains clinically stable and LVEF is also stable, would have her ambulate to reevaluate symptoms and plan medical therapy most likely.  May need an outpatient diuretic as well.  Jonelle Sidle, M.D., F.A.C.C.

## 2018-11-19 NOTE — Progress Notes (Signed)
Lab called, patient troponin level 1,296, pt. Denies chest pain. Cardiology NP notified. Will continue to monitor patient.

## 2018-11-19 NOTE — Progress Notes (Signed)
[place] for heparin Indication: chest pain/ACS  No Known Allergies  Patient Measurements: Height: 5' (152.4 cm) Weight: 81 lb 4.8 oz (36.9 kg) IBW/kg (Calculated) : 45.5 Heparin Dosing Weight: 36.9kg   Vital Signs: Temp: 98.4 F (36.9 C) (11/08 2022) Temp Source: Oral (11/08 2022) BP: 147/89 (11/08 2022) Pulse Rate: 76 (11/08 2022)  Labs: Recent Labs    11/19/18 1151 11/19/18 1403 11/19/18 2316  HGB  --  14.2  --   HCT  --  43.1  --   PLT  --  201  --   APTT  --  33  --   LABPROT  --  12.8  --   INR  --  1.0  --   HEPARINUNFRC  --   --  0.44  CREATININE 1.19*  --   --   TROPONINIHS 1,296* 1,419*  --     Estimated Creatinine Clearance: 24.2 mL/min (A) (by C-G formula based on SCr of 1.19 mg/dL (H)).   Assessment: 73 y.o. female with NSTEMI for heparin  Goal of Therapy:  Heparin level 0.3-0.7 units/ml Monitor platelets by anticoagulation protocol: Yes   Plan:  Continue Heparin at current rate  Phillis Knack, PharmD, BCPS  11/19/2018,11:42 PM

## 2018-11-19 NOTE — Progress Notes (Addendum)
ANTICOAGULATION CONSULT NOTE - Initial Consult  Pharmacy Consult for heparin Indication: chest pain/ACS  No Known Allergies  Patient Measurements:   Heparin Dosing Weight: 36.9kg   Vital Signs: Temp: 97.8 F (36.6 C) (11/08 1110) Temp Source: Oral (11/08 1110) BP: 140/87 (11/08 1110) Pulse Rate: 77 (11/08 1110)  Labs: Recent Labs    11/19/18 1151  CREATININE 1.19*  TROPONINIHS 1,296*    CrCl cannot be calculated (Unknown ideal weight.).   Medical History: Past Medical History:  Diagnosis Date  . Anxiety   . CAD (coronary artery disease)    a. s/p CABG x5 in 2000 b. cath in 02/2017 showing 2/5 patent grafts with patent LIMA-LAD, patent SVG-distal RCA, occluded SVG-D1 and occluded seq-SVG-OM1-OM2 with medical management recommended and possible PCI of distal RCA branches in the future if recurrent anginal symptoms  . CHF (congestive heart failure) ([place])    a. EF 20-25% by echo in 12/2016 b. EF 45-50% by echo in 05/2017  . Chronic bronchitis ([place])   . COPD (chronic obstructive pulmonary disease) ([place])   . Dizziness   . Esophageal reflux   . Essential hypertension   . Fatigue   . Hypercholesteremia   . Hyperkalemia   . Ovarian failure   . SOB (shortness of breath)     Medications:  Scheduled:  . aspirin EC  81 mg Oral Daily  . furosemide  40 mg Intravenous Daily  . [START ON 11/20/2018] isosorbide mononitrate  15 mg Oral Daily  . metoprolol succinate  12.5 mg Oral Daily  . potassium chloride  40 mEq Oral Once  . rosuvastatin  5 mg Oral q1800   Infusions:    Assessment: 73 yo female transferred from [facility] for concern of NSTEMI. Pharmacy consulted to dose heparin for ACS. Spoke with cardiology and nursing and appears patient was not receiving anticoagulation other than DVT prophylaxis when transferred. Troponin 1296. BNP 1855. At [facility] Hgb was 16.2 and plt 226.    Goal of Therapy:  Heparin level 0.3-0.7 units/ml Monitor platelets by anticoagulation  protocol: Yes   Plan:  Obtain baseline CBC Heparin 2200 units x1 bolus  Heparin 450 units/hr  Check heparin level at 2230  Monitor daily heparin level, CBC, and S/S of bleeding   Cristela Felt, PharmD PGY1 Pharmacy Resident [employer]: [phone]  11/19/2018,1:19 PM

## 2018-11-20 ENCOUNTER — Encounter (HOSPITAL_COMMUNITY)
Admission: AD | Disposition: A | Discharge status: Home / Self Care | Payer: Self-pay | Source: Other Acute Inpatient Hospital | Attending: Cardiology

## 2018-11-20 DIAGNOSIS — E7800 Pure hypercholesterolemia, unspecified: Secondary | ICD-10-CM

## 2018-11-20 DIAGNOSIS — I2510 Atherosclerotic heart disease of native coronary artery without angina pectoris: Secondary | ICD-10-CM

## 2018-11-20 DIAGNOSIS — I214 Non-ST elevation (NSTEMI) myocardial infarction: Principal | ICD-10-CM

## 2018-11-20 DIAGNOSIS — I5041 Acute combined systolic (congestive) and diastolic (congestive) heart failure: Secondary | ICD-10-CM

## 2018-11-20 DIAGNOSIS — I25810 Atherosclerosis of coronary artery bypass graft(s) without angina pectoris: Secondary | ICD-10-CM

## 2018-11-20 DIAGNOSIS — Z951 Presence of aortocoronary bypass graft: Secondary | ICD-10-CM

## 2018-11-20 DIAGNOSIS — I25119 Atherosclerotic heart disease of native coronary artery with unspecified angina pectoris: Secondary | ICD-10-CM

## 2018-11-20 HISTORY — PX: LEFT HEART CATH AND CORS/GRAFTS ANGIOGRAPHY: CATH118250

## 2018-11-20 LAB — BASIC METABOLIC PANEL
Anion gap: 10 (ref 5–15)
BUN: 14 mg/dL (ref 8–23)
CO2: 30 mmol/L (ref 22–32)
Calcium: 8.8 mg/dL — ABNORMAL LOW (ref 8.9–10.3)
Chloride: 96 mmol/L — ABNORMAL LOW (ref 98–111)
Creatinine, Ser: 1.15 mg/dL — ABNORMAL HIGH (ref 0.44–1.00)
GFR calc Af Amer: 54 mL/min — ABNORMAL LOW (ref >60)
GFR calc non Af Amer: 47 mL/min — ABNORMAL LOW (ref >60)
Glucose, Bld: 105 mg/dL — ABNORMAL HIGH (ref 70–99)
Potassium: 3.7 mmol/L (ref 3.5–5.1)
Sodium: 136 mmol/L (ref 135–145)

## 2018-11-20 LAB — CBC
HCT: 44 % (ref 36.0–46.0)
Hemoglobin: 14.5 g/dL (ref 12.0–15.0)
MCH: 31.4 pg (ref 26.0–34.0)
MCHC: 33 g/dL (ref 30.0–36.0)
MCV: 95.2 fL (ref 80.0–100.0)
Platelets: 204 10*3/uL (ref 150–400)
RBC: 4.62 MIL/uL (ref 3.87–5.11)
RDW: 14.4 % (ref 11.5–15.5)
WBC: 6.9 10*3/uL (ref 4.0–10.5)
nRBC: 0 % (ref 0.0–0.2)

## 2018-11-20 LAB — LIPID PANEL
Cholesterol: 198 mg/dL (ref 0–200)
HDL: 77 mg/dL (ref >40)
LDL Cholesterol: 98 mg/dL (ref 0–99)
Total CHOL/HDL Ratio: 2.6 RATIO
Triglycerides: 114 mg/dL (ref <150)
VLDL: 23 mg/dL (ref 0–40)

## 2018-11-20 LAB — HEPARIN LEVEL (UNFRACTIONATED): Heparin Unfractionated: 0.24 IU/mL — ABNORMAL LOW (ref 0.30–0.70)

## 2018-11-20 SURGERY — LEFT HEART CATH AND CORS/GRAFTS ANGIOGRAPHY
Anesthesia: LOCAL

## 2018-11-20 MED ORDER — MIDAZOLAM HCL 2 MG/2ML IJ SOLN
INTRAMUSCULAR | Status: AC
  Filled 2018-11-20: qty 2

## 2018-11-20 MED ORDER — HYDRALAZINE HCL 20 MG/ML IJ SOLN
INTRAMUSCULAR | Status: AC
  Filled 2018-11-20: qty 1

## 2018-11-20 MED ORDER — SODIUM CHLORIDE 0.9% FLUSH
3.0000 mL | Freq: Two times a day (BID) | INTRAVENOUS | Status: DC
  Administered 2018-11-21: 3 mL via INTRAVENOUS

## 2018-11-20 MED ORDER — SODIUM CHLORIDE 0.9 % IV SOLN: 250.0000 mL | INTRAVENOUS | Status: DC | PRN

## 2018-11-20 MED ORDER — CLOPIDOGREL BISULFATE 75 MG PO TABS
300.0000 mg | ORAL_TABLET | Freq: Once | ORAL | Status: AC
  Administered 2018-11-20: 300 mg via ORAL
  Filled 2018-11-20: qty 4

## 2018-11-20 MED ORDER — FENTANYL CITRATE (PF) 100 MCG/2ML IJ SOLN
INTRAMUSCULAR | Status: AC
  Filled 2018-11-20: qty 2

## 2018-11-20 MED ORDER — SODIUM CHLORIDE 0.9 % IV SOLN
INTRAVENOUS | Status: AC | PRN
  Administered 2018-11-20: 10 mL/h via INTRAVENOUS

## 2018-11-20 MED ORDER — SODIUM CHLORIDE 0.9 % IV SOLN: INTRAVENOUS | Status: AC

## 2018-11-20 MED ORDER — MIDAZOLAM HCL 2 MG/2ML IJ SOLN
INTRAMUSCULAR | Status: DC | PRN
  Administered 2018-11-20: 0.5 mg via INTRAVENOUS

## 2018-11-20 MED ORDER — IOHEXOL 350 MG/ML SOLN
INTRAVENOUS | Status: DC | PRN
  Administered 2018-11-20: 16:00:00 50 mL

## 2018-11-20 MED ORDER — FENTANYL CITRATE (PF) 100 MCG/2ML IJ SOLN
INTRAMUSCULAR | Status: DC | PRN
  Administered 2018-11-20: 12.5 ug via INTRAVENOUS

## 2018-11-20 MED ORDER — HEPARIN SODIUM (PORCINE) 5000 UNIT/ML IJ SOLN
5000.0000 [IU] | Freq: Three times a day (TID) | INTRAMUSCULAR | Status: DC
  Administered 2018-11-21 (×2): 5000 [IU] via SUBCUTANEOUS
  Filled 2018-11-20 (×2): qty 1

## 2018-11-20 MED ORDER — LABETALOL HCL 5 MG/ML IV SOLN
INTRAVENOUS | Status: AC
  Filled 2018-11-20: qty 4

## 2018-11-20 MED ORDER — LABETALOL HCL 5 MG/ML IV SOLN
10.0000 mg | INTRAVENOUS | Status: AC | PRN
  Administered 2018-11-20 (×3): 10 mg via INTRAVENOUS
  Filled 2018-11-20: qty 4

## 2018-11-20 MED ORDER — SODIUM CHLORIDE 0.9 % IV SOLN: INTRAVENOUS | Status: DC

## 2018-11-20 MED ORDER — ASPIRIN 81 MG PO CHEW: 81.0000 mg | CHEWABLE_TABLET | ORAL | Status: DC

## 2018-11-20 MED ORDER — SODIUM CHLORIDE 0.9% FLUSH: 3.0000 mL | INTRAVENOUS | Status: DC | PRN

## 2018-11-20 MED ORDER — CLOPIDOGREL BISULFATE 75 MG PO TABS
75.0000 mg | ORAL_TABLET | Freq: Every day | ORAL | Status: DC
  Administered 2018-11-21: 75 mg via ORAL
  Filled 2018-11-20: qty 1

## 2018-11-20 MED ORDER — HYDRALAZINE HCL 10 MG PO TABS
10.0000 mg | ORAL_TABLET | Freq: Three times a day (TID) | ORAL | Status: DC
  Administered 2018-11-20: 10 mg via ORAL
  Filled 2018-11-20 (×2): qty 1

## 2018-11-20 MED ORDER — HEPARIN (PORCINE) IN NACL 1000-0.9 UT/500ML-% IV SOLN
INTRAVENOUS | Status: DC | PRN
  Administered 2018-11-20 (×2): 500 mL

## 2018-11-20 MED ORDER — LIDOCAINE HCL (PF) 1 % IJ SOLN
INTRAMUSCULAR | Status: DC | PRN
  Administered 2018-11-20: 10 mL

## 2018-11-20 MED ORDER — HYDRALAZINE HCL 25 MG PO TABS
25.0000 mg | ORAL_TABLET | Freq: Three times a day (TID) | ORAL | Status: DC
  Administered 2018-11-20 – 2018-11-21 (×3): 25 mg via ORAL
  Filled 2018-11-20 (×3): qty 1

## 2018-11-20 MED ORDER — HEPARIN (PORCINE) IN NACL 1000-0.9 UT/500ML-% IV SOLN
INTRAVENOUS | Status: AC
  Filled 2018-11-20: qty 1000

## 2018-11-20 MED ORDER — HYDRALAZINE HCL 20 MG/ML IJ SOLN
INTRAMUSCULAR | Status: DC | PRN
  Administered 2018-11-20: 5 mg via INTRAVENOUS

## 2018-11-20 MED ORDER — HYDRALAZINE HCL 20 MG/ML IJ SOLN
10.0000 mg | INTRAMUSCULAR | Status: AC | PRN
  Administered 2018-11-20: 10 mg via INTRAVENOUS

## 2018-11-20 MED ORDER — ROSUVASTATIN CALCIUM 5 MG PO TABS
10.0000 mg | ORAL_TABLET | Freq: Every day | ORAL | Status: DC
  Administered 2018-11-20: 10 mg via ORAL
  Filled 2018-11-20 (×2): qty 2

## 2018-11-20 MED ORDER — LIDOCAINE HCL (PF) 1 % IJ SOLN
INTRAMUSCULAR | Status: AC
  Filled 2018-11-20: qty 30

## 2018-11-20 SURGICAL SUPPLY — 13 items
CATH INFINITI 5 FR IM (CATHETERS) ×2 IMPLANT
CATH INFINITI 5 FR MPA2 (CATHETERS) ×2 IMPLANT
CATH INFINITI 5FR JL4 (CATHETERS) ×2 IMPLANT
CATH INFINITI JR4 5F (CATHETERS) ×2 IMPLANT
GUIDEWIRE ANGLED .035X150CM (WIRE) ×2 IMPLANT
KIT HEART LEFT (KITS) ×2 IMPLANT
KIT MICROPUNCTURE NIT STIFF (SHEATH) ×2 IMPLANT
PACK CARDIAC CATHETERIZATION (CUSTOM PROCEDURE TRAY) ×2 IMPLANT
SHEATH PINNACLE 5F 10CM (SHEATH) ×2 IMPLANT
SHEATH PROBE COVER 6X72 (BAG) ×2 IMPLANT
TRANSDUCER W/STOPCOCK (MISCELLANEOUS) ×2 IMPLANT
TUBING CIL FLEX 10 FLL-RA (TUBING) IMPLANT
WIRE EMERALD 3MM-J .035X150CM (WIRE) ×2 IMPLANT

## 2018-11-20 NOTE — Interval H&P Note (Signed)
History and Physical Interval Note:  11/20/2018 2:35 PM  Susan [name]  has presented today for surgery, with the diagnosis of NSTEMI.  The various methods of treatment have been discussed with the patient and family. After consideration of risks, benefits and other options for treatment, the patient has consented to  Procedure(s): LEFT HEART CATH AND CORONARY ANGIOGRAPHY (N/A) as a surgical intervention.  The patient's history has been reviewed, patient examined, no change in status, stable for surgery.  I have reviewed the patient's chart and labs.  Questions were answered to the patient's satisfaction.    Cath Lab Visit (complete for each Cath Lab visit)  Clinical Evaluation Leading to the Procedure:   ACS: Yes.    Non-ACS:   N/A  Neiko Trivedi

## 2018-11-20 NOTE — Progress Notes (Signed)
Called by nurse that tele was reading STEMI, pt was pain free, EKG was ordered and no acute changes, + LVH.  Pt is for cardiac cath today.

## 2018-11-20 NOTE — Progress Notes (Signed)
[place] for heparin Indication: chest pain/ACS  No Known Allergies  Patient Measurements: Height: 5' (152.4 cm) Weight: 78 lb 8 oz (35.6 kg) IBW/kg (Calculated) : 45.5 Heparin Dosing Weight: 36.9kg   Vital Signs: Temp: 98.3 F (36.8 C) (11/09 0450) Temp Source: Oral (11/09 0450) BP: 180/88 (11/09 0450) Pulse Rate: 68 (11/09 0450)  Labs: Recent Labs    11/19/18 1151 11/19/18 1403 11/19/18 2316 11/20/18 0516  HGB  --  14.2  --  14.5  HCT  --  43.1  --  44.0  PLT  --  201  --  204  APTT  --  33  --   --   LABPROT  --  12.8  --   --   INR  --  1.0  --   --   HEPARINUNFRC  --   --  0.44 0.24*  CREATININE 1.19*  --   --  1.15*  TROPONINIHS 1,296* 1,419*  --   --     Estimated Creatinine Clearance: 24.1 mL/min (A) (by C-G formula based on SCr of 1.15 mg/dL (H)).   Medical History: Past Medical History:  Diagnosis Date  . Anxiety   . CAD (coronary artery disease)    a. s/p CABG x5 in 2000 b. cath in 02/2017 showing 2/5 patent grafts with patent LIMA-LAD, patent SVG-distal RCA, occluded SVG-D1 and occluded seq-SVG-OM1-OM2 with medical management recommended and possible PCI of distal RCA branches in the future if recurrent anginal symptoms  . CHF (congestive heart failure) ([place])    a. EF 20-25% by echo in 12/2016 b. EF 45-50% by echo in 05/2017  . Chronic bronchitis ([place])   . COPD (chronic obstructive pulmonary disease) ([place])   . Dizziness   . Esophageal reflux   . Essential hypertension   . Fatigue   . Hypercholesteremia   . Hyperkalemia   . Ovarian failure   . SOB (shortness of breath)     Medications:  Scheduled:  . aspirin EC  81 mg Oral Daily  . furosemide  40 mg Intravenous Daily  . influenza vaccine adjuvanted  0.5 mL Intramuscular Tomorrow-1000  . isosorbide mononitrate  15 mg Oral Daily  . metoprolol succinate  12.5 mg Oral Daily  . pneumococcal 23 valent vaccine  0.5 mL Intramuscular Tomorrow-1000  .  rosuvastatin  5 mg Oral q1800   Infusions:  . heparin 450 Units/hr (11/19/18 2000)    Assessment: 73 yo female transferred from [facility] for concern of NSTEMI. Pharmacy consulted to dose heparin for ACS. Spoke with cardiology and nursing and appears patient was not receiving anticoagulation other than DVT prophylaxis when transferred. Troponin 1296. BNP 1855. At [facility] Hgb was 16.2 and plt 226.   Heparin level subtherapeutic at 0.24, CBC wnl  Goal of Therapy:  Heparin level 0.3-0.7 units/ml Monitor platelets by anticoagulation protocol: Yes   Plan:  Increase heparin gtt to 550 units/hr F/u 8 hour heparin level  Bertis Ruddy, PharmD Clinical Pharmacist Please check AMION for all [place] numbers 11/20/2018 8:37 AM

## 2018-11-20 NOTE — Progress Notes (Signed)
Nitro patch removed from left chest at 1005 prior to Imdur admin per nursing orders.  Patged NP [place] at 1244, informed notified by tele at 1231 ST elevation in MCL lead highest of 2.9. RN to bedside, no pain, asymptomatic. BP 141/93. Upon callback informed to complete EKG.  Paged NP Dorene Ar at [address] [id number]. EKG complete.  Patient left floor for cath lab approx 1430  Patient returned to floor at 1739. RN to bedside patient A&Ox4. No pain. Site at right groin/femoral assessed; gauze with transparent dressing and no drainage. Patient educated to hold site when cough, sneeze. Educated no bending and HOB no greater than 30 degrees. +1 dorsal and +2 posterior tibial pulses.

## 2018-11-20 NOTE — H&P (View-Only) (Signed)
Progress Note  Patient Name: Susan [name] Date of Encounter: 11/20/2018  Primary Cardiologist: Prentice Docker, MD   Subjective   No further pain and no SOB, able to lie almost flat without SOB  Inpatient Medications    Scheduled Meds: . aspirin EC  81 mg Oral Daily  . furosemide  40 mg Intravenous Daily  . influenza vaccine adjuvanted  0.5 mL Intramuscular Tomorrow-1000  . isosorbide mononitrate  15 mg Oral Daily  . metoprolol succinate  12.5 mg Oral Daily  . pneumococcal 23 valent vaccine  0.5 mL Intramuscular Tomorrow-1000  . rosuvastatin  5 mg Oral q1800   Continuous Infusions: . heparin 450 Units/hr (11/19/18 2000)   PRN Meds: acetaminophen, nitroGLYCERIN, ondansetron (ZOFRAN) IV   Vital Signs    Vitals:   11/19/18 1610 11/19/18 2022 11/20/18 0055 11/20/18 0450  BP: (!) 163/91 (!) 147/89 (!) 180/90 (!) 180/88  Pulse: 72 76 75 68  Resp: 16 16 16 18   Temp: 98.3 F (36.8 C) 98.4 F (36.9 C) 98.3 F (36.8 C) 98.3 F (36.8 C)  TempSrc: Oral Oral Oral Oral  SpO2: 97% 95%  92%  Weight:   35.6 kg   Height:        Intake/Output Summary (Last 24 hours) at 11/20/2018 0753 Last data filed at 11/20/2018 0452 Gross per 24 hour  Intake 1204.01 ml  Output 1800 ml  Net -595.99 ml   Last 3 Weights 11/20/2018 11/19/2018 02/01/2018  Weight (lbs) 78 lb 8 oz 81 lb 4.8 oz 82 lb  Weight (kg) 35.607 kg 36.877 kg 37.195 kg      Telemetry    SR with PACs - Personally Reviewed  ECG    No new - Personally Reviewed  Physical Exam   GEN: No acute distress.   Neck: + JVD Cardiac: RRR, no murmurs, rubs, or gallops.  Respiratory: Clear to diminished to auscultation bilaterally. GI: Soft, nontender, non-distended  MS: No edema; No deformity. Neuro:  Nonfocal  Psych: Normal affect   Labs    High Sensitivity Troponin:   Recent Labs  Lab 11/19/18 1151 11/19/18 1403  TROPONINIHS 1,296* 1,419*      Chemistry Recent Labs  Lab 11/19/18 1151 11/20/18 0516  NA  137 136  K 3.3* 3.7  CL 91* 96*  CO2 33* 30  GLUCOSE 89 105*  BUN 16 14  CREATININE 1.19* 1.15*  CALCIUM 9.2 8.8*  GFRNONAA 45* 47*  GFRAA 52* 54*  ANIONGAP 13 10     Hematology Recent Labs  Lab 11/19/18 1403 11/20/18 0516  WBC 7.5 6.9  RBC 4.52 4.62  HGB 14.2 14.5  HCT 43.1 44.0  MCV 95.4 95.2  MCH 31.4 31.4  MCHC 32.9 33.0  RDW 14.5 14.4  PLT 201 204    BNP Recent Labs  Lab 11/19/18 1151  BNP 1,855.0*     DDimer No results for input(s): DDIMER in the last 168 hours.   Radiology    No results found.  Cardiac Studies   IMPRESSIONS    1. Left ventricular ejection fraction, by visual estimation, is 30%. The left ventricle has severely decreased function. There is mildly increased left ventricular hypertrophy.  2. Multiple segmental abnormalities exist. See findings.  3. Left ventricular diastolic parameters are consistent with Grade I diastolic dysfunction (impaired relaxation).  4. Global right ventricle has moderately reduced systolic function.The right ventricular size is normal. No increase in right ventricular wall thickness.  5. Left atrial size was mildly dilated.  6. Right  atrial size was normal.  7. Mild aortic valve annular calcification.  8. The mitral valve is grossly normal. Mild mitral valve regurgitation.  9. The tricuspid valve is grossly normal. Tricuspid valve regurgitation is mild. 10. The aortic valve is tricuspid. Aortic valve regurgitation is not visualized. Mild aortic valve sclerosis without stenosis. 11. The pulmonic valve was grossly normal. Pulmonic valve regurgitation is not visualized. 12. Moderately elevated pulmonary artery systolic pressure. 13. The tricuspid regurgitant velocity is 3.09 m/s, and with an assumed right atrial pressure of 3 mmHg, the estimated right ventricular systolic pressure is moderately elevated at 41.2 mmHg. 14. The inferior vena cava is normal in size with greater than 50% respiratory variability,  suggesting right atrial pressure of 3 mmHg.  FINDINGS  Left Ventricle: Left ventricular ejection fraction, by visual estimation, is 30%. The left ventricle has severely decreased function. The left ventricular internal cavity size was the left ventricle is normal in size. There is mildly increased left  ventricular hypertrophy. Left ventricular diastolic parameters are consistent with Grade I diastolic dysfunction (impaired relaxation).    LV Wall Scoring: The basal and mid anterolateral wall, basal and mid inferior wall, and basal and mid inferolateral wall are akinetic. The LAD distribution, entire septum, and entire apex are hypokinetic.  Right Ventricle: The right ventricular size is normal. No increase in right ventricular wall thickness. Global RV systolic function is has moderately reduced systolic function. The tricuspid regurgitant velocity is 3.09 m/s, and with an assumed right  atrial pressure of 3 mmHg, the estimated right ventricular systolic pressure is moderately elevated at 41.2 mmHg.  Left Atrium: Left atrial size was mildly dilated.  Right Atrium: Right atrial size was normal in size  Pericardium: There is no evidence of pericardial effusion.  Mitral Valve: The mitral valve is grossly normal. There is mild thickening of the mitral valve leaflet(s). Mild mitral valve regurgitation.  Tricuspid Valve: The tricuspid valve is grossly normal. Tricuspid valve regurgitation is mild.  Aortic Valve: The aortic valve is tricuspid. Aortic valve regurgitation is not visualized. Mild aortic valve sclerosis is present, with no evidence of aortic valve stenosis. Mild aortic valve annular calcification.  Pulmonic Valve: The pulmonic valve was grossly normal. Pulmonic valve regurgitation is not visualized.  Aorta: The aortic root is normal in size and structure.  Venous: The inferior vena cava is normal in size with greater than 50% respiratory variability, suggesting right  atrial pressure of 3 mmHg.  IAS/Shunts: No atrial level shunt detected by color flow Doppler.       Patient Profile     73 y.o. female with past medical history of CAD (s/p CABG x5 in 2000 with cath in 02/2017 showing 2/5 patent grafts with patent LIMA-LAD, patent SVG-distal RCA, occluded SVG-D1 and occluded seq-SVG-OM1-OM2 with medical management recommended and possible PCI of distal RCA branches in the future if recurrent anginal symptoms), chronic combined systolic and diastolic (EF 20-25% by echo in 12/2016, at 45-50% by echo in 05/2017), HTN, HLD, COPD and tobacco use now transferred from [facility] with NSTEMI.    Assessment & Plan    1. Acute CHF Exacerbation with new decrease in EF to 30% - EF previously 20-25% by echo in 12/2016, at 45-50% by echo in 05/2017 and now 30%.  Pro BNP 12062. Initial Troponin 0.02 with repeat Troponin T of 0.49. Was diuresed while at [facility] and appears euvolemic by examination.  Consider resuming PO Lasix tomorrow pending results.  - echocardiogram with decrease in EF  to 30%. Continue Toprol-XL. If EF reduced again, would recommend restarting Entresto as EF previously improved in 2019 with medical therapy (on Lisinopril PTA but it does not appear this was administered at [facility] but would allow 36 hour washout from this admission to confirm).    2. CAD/Elevated Troponin - s/p CABG x5 in 2000 with cath in 02/2017 showing 2/5 patent grafts with patent LIMA-LAD, patent SVG-distal RCA, occluded SVG-D1 and occluded seq-SVG-OM1-OM2 with medical management recommended and possible PCI of distal RCA branches in the future if recurrent anginal symptoms. - initial EKG showed sinus tachycardia, HR 102 with LVH and associated repol abnormality along anterior leads and TWI along lateral leads (noted on prior tracings). Initial Troponin 0.02 with repeat Troponin T of 0.49. hs troponin pk 1419.  Will discuss with Dr. Harrell Gave  Consider cath - on Heparin  with high hs Troponin -  Will start Imdur 30mg  daily. Continue ASA, BB, and statin therapy. If EF reduced or enzymes trending upwards, would consider repeat coronary angiography.   3. HTN - BP at 140/87 upon arrival to [facility]. Toprol-XL 12.5mg  daily and add Imdur 30mg  daily. And today 180/88   4. HLD - continue PTA Crestor 5mg  daily. Goal LDL less than 70 with known CAD.  5. Stage 3 CKD - baseline 1.3 - 1.4. at 1.42 upon admission at [facility]. on recheck BMET today Cr 1.15   For questions or updates, please contact [place] Please consult [email or web address] for contact info under        Signed, Cecilie Kicks, NP  11/20/2018, 7:53 AM

## 2018-11-20 NOTE — Progress Notes (Signed)
Progress Note  Patient Name: Susan [name] Date of Encounter: 11/20/2018  Primary Cardiologist: Prentice Docker, MD   Subjective   No further pain and no SOB, able to lie almost flat without SOB  Inpatient Medications    Scheduled Meds: . aspirin EC  81 mg Oral Daily  . furosemide  40 mg Intravenous Daily  . influenza vaccine adjuvanted  0.5 mL Intramuscular Tomorrow-1000  . isosorbide mononitrate  15 mg Oral Daily  . metoprolol succinate  12.5 mg Oral Daily  . pneumococcal 23 valent vaccine  0.5 mL Intramuscular Tomorrow-1000  . rosuvastatin  5 mg Oral q1800   Continuous Infusions: . heparin 450 Units/hr (11/19/18 2000)   PRN Meds: acetaminophen, nitroGLYCERIN, ondansetron (ZOFRAN) IV   Vital Signs    Vitals:   11/19/18 1610 11/19/18 2022 11/20/18 0055 11/20/18 0450  BP: (!) 163/91 (!) 147/89 (!) 180/90 (!) 180/88  Pulse: 72 76 75 68  Resp: 16 16 16 18   Temp: 98.3 F (36.8 C) 98.4 F (36.9 C) 98.3 F (36.8 C) 98.3 F (36.8 C)  TempSrc: Oral Oral Oral Oral  SpO2: 97% 95%  92%  Weight:   35.6 kg   Height:        Intake/Output Summary (Last 24 hours) at 11/20/2018 0753 Last data filed at 11/20/2018 0452 Gross per 24 hour  Intake 1204.01 ml  Output 1800 ml  Net -595.99 ml   Last 3 Weights 11/20/2018 11/19/2018 02/01/2018  Weight (lbs) 78 lb 8 oz 81 lb 4.8 oz 82 lb  Weight (kg) 35.607 kg 36.877 kg 37.195 kg      Telemetry    SR with PACs - Personally Reviewed  ECG    No new - Personally Reviewed  Physical Exam   GEN: No acute distress.   Neck: + JVD Cardiac: RRR, no murmurs, rubs, or gallops.  Respiratory: Clear to diminished to auscultation bilaterally. GI: Soft, nontender, non-distended  MS: No edema; No deformity. Neuro:  Nonfocal  Psych: Normal affect   Labs    High Sensitivity Troponin:   Recent Labs  Lab 11/19/18 1151 11/19/18 1403  TROPONINIHS 1,296* 1,419*      Chemistry Recent Labs  Lab 11/19/18 1151 11/20/18 0516  NA  137 136  K 3.3* 3.7  CL 91* 96*  CO2 33* 30  GLUCOSE 89 105*  BUN 16 14  CREATININE 1.19* 1.15*  CALCIUM 9.2 8.8*  GFRNONAA 45* 47*  GFRAA 52* 54*  ANIONGAP 13 10     Hematology Recent Labs  Lab 11/19/18 1403 11/20/18 0516  WBC 7.5 6.9  RBC 4.52 4.62  HGB 14.2 14.5  HCT 43.1 44.0  MCV 95.4 95.2  MCH 31.4 31.4  MCHC 32.9 33.0  RDW 14.5 14.4  PLT 201 204    BNP Recent Labs  Lab 11/19/18 1151  BNP 1,855.0*     DDimer No results for input(s): DDIMER in the last 168 hours.   Radiology    No results found.  Cardiac Studies   IMPRESSIONS    1. Left ventricular ejection fraction, by visual estimation, is 30%. The left ventricle has severely decreased function. There is mildly increased left ventricular hypertrophy.  2. Multiple segmental abnormalities exist. See findings.  3. Left ventricular diastolic parameters are consistent with Grade I diastolic dysfunction (impaired relaxation).  4. Global right ventricle has moderately reduced systolic function.The right ventricular size is normal. No increase in right ventricular wall thickness.  5. Left atrial size was mildly dilated.  6. Right  atrial size was normal.  7. Mild aortic valve annular calcification.  8. The mitral valve is grossly normal. Mild mitral valve regurgitation.  9. The tricuspid valve is grossly normal. Tricuspid valve regurgitation is mild. 10. The aortic valve is tricuspid. Aortic valve regurgitation is not visualized. Mild aortic valve sclerosis without stenosis. 11. The pulmonic valve was grossly normal. Pulmonic valve regurgitation is not visualized. 12. Moderately elevated pulmonary artery systolic pressure. 13. The tricuspid regurgitant velocity is 3.09 m/s, and with an assumed right atrial pressure of 3 mmHg, the estimated right ventricular systolic pressure is moderately elevated at 41.2 mmHg. 14. The inferior vena cava is normal in size with greater than 50% respiratory variability,  suggesting right atrial pressure of 3 mmHg.  FINDINGS  Left Ventricle: Left ventricular ejection fraction, by visual estimation, is 30%. The left ventricle has severely decreased function. The left ventricular internal cavity size was the left ventricle is normal in size. There is mildly increased left  ventricular hypertrophy. Left ventricular diastolic parameters are consistent with Grade I diastolic dysfunction (impaired relaxation).    LV Wall Scoring: The basal and mid anterolateral wall, basal and mid inferior wall, and basal and mid inferolateral wall are akinetic. The LAD distribution, entire septum, and entire apex are hypokinetic.  Right Ventricle: The right ventricular size is normal. No increase in right ventricular wall thickness. Global RV systolic function is has moderately reduced systolic function. The tricuspid regurgitant velocity is 3.09 m/s, and with an assumed right  atrial pressure of 3 mmHg, the estimated right ventricular systolic pressure is moderately elevated at 41.2 mmHg.  Left Atrium: Left atrial size was mildly dilated.  Right Atrium: Right atrial size was normal in size  Pericardium: There is no evidence of pericardial effusion.  Mitral Valve: The mitral valve is grossly normal. There is mild thickening of the mitral valve leaflet(s). Mild mitral valve regurgitation.  Tricuspid Valve: The tricuspid valve is grossly normal. Tricuspid valve regurgitation is mild.  Aortic Valve: The aortic valve is tricuspid. Aortic valve regurgitation is not visualized. Mild aortic valve sclerosis is present, with no evidence of aortic valve stenosis. Mild aortic valve annular calcification.  Pulmonic Valve: The pulmonic valve was grossly normal. Pulmonic valve regurgitation is not visualized.  Aorta: The aortic root is normal in size and structure.  Venous: The inferior vena cava is normal in size with greater than 50% respiratory variability, suggesting right  atrial pressure of 3 mmHg.  IAS/Shunts: No atrial level shunt detected by color flow Doppler.       Patient Profile     73 y.o. female with past medical history of CAD (s/p CABG x5 in 2000 with cath in 02/2017 showing 2/5 patent grafts with patent LIMA-LAD, patent SVG-distal RCA, occluded SVG-D1 and occluded seq-SVG-OM1-OM2 with medical management recommended and possible PCI of distal RCA branches in the future if recurrent anginal symptoms), chronic combined systolic and diastolic (EF 20-25% by echo in 12/2016, at 45-50% by echo in 05/2017), HTN, HLD, COPD and tobacco use now transferred from [facility] with NSTEMI.    Assessment & Plan    1. Acute CHF Exacerbation with new decrease in EF to 30% - EF previously 20-25% by echo in 12/2016, at 45-50% by echo in 05/2017 and now 30%.  Pro BNP 12062. Initial Troponin 0.02 with repeat Troponin T of 0.49. Was diuresed while at [facility] and appears euvolemic by examination.  Consider resuming PO Lasix tomorrow pending results.  - echocardiogram with decrease in EF   to 30%. Continue Toprol-XL. If EF reduced again, would recommend restarting Entresto as EF previously improved in 2019 with medical therapy (on Lisinopril PTA but it does not appear this was administered at [facility] but would allow 36 hour washout from this admission to confirm).    2. CAD/Elevated Troponin - s/p CABG x5 in 2000 with cath in 02/2017 showing 2/5 patent grafts with patent LIMA-LAD, patent SVG-distal RCA, occluded SVG-D1 and occluded seq-SVG-OM1-OM2 with medical management recommended and possible PCI of distal RCA branches in the future if recurrent anginal symptoms. - initial EKG showed sinus tachycardia, HR 102 with LVH and associated repol abnormality along anterior leads and TWI along lateral leads (noted on prior tracings). Initial Troponin 0.02 with repeat Troponin T of 0.49. hs troponin pk 1419.  Will discuss with Dr. Harrell Gave  Consider cath - on Heparin  with high hs Troponin -  Will start Imdur 30mg  daily. Continue ASA, BB, and statin therapy. If EF reduced or enzymes trending upwards, would consider repeat coronary angiography.   3. HTN - BP at 140/87 upon arrival to [facility]. Toprol-XL 12.5mg  daily and add Imdur 30mg  daily. And today 180/88   4. HLD - continue PTA Crestor 5mg  daily. Goal LDL less than 70 with known CAD.  5. Stage 3 CKD - baseline 1.3 - 1.4. at 1.42 upon admission at [facility]. on recheck BMET today Cr 1.15   For questions or updates, please contact [place] Please consult [email or web address] for contact info under        Signed, Cecilie Kicks, NP  11/20/2018, 7:53 AM

## 2018-11-20 NOTE — Progress Notes (Signed)
Site area: right groin  Site Prior to Removal:  Level 0  Pressure Applied For 20  MINUTES    Minutes Beginning at 1650  Manual:   Yes.    Patient Status During Pull:  Stable  Post Pull Groin Site:  Level 0  Post Pull Instructions Given:  Yes.    Post Pull Pulses Present:  Yes.    Dressing Applied:  Yes.    Comments:  Bed rest started at 1710

## 2018-11-21 ENCOUNTER — Encounter (HOSPITAL_COMMUNITY): Payer: Self-pay | Admitting: Internal Medicine

## 2018-11-21 DIAGNOSIS — I2510 Atherosclerotic heart disease of native coronary artery without angina pectoris: Secondary | ICD-10-CM

## 2018-11-21 DIAGNOSIS — E785 Hyperlipidemia, unspecified: Secondary | ICD-10-CM

## 2018-11-21 DIAGNOSIS — I10 Essential (primary) hypertension: Secondary | ICD-10-CM

## 2018-11-21 DIAGNOSIS — I255 Ischemic cardiomyopathy: Secondary | ICD-10-CM

## 2018-11-21 DIAGNOSIS — N183 Chronic kidney disease, stage 3 unspecified: Secondary | ICD-10-CM

## 2018-11-21 DIAGNOSIS — I5043 Acute on chronic combined systolic (congestive) and diastolic (congestive) heart failure: Secondary | ICD-10-CM

## 2018-11-21 HISTORY — DX: Hyperlipidemia, unspecified: E78.5

## 2018-11-21 HISTORY — DX: Chronic kidney disease, stage 3 unspecified: N18.30

## 2018-11-21 HISTORY — DX: Acute on chronic combined systolic (congestive) and diastolic (congestive) heart failure: I50.43

## 2018-11-21 HISTORY — DX: Essential (primary) hypertension: I10

## 2018-11-21 LAB — CBC
HCT: 43.8 % (ref 36.0–46.0)
Hemoglobin: 14.7 g/dL (ref 12.0–15.0)
MCH: 32.3 pg (ref 26.0–34.0)
MCHC: 33.6 g/dL (ref 30.0–36.0)
MCV: 96.3 fL (ref 80.0–100.0)
Platelets: 197 10*3/uL (ref 150–400)
RBC: 4.55 MIL/uL (ref 3.87–5.11)
RDW: 14.6 % (ref 11.5–15.5)
WBC: 6.4 10*3/uL (ref 4.0–10.5)
nRBC: 0 % (ref 0.0–0.2)

## 2018-11-21 MED ORDER — METOPROLOL SUCCINATE ER 25 MG PO TB24: 12.5000 mg | ORAL_TABLET | Freq: Every day | ORAL | 6 refills | Status: DC

## 2018-11-21 MED ORDER — ISOSORBIDE MONONITRATE ER 30 MG PO TB24: 15.0000 mg | ORAL_TABLET | Freq: Every day | ORAL | 6 refills | Status: DC

## 2018-11-21 MED ORDER — ROSUVASTATIN CALCIUM 10 MG PO TABS: 10.0000 mg | ORAL_TABLET | Freq: Every day | ORAL | 6 refills | Status: DC

## 2018-11-21 MED ORDER — CLOPIDOGREL BISULFATE 75 MG PO TABS: 75.0000 mg | ORAL_TABLET | Freq: Every day | ORAL | 11 refills | Status: DC

## 2018-11-21 MED ORDER — HYDRALAZINE HCL 25 MG PO TABS: 25.0000 mg | ORAL_TABLET | Freq: Three times a day (TID) | ORAL | 6 refills | Status: DC

## 2018-11-21 NOTE — Discharge Instructions (Signed)
Call [facility] at [phone] if any bleeding, swelling or drainage at cath site.  May shower, no tub baths for 48 hours for groin sticks. No lifting over 5 pounds for 3 days.  No Driving for 3 days if you drive  Heart Healthy low salt diet    Weigh daily and call the office if wt increases by 3 pounds in a day or 5 pounds in a week  You have new medications to take.  Read instructions carefully.   We changed the metoprolol to another type so take new one and not old one.  Furosemide is only once per day.

## 2018-11-21 NOTE — Progress Notes (Signed)
Susan [name] to be D/C'd Home per MD order.  Discussed with the patient and all questions fully answered.  VSS, Skin clean, dry and intact without evidence of skin break down, no evidence of skin tears noted.  IV catheter discontinued intact. Site without signs and symptoms of complications. Dressing and pressure applied.  An After Visit Summary was printed and given to the patient. Patient received prescription.  D/c education completed with patient/family including follow up instructions, medication list, d/c activities limitations if indicated, with other d/c instructions as indicated by MD - patient able to verbalize understanding, all questions fully answered.   Patient instructed to return to ED, call 911, or call MD for any changes in condition.   Patient escorted via WC to main entrance by NT, and D/C home via private (husband) auto.  Howard Pouch 11/21/2018 6:43 PM

## 2018-11-21 NOTE — Progress Notes (Signed)
Progress Note  Patient Name: Susan [name] Date of Encounter: 11/21/2018  Primary Cardiologist: Prentice Docker, MD   Subjective   No chest pain and no SOB  Feels well has walked once and now walking again.   Inpatient Medications    Scheduled Meds: . aspirin EC  81 mg Oral Daily  . clopidogrel  75 mg Oral Q breakfast  . heparin  5,000 Units Subcutaneous Q8H  . hydrALAZINE  25 mg Oral TID  . influenza vaccine adjuvanted  0.5 mL Intramuscular Tomorrow-1000  . isosorbide mononitrate  15 mg Oral Daily  . metoprolol succinate  12.5 mg Oral Daily  . pneumococcal 23 valent vaccine  0.5 mL Intramuscular Tomorrow-1000  . rosuvastatin  10 mg Oral q1800  . sodium chloride flush  3 mL Intravenous Q12H  . sodium chloride flush  3 mL Intravenous Q12H   Continuous Infusions: . sodium chloride     PRN Meds: sodium chloride, acetaminophen, nitroGLYCERIN, ondansetron (ZOFRAN) IV, sodium chloride flush   Vital Signs    Vitals:   11/21/18 0105 11/21/18 0136 11/21/18 0406 11/21/18 0831  BP: 127/78  (!) 141/75 (!) 155/75  Pulse: 71  75 70  Resp: 20  19   Temp: 98.3 F (36.8 C)  98 F (36.7 C) 98.1 F (36.7 C)  TempSrc: Oral  Oral Oral  SpO2: 94%  95% 96%  Weight:  35.5 kg    Height:        Intake/Output Summary (Last 24 hours) at 11/21/2018 1055 Last data filed at 11/21/2018 0950 Gross per 24 hour  Intake 643 ml  Output 1550 ml  Net -907 ml   Last 3 Weights 11/21/2018 11/20/2018 11/19/2018  Weight (lbs) 78 lb 3.2 oz 78 lb 8 oz 81 lb 4.8 oz  Weight (kg) 35.471 kg 35.607 kg 36.877 kg      Telemetry    SR with PACs - Personally Reviewed  ECG    No changes - Personally Reviewed  Physical Exam   GEN: No acute distress.   Neck: No JVD Cardiac: RRR, no murmurs, rubs, or gallops.  Respiratory: Clear to auscultation bilaterally though diminished in bases. GI: Soft, nontender, non-distended  MS: No edema; No deformity. Neuro:  Nonfocal  Psych: Normal affect    Labs    High Sensitivity Troponin:   Recent Labs  Lab 11/19/18 1151 11/19/18 1403  TROPONINIHS 1,296* 1,419*      Chemistry Recent Labs  Lab 11/19/18 1151 11/20/18 0516  NA 137 136  K 3.3* 3.7  CL 91* 96*  CO2 33* 30  GLUCOSE 89 105*  BUN 16 14  CREATININE 1.19* 1.15*  CALCIUM 9.2 8.8*  GFRNONAA 45* 47*  GFRAA 52* 54*  ANIONGAP 13 10     Hematology Recent Labs  Lab 11/19/18 1403 11/20/18 0516 11/21/18 0454  WBC 7.5 6.9 6.4  RBC 4.52 4.62 4.55  HGB 14.2 14.5 14.7  HCT 43.1 44.0 43.8  MCV 95.4 95.2 96.3  MCH 31.4 31.4 32.3  MCHC 32.9 33.0 33.6  RDW 14.5 14.4 14.6  PLT 201 204 197    BNP Recent Labs  Lab 11/19/18 1151  BNP 1,855.0*     DDimer No results for input(s): DDIMER in the last 168 hours.   Radiology    No results found.  Cardiac Studies   11/20/18 Cardiac cath  Conclusions: 1. Overall stable appearance of the coronary arteries with severe three-vessel coronary artery disease, including chronic total occlusions of the mid LAD, mid LCx,  and proximal RCA. 2. Widely patent LIMA-LAD. 3. Patent SVG-distal RCA with up to 50% stenosis in the mid graft.  Bifurcation disease involving the distal RCA is similar to prior catheterization, with 90% lesions involving the ostial/proximal rPDA and ostial/proximal RCA continuation. 4. Normal left ventricular filling pressure.   Recommendations: 1. Medical optimization, including escalation of evidence-based heart failure therapy, improved blood pressure control, and smoking cessation. 2. Dual antiplatelet therapy with aspirin and clopidogrel for at least 12 months, if tolerated. 3. If symptoms recur, complex PCI to the distal RCA bifurcation will need to be considered.  As previously noted, this may requiring "kissing stents" at the ostia of the rPDA and RCA continuation.  This would be a potentially high risk procedure, given the territory supplied (via native vessels and collaterals).  Echo 11/19/18  IMPRESSIONS    1. Left ventricular ejection fraction, by visual estimation, is 30%. The left ventricle has severely decreased function. There is mildly increased left ventricular hypertrophy.  2. Multiple segmental abnormalities exist. See findings.  3. Left ventricular diastolic parameters are consistent with Grade I diastolic dysfunction (impaired relaxation).  4. Global right ventricle has moderately reduced systolic function.The right ventricular size is normal. No increase in right ventricular wall thickness.  5. Left atrial size was mildly dilated.  6. Right atrial size was normal.  7. Mild aortic valve annular calcification.  8. The mitral valve is grossly normal. Mild mitral valve regurgitation.  9. The tricuspid valve is grossly normal. Tricuspid valve regurgitation is mild. 10. The aortic valve is tricuspid. Aortic valve regurgitation is not visualized. Mild aortic valve sclerosis without stenosis. 11. The pulmonic valve was grossly normal. Pulmonic valve regurgitation is not visualized. 12. Moderately elevated pulmonary artery systolic pressure. 13. The tricuspid regurgitant velocity is 3.09 m/s, and with an assumed right atrial pressure of 3 mmHg, the estimated right ventricular systolic pressure is moderately elevated at 41.2 mmHg. 14. The inferior vena cava is normal in size with greater than 50% respiratory variability, suggesting right atrial pressure of 3 mmHg.  FINDINGS  Left Ventricle: Left ventricular ejection fraction, by visual estimation, is 30%. The left ventricle has severely decreased function. The left ventricular internal cavity size was the left ventricle is normal in size. There is mildly increased left  ventricular hypertrophy. Left ventricular diastolic parameters are consistent with Grade I diastolic dysfunction (impaired relaxation).    LV Wall Scoring: The basal and mid anterolateral wall, basal and mid inferior wall, and basal and mid inferolateral  wall are akinetic. The LAD distribution, entire septum, and entire apex are hypokinetic.  Right Ventricle: The right ventricular size is normal. No increase in right ventricular wall thickness. Global RV systolic function is has moderately reduced systolic function. The tricuspid regurgitant velocity is 3.09 m/s, and with an assumed right  atrial pressure of 3 mmHg, the estimated right ventricular systolic pressure is moderately elevated at 41.2 mmHg.  Left Atrium: Left atrial size was mildly dilated.  Right Atrium: Right atrial size was normal in size  Pericardium: There is no evidence of pericardial effusion.  Mitral Valve: The mitral valve is grossly normal. There is mild thickening of the mitral valve leaflet(s). Mild mitral valve regurgitation.  Tricuspid Valve: The tricuspid valve is grossly normal. Tricuspid valve regurgitation is mild.  Aortic Valve: The aortic valve is tricuspid. Aortic valve regurgitation is not visualized. Mild aortic valve sclerosis is present, with no evidence of aortic valve stenosis. Mild aortic valve annular calcification.  Pulmonic Valve: The pulmonic valve  was grossly normal. Pulmonic valve regurgitation is not visualized.  Aorta: The aortic root is normal in size and structure.  Venous: The inferior vena cava is normal in size with greater than 50% respiratory variability, suggesting right atrial pressure of 3 mmHg.  IAS/Shunts: No atrial level shunt detected by color flow Doppler.      Patient Profile     73 y.o. female with past medical history of CAD (s/p CABG x5 in 2000 with cath in 02/2017 showing 2/5 patent grafts with patent LIMA-LAD, patent SVG-distal RCA, occluded SVG-D1 and occluded seq-SVG-OM1-OM2 with medical management recommended and possible PCI of distal RCA branches in the future if recurrent anginal symptoms), chronic combined systolic and diastolic (EF 20-25% by echo in 12/2016, at 45-50% by echo in 05/2017), HTN, HLD,  COPD and tobacco use now transferred from [facility] with NSTEMI  Assessment & Plan    1. Acute CHF Exacerbation with new decrease in EF to 30%/ICM  -EFpreviously20-25% by echo in 12/2016, at 45-50% by echo in 05/2017 and now 30%. Pro BNP 12062.  Was diuresed while at [facility] and appears euvolemic by examination.  - echocardiogram with decrease in EF to 30%. Continue Toprol-XL.? Entresto as EF previously improved in 2019 with medical therapy (was not on  Lisinopril PTA ) --was on lasix 40 mg BID prior to admit. Plan to add back once per day but will defer to Dr. Cristal Deer ? Add entresto or decide as outpt   2. CAD/Elevated Troponin -s/p CABG x5 in 2000 with cath in 02/2017 showing 2/5 patent grafts with patent LIMA-LAD, patent SVG-distal RCA, occluded SVG-D1 and occluded seq-SVG-OM1-OM2 with medical management recommended and possible PCI of distal RCA branches in the future if recurrent anginal symptoms. - initial EKG showedsinus tachycardia, HR 102 with LVH and associated repol abnormality along anterior leads and TWI along lateral leads (noted on prior tracings). Initial Troponin 0.02 with repeat Troponin T of 0.49. hs troponin pk 1419.  -  Imdur 15 mg daily. Continue ASA, BB, and statin therapy. DAPT for 12 months.   - cardiac cath stable and no PCI   "If symptoms recur, complex PCI to the distal RCA bifurcation will need to be considered.  As previously noted, this may requiring "kissing stents" at the ostia of the rPDA and RCA continuation.  This would be a potentially high risk procedure, given the territory supplied (via native vessels and collaterals)."   3. HTN - BP at 140/87 upon arrival to [facility]. Toprol-XL 12.5mg  daily pt was on lopressor 12.5 once daily as outpt--- now with hydralazine, Imdur 15mg  daily. And today 155/75 to 127/78  4. HLD - continue PTA Crestor 5mg  daily. Goal LDL less than 70 with known CAD.  5. Stage 3 CKD - baseline 1.3 - 1.4. at 1.42  upon admission at [facility]. on recheck BMET today Cr1.15  None today   Prior to admit on ABX and steroids for bronchitis but once at [facility] these were stopped.   Possible discharge today  For questions or updates, please contact [facility] Please consult [email or web address] for contact info under        Signed, Nada Boozer, NP  11/21/2018, 10:55 AM

## 2018-11-21 NOTE — Discharge Summary (Addendum)
Discharge Summary    Patient ID: Susan [name] MRN: [id number]; DOB: Jul 19, 1945  Admit date: 11/19/2018 Discharge date: 11/21/2018  Primary Care Provider: [name], MD  Primary Cardiologist: Prentice Docker, MD  Primary Electrophysiologist:  None   Discharge Diagnoses    Principal Problem:   NSTEMI (non-ST elevated myocardial infarction) [facility]) Active Problems:   Ischemic cardiomyopathy   Hx of CABG   Acute on chronic systolic and diastolic heart failure, NYHA class 1 (HCC)   CKD (chronic kidney disease) stage 3, GFR 30-59 ml/min   CAD in native artery   HTN (hypertension)   HLD (hyperlipidemia)   Diagnostic Studies/Procedures    11/20/18 Cardiac cath  Conclusions: Overall stable appearance of the coronary arteries with severe three-vessel coronary artery disease, including chronic total occlusions of the mid LAD, mid LCx, and proximal RCA. Widely patent LIMA-LAD. Patent SVG-distal RCA with up to 50% stenosis in the mid graft.  Bifurcation disease involving the distal RCA is similar to prior catheterization, with 90% lesions involving the ostial/proximal rPDA and ostial/proximal RCA continuation. Normal left ventricular filling pressure.   Recommendations: Medical optimization, including escalation of evidence-based heart failure therapy, improved blood pressure control, and smoking cessation. Dual antiplatelet therapy with aspirin and clopidogrel for at least 12 months, if tolerated. If symptoms recur, complex PCI to the distal RCA bifurcation will need to be considered.  As previously noted, this may requiring "kissing stents" at the ostia of the rPDA and RCA continuation.  This would be a potentially high risk procedure, given the territory supplied (via native vessels and collaterals).   Echo 11/19/18 IMPRESSIONS      1. Left ventricular ejection fraction, by visual estimation, is 30%. The left ventricle has severely decreased function. There is mildly increased  left ventricular hypertrophy.  2. Multiple segmental abnormalities exist. See findings.  3. Left ventricular diastolic parameters are consistent with Grade I diastolic dysfunction (impaired relaxation).  4. Global right ventricle has moderately reduced systolic function.The right ventricular size is normal. No increase in right ventricular wall thickness.  5. Left atrial size was mildly dilated.  6. Right atrial size was normal.  7. Mild aortic valve annular calcification.  8. The mitral valve is grossly normal. Mild mitral valve regurgitation.  9. The tricuspid valve is grossly normal. Tricuspid valve regurgitation is mild. 10. The aortic valve is tricuspid. Aortic valve regurgitation is not visualized. Mild aortic valve sclerosis without stenosis. 11. The pulmonic valve was grossly normal. Pulmonic valve regurgitation is not visualized. 12. Moderately elevated pulmonary artery systolic pressure. 13. The tricuspid regurgitant velocity is 3.09 m/s, and with an assumed right atrial pressure of 3 mmHg, the estimated right ventricular systolic pressure is moderately elevated at 41.2 mmHg. 14. The inferior vena cava is normal in size with greater than 50% respiratory variability, suggesting right atrial pressure of 3 mmHg.   FINDINGS  Left Ventricle: Left ventricular ejection fraction, by visual estimation, is 30%. The left ventricle has severely decreased function. The left ventricular internal cavity size was the left ventricle is normal in size. There is mildly increased left  ventricular hypertrophy. Left ventricular diastolic parameters are consistent with Grade I diastolic dysfunction (impaired relaxation).     LV Wall Scoring: The basal and mid anterolateral wall, basal and mid inferior wall, and basal and mid inferolateral wall are akinetic. The LAD distribution, entire septum, and entire apex are hypokinetic.   Right Ventricle: The right ventricular size is normal. No increase in right  ventricular wall thickness. Global RV  systolic function is has moderately reduced systolic function. The tricuspid regurgitant velocity is 3.09 m/s, and with an assumed right  atrial pressure of 3 mmHg, the estimated right ventricular systolic pressure is moderately elevated at 41.2 mmHg.   Left Atrium: Left atrial size was mildly dilated.   Right Atrium: Right atrial size was normal in size   Pericardium: There is no evidence of pericardial effusion.   Mitral Valve: The mitral valve is grossly normal. There is mild thickening of the mitral valve leaflet(s). Mild mitral valve regurgitation.   Tricuspid Valve: The tricuspid valve is grossly normal. Tricuspid valve regurgitation is mild.   Aortic Valve: The aortic valve is tricuspid. Aortic valve regurgitation is not visualized. Mild aortic valve sclerosis is present, with no evidence of aortic valve stenosis. Mild aortic valve annular calcification.   Pulmonic Valve: The pulmonic valve was grossly normal. Pulmonic valve regurgitation is not visualized.   Aorta: The aortic root is normal in size and structure.   Venous: The inferior vena cava is normal in size with greater than 50% respiratory variability, suggesting right atrial pressure of 3 mmHg.   IAS/Shunts: No atrial level shunt detected by color flow Doppler.   _____________   History of Present Illness     Susan [name] is a 73 y.o. female with  past medical history of CAD (s/p CABG x5 in 2000 with cath in 02/2017 showing 2/5 patent grafts with patent LIMA-LAD, patent SVG-distal RCA, occluded SVG-D1 and occluded seq-SVG-OM1-OM2 with medical management recommended and possible PCI of distal RCA branches in the future if recurrent anginal symptoms), chronic combined systolic and diastolic (EF 20-25% by echo in 12/2016, at 45-50% by echo in 05/2017), HTN, HLD, COPD and tobacco use.  Her meds had been decreased in past due to dizziness.  Per pt no longer on lisinopril nor entresto and  her BB was lopressor 12.5 once daily.     Recently seen by PCP and dx with bronchitis and placed on prednisone and ABX.  But her symptoms increased and she developed chest pain along with her dyspnea.  [facility] with increased chest pain and dyspnea.  Her EKG with LVH and sT and repol abnormality along ant. Leads and TWI along lateral leads, present on prior EKG.      ABG showed pH 7.46, pCO2 40 and pO2 52. WBC 12.5, Hgb 16.2, platelets 226, Na+ 137, K+ 3.7 and creatinine 1.42. Pro BNP 12062. Initial Troponin 0.02 with repeat Troponin T of 0.49. CXR showed pulmonary vascular congestion and cardiomegaly. Appears she was started on IV Lasix 40mg  upon admission.  [place] was [place]ed in [place] and then [facility].  Hospital Course     Consultants: none   Pt was euvolemic on arrival.  Her troponin hs was 1419.  She was placed on IV heparin and echo ordered with new drop in EF.  Plans for cardiac cath which she underwent on 11/20/18 with no acute changes.  Plan to treat medically with same thoughts to do PCI if absolutely needed.    For decreased EF her meds have been adjusted. Imdur, hydralazine and plavix are new.  Decreased lasix to 40 mg once per day.   Today she has walked with cardiac rehab and did well.  She has been seen and evaluated by Dr. Cristal Deerhristopher and is stable for discharge.  She will follow up with Dr. Darl HouseholderKoneswaren in [place].   Discharge wt is 35.5 Kg. Neg 663.during  [facility] stay.   Could consider entresto vs ACE or ARB as outpt.  Also consider increasing crestor to higher dose if she tolerates the 10 mg.    Did the patient have an acute coronary syndrome (MI, NSTEMI, STEMI, etc) this admission?:  Yes                               AHA/ACC Clinical Performance & Quality Measures: Aspirin prescribed? - Yes ADP Receptor Inhibitor (Plavix/Clopidogrel, Brilinta/Ticagrelor or Effient/Prasugrel) prescribed (includes medically managed patients)? -  Yes Beta Blocker prescribed? - Yes High Intensity Statin (Lipitor 40-80mg  or Crestor 20-40mg ) prescribed? - no pt has difficulty with higher dose - if tolerates 10 may increase as outpt EF assessed during THIS hospitalization? - Yes For EF <40%, was ACEI/ARB prescribed? - No - Reason:  may add as outpt , has had difficulty prior with renal insuff  For EF <40%, Aldosterone Antagonist (Spironolactone or Eplerenone) prescribed? - No - Reason:  renal insuff Cardiac Rehab Phase II ordered (Included Medically managed Patients)? - Yes   _____________  Discharge Vitals Blood pressure (!) 143/77, pulse 70, temperature 98.8 F (37.1 C), temperature source Oral, resp. rate 16, height 5' (1.524 m), weight 35.5 kg, SpO2 99 %.  Filed Weights   11/19/18 1110 11/20/18 0055 11/21/18 0136  Weight: 36.9 kg 35.6 kg 35.5 kg    Labs & Radiologic Studies    CBC Recent Labs    11/20/18 0516 11/21/18 0454  WBC 6.9 6.4  HGB 14.5 14.7  HCT 44.0 43.8  MCV 95.2 96.3  PLT 204 024   Basic Metabolic Panel Recent Labs    11/19/18 1151 11/20/18 0516  NA 137 136  K 3.3* 3.7  CL 91* 96*  CO2 33* 30  GLUCOSE 89 105*  BUN 16 14  CREATININE 1.19* 1.15*  CALCIUM 9.2 8.8*   Liver Function Tests No results for input(s): AST, ALT, ALKPHOS, BILITOT, PROT, ALBUMIN in the last 72 hours. No results for input(s): LIPASE, AMYLASE in the last 72 hours. High Sensitivity Troponin:   Recent Labs  Lab 11/19/18 1151 11/19/18 1403  TROPONINIHS 1,296* 1,419*    BNP Invalid input(s): POCBNP D-Dimer No results for input(s): DDIMER in the last 72 hours. Hemoglobin A1C No results for input(s): HGBA1C in the last 72 hours. Fasting Lipid Panel Recent Labs    11/20/18 0516  CHOL 198  HDL 77  LDLCALC 98  TRIG 114  CHOLHDL 2.6   Thyroid Function Tests No results for input(s): TSH, T4TOTAL, T3FREE, THYROIDAB in the last 72 hours.  Invalid input(s): FREET3 _____________  No results found. Disposition   Pt  is being discharged home today in good condition.  Follow-up Plans & Appointments   Call [facility] at [phone] if any bleeding, swelling or drainage at cath site.  May shower, no tub baths for 48 hours for groin sticks. No lifting over 5 pounds for 3 days.  No Driving for 3 days if you drive  Heart Healthy low salt diet    Weigh daily and call the office if wt increases by 3 pounds in a day or 5 pounds in a week  You have new medications to take.  Read instructions carefully.    Follow-up Information     Herminio Commons, MD Follow up on 12/11/2018.   Specialty: Cardiology Why: at 10:20 AM  Contact information: [place]  [phone]           Discharge Instructions     Amb Referral to Cardiac Rehabilitation   Complete by: As directed    Diagnosis: NSTEMI   After initial evaluation and assessments completed: Virtual Based Care may be provided alone or in conjunction with Phase 2 Cardiac Rehab based on patient barriers.: No       Discharge Medications   Allergies as of 11/21/2018   No Known Allergies      Medication List     TAKE these medications    aspirin EC 81 MG tablet Take 81 mg by mouth daily.   clopidogrel 75 MG tablet Commonly known as: PLAVIX Take 1 tablet (75 mg total) by mouth daily with breakfast. Start taking on: November 22, 2018   furosemide 40 MG tablet Commonly known as: LASIX Take 1 tablet (40 mg total) by mouth daily.   hydrALAZINE 25 MG tablet Commonly known as: APRESOLINE Take 1 tablet (25 mg total) by mouth 3 (three) times daily.   isosorbide mononitrate 30 MG 24 hr tablet Commonly known as: IMDUR Take 0.5 tablets (15 mg total) by mouth daily. Start taking on: November 22, 2018   Klor-Con M20 20 MEQ tablet Generic drug: potassium chloride SA TAKE 1 TABLET BY MOUTH EVERY DAY What changed: how much to take   metoprolol succinate 25 MG 24 hr tablet Commonly  known as: TOPROL-XL Take 0.5 tablets (12.5 mg total) by mouth daily. Start taking on: November 22, 2018   multivitamin tablet Take 1 tablet by mouth daily.   nitroGLYCERIN 0.4 MG SL tablet Commonly known as: NITROSTAT Place 1 tablet (0.4 mg total) under the tongue every 5 (five) minutes x 3 doses as needed for chest pain.   rosuvastatin 10 MG tablet Commonly known as: CRESTOR Take 1 tablet (10 mg total) by mouth daily at 6 PM. What changed:  medication strength how much to take when to take this   vitamin B-12 250 MCG tablet Commonly known as: CYANOCOBALAMIN Take 250 mcg by mouth daily.           Outstanding Labs/Studies   BMP   Duration of Discharge Encounter   Greater than 30 minutes including physician time.  Signed, Nada Boozer, NP 11/21/2018, 3:31 PM

## 2018-11-21 NOTE — Progress Notes (Signed)
CARDIAC REHAB PHASE I   PRE:  Rate/Rhythm: 73 SR  BP:  Sitting: 155/77      SaO2: 95 RA  MODE:  Ambulation: 430 ft   POST:  Rate/Rhythm: 93 SR  BP:  Sitting: 184/94    SaO2: 96 RA  Pt ambulated 426ft in hallway handheld assist with slow, steady gait. Pt denies CP or SOB, did c/o some weak legs towards end of walk. Pt returned to recliner. Reviewed importance of ASA and Plavix. Pt given heart healthy diet, smoking cessation tip sheet, and exercise guidelines. Reviewed site care, restrictions, and importance of monitoring symptoms. Will refer to CRP II Arthur.   [id number] Rufina Falco, RN BSN 11/21/2018 9:05 AM

## 2018-11-21 NOTE — Plan of Care (Signed)

## 2018-11-28 DIAGNOSIS — J449 Chronic obstructive pulmonary disease, unspecified: Secondary | ICD-10-CM | POA: Diagnosis not present

## 2018-11-28 DIAGNOSIS — I10 Essential (primary) hypertension: Secondary | ICD-10-CM | POA: Diagnosis not present

## 2018-12-11 ENCOUNTER — Other Ambulatory Visit: Payer: Self-pay

## 2018-12-11 ENCOUNTER — Encounter: Payer: Self-pay | Admitting: Cardiovascular Disease

## 2018-12-11 ENCOUNTER — Ambulatory Visit (INDEPENDENT_AMBULATORY_CARE_PROVIDER_SITE_OTHER): Payer: Medicare PPO | Admitting: Cardiovascular Disease

## 2018-12-11 VITALS — BP 158/84 | HR 60 | Ht 60.0 in | Wt 84.0 lb

## 2018-12-11 DIAGNOSIS — I5022 Chronic systolic (congestive) heart failure: Secondary | ICD-10-CM

## 2018-12-11 DIAGNOSIS — I255 Ischemic cardiomyopathy: Secondary | ICD-10-CM

## 2018-12-11 DIAGNOSIS — Z720 Tobacco use: Secondary | ICD-10-CM

## 2018-12-11 DIAGNOSIS — I10 Essential (primary) hypertension: Secondary | ICD-10-CM

## 2018-12-11 DIAGNOSIS — I519 Heart disease, unspecified: Secondary | ICD-10-CM

## 2018-12-11 DIAGNOSIS — I25118 Atherosclerotic heart disease of native coronary artery with other forms of angina pectoris: Secondary | ICD-10-CM | POA: Diagnosis not present

## 2018-12-11 MED ORDER — FUROSEMIDE 40 MG PO TABS: 40.0000 mg | ORAL_TABLET | Freq: Every day | ORAL | 3 refills | Status: DC

## 2018-12-11 MED ORDER — POTASSIUM CHLORIDE CRYS ER 20 MEQ PO TBCR: 20.0000 meq | EXTENDED_RELEASE_TABLET | Freq: Every day | ORAL | 3 refills | Status: DC

## 2018-12-11 MED ORDER — HYDRALAZINE HCL 25 MG PO TABS: 25.0000 mg | ORAL_TABLET | Freq: Three times a day (TID) | ORAL | 3 refills | Status: DC

## 2018-12-11 NOTE — Progress Notes (Signed)
SUBJECTIVE: The patient presents for follow-up after sustaining a non-STEMI.  She was discharged on 11/21/2018.  Cardiac catheterization demonstrated overall stable appearance of the coronary vessels and grafts.  Medical management was recommended with dual antiplatelet therapy for a minimum of 12 months if tolerated.  Echocardiogram demonstrated severely reduced left ventricular systolic function, EF 30%.  There was grade 1 diastolic dysfunction.  She has occasional chest pains.  She did not receive the prescription for hydralazine.  Blood pressure is elevated today.  She is occasionally orthopneic.  She does have some leg swelling.  It has been reinforced that she needs to quit smoking.    Review of Systems: As per "subjective", otherwise negative.  No Known Allergies  Current Outpatient Medications  Medication Sig Dispense Refill  . aspirin EC 81 MG tablet Take 81 mg by mouth daily.    . clopidogrel (PLAVIX) 75 MG tablet Take 1 tablet (75 mg total) by mouth daily with breakfast. 30 tablet 11  . furosemide (LASIX) 40 MG tablet Take 1 tablet (40 mg total) by mouth daily. 30 tablet 0  . hydrALAZINE (APRESOLINE) 25 MG tablet Take 1 tablet (25 mg total) by mouth 3 (three) times daily. 90 tablet 6  . isosorbide mononitrate (IMDUR) 30 MG 24 hr tablet Take 0.5 tablets (15 mg total) by mouth daily. 15 tablet 6  . KLOR-CON M20 20 MEQ tablet TAKE 1 TABLET BY MOUTH EVERY DAY (Patient taking differently: Take 20 mEq by mouth daily. ) 90 tablet 1  . levofloxacin (LEVAQUIN) 500 MG tablet Take 500 mg by mouth daily.    . metoprolol succinate (TOPROL-XL) 25 MG 24 hr tablet Take 0.5 tablets (12.5 mg total) by mouth daily. 15 tablet 6  . Multiple Vitamin (MULTIVITAMIN) tablet Take 1 tablet by mouth daily.    . nitroGLYCERIN (NITROSTAT) 0.4 MG SL tablet Place 1 tablet (0.4 mg total) under the tongue every 5 (five) minutes x 3 doses as needed for chest pain. 25 tablet 3  . predniSONE (DELTASONE)  10 MG tablet Take 10 mg by mouth 2 (two) times daily with a meal.    . rosuvastatin (CRESTOR) 10 MG tablet Take 1 tablet (10 mg total) by mouth daily at 6 PM. 30 tablet 6  . vitamin B-12 (CYANOCOBALAMIN) 250 MCG tablet Take 250 mcg by mouth daily.     No current facility-administered medications for this visit.     Past Medical History:  Diagnosis Date  . Acute on chronic systolic and diastolic heart failure, NYHA class 1 (HCC) 11/21/2018  . Anxiety   . CAD (coronary artery disease)    a. s/p CABG x5 in 2000 b. cath in 02/2017 showing 2/5 patent grafts with patent LIMA-LAD, patent SVG-distal RCA, occluded SVG-D1 and occluded seq-SVG-OM1-OM2 with medical management recommended and possible PCI of distal RCA branches in the future if recurrent anginal symptoms  . CHF (congestive heart failure) (HCC)    a. EF 20-25% by echo in 12/2016 b. EF 45-50% by echo in 05/2017  . Chronic bronchitis (HCC)   . CKD (chronic kidney disease) stage 3, GFR 30-59 ml/min 11/21/2018  . COPD (chronic obstructive pulmonary disease) (HCC)   . Dizziness   . Esophageal reflux   . Essential hypertension   . Fatigue   . HLD (hyperlipidemia) 11/21/2018  . HTN (hypertension) 11/21/2018  . Hypercholesteremia   . Hyperkalemia   . Ovarian failure   . SOB (shortness of breath)     Past Surgical History:  Procedure Laterality Date  . ABDOMINAL HYSTERECTOMY    . APPENDECTOMY    . BREAST BIOPSY Right   . CARDIAC CATHETERIZATION  2000  . CORONARY ARTERY BYPASS GRAFT  2000   CABG X5  . LEFT HEART CATH AND CORS/GRAFTS ANGIOGRAPHY N/A 02/17/2017   Procedure: LEFT HEART CATH AND CORS/GRAFTS ANGIOGRAPHY;  Surgeon: Burnell Blanks, MD;  Location: [facility];  Service: Cardiovascular;  Laterality: N/A;  . LEFT HEART CATH AND CORS/GRAFTS ANGIOGRAPHY N/A 11/20/2018   Procedure: LEFT HEART CATH AND CORS/GRAFTS ANGIOGRAPHY;  Surgeon: Nelva Bush, MD;  Location: [facility];  Service: Cardiovascular;   Laterality: N/A;    Social History   Socioeconomic History  . Marital status: Married    Spouse name: Not on file  . Number of children: Not on file  . Years of education: Not on file  . Highest education level: Not on file  Occupational History  . Not on file  Social Needs  . Financial resource strain: Not on file  . Food insecurity    Worry: Not on file    Inability: Not on file  . Transportation needs    Medical: Not on file    Non-medical: Not on file  Tobacco Use  . Smoking status: Current Every Day Smoker    Packs/day: 1.00    Years: 50.00    Pack years: 50.00    Types: Cigarettes    Last attempt to quit: 02/22/2017    Years since quitting: 1.8  . Smokeless tobacco: Never Used  Substance and Sexual Activity  . Alcohol use: No    Frequency: Never  . Drug use: No  . Sexual activity: Never  Lifestyle  . Physical activity    Days per week: Not on file    Minutes per session: Not on file  . Stress: Not on file  Relationships  . Social Herbalist on phone: Not on file    Gets together: Not on file    Attends religious service: Not on file    Active member of club or organization: Not on file    Attends meetings of clubs or organizations: Not on file    Relationship status: Not on file  . Intimate partner violence    Fear of current or ex partner: Not on file    Emotionally abused: Not on file    Physically abused: Not on file    Forced sexual activity: Not on file  Other Topics Concern  . Not on file  Social History Narrative  . Not on file     Vitals:   12/11/18 1407  BP: (!) 158/84  Pulse: 60  SpO2: 98%  Weight: 84 lb (38.1 kg)  Height: 5' (1.524 m)    Wt Readings from Last 3 Encounters:  12/11/18 84 lb (38.1 kg)  11/21/18 78 lb 3.2 oz (35.5 kg)  02/01/18 82 lb (37.2 kg)     PHYSICAL EXAM General: NAD HEENT: Normal. Neck: No JVD, no thyromegaly. Lungs: Clear to auscultation bilaterally with normal respiratory effort. CV:  Regular rate and rhythm, normal S1/S2, no S3/S4, no murmur.  Trace bilateral leg edema.   Abdomen: Soft, nontender, no distention.  Neurologic: Alert and oriented.  Psych: Normal affect. Skin: Normal. Musculoskeletal: No gross deformities.    11/20/18 Cardiac cath Conclusions: 1. Overall stable appearance of the coronary arteries with severe three-vessel coronary artery disease, including chronic total occlusions of the mid LAD, mid LCx, and proximal  RCA. 2. Widely patent LIMA-LAD. 3. Patent SVG-distal RCA with up to 50% stenosis in the mid graft. Bifurcation disease involving the distal RCA is similar to prior catheterization, with 90% lesions involving the ostial/proximal rPDA and ostial/proximal RCA continuation. 4. Normal left ventricular filling pressure.  Recommendations: 1. Medical optimization, including escalation of evidence-based heart failure therapy, improved blood pressure control, and smoking cessation. 2. Dual antiplatelet therapy with aspirin and clopidogrel for at least 12 months, if tolerated. 3. If symptoms recur, complex PCI to the distal RCA bifurcation will need to be considered. As previously noted, this may requiring "kissing stents" at the ostia of the rPDA and RCA continuation. This would be a potentially high risk procedure, given the territory supplied (via native vessels and collaterals).  Echo 11/19/18 IMPRESSIONS   1. Left ventricular ejection fraction, by visual estimation, is 30%. The left ventricle has severely decreased function. There is mildly increased left ventricular hypertrophy. 2. Multiple segmental abnormalities exist. See findings. 3. Left ventricular diastolic parameters are consistent with Grade I diastolic dysfunction (impaired relaxation). 4. Global right ventricle has moderately reduced systolic function.The right ventricular size is normal. No increase in right ventricular wall thickness. 5. Left atrial size was mildly  dilated. 6. Right atrial size was normal. 7. Mild aortic valve annular calcification. 8. The mitral valve is grossly normal. Mild mitral valve regurgitation. 9. The tricuspid valve is grossly normal. Tricuspid valve regurgitation is mild. 10. The aortic valve is tricuspid. Aortic valve regurgitation is not visualized. Mild aortic valve sclerosis without stenosis. 11. The pulmonic valve was grossly normal. Pulmonic valve regurgitation is not visualized. 12. Moderately elevated pulmonary artery systolic pressure. 13. The tricuspid regurgitant velocity is 3.09 m/s, and with an assumed right atrial pressure of 3 mmHg, the estimated right ventricular systolic pressure is moderately elevated at 41.2 mmHg. 14. The inferior vena cava is normal in size with greater than 50% respiratory variability, suggesting right atrial pressure of 3 mmHg.   Labs: Lab Results  Component Value Date/Time   K 3.7 11/20/2018 05:16 AM   BUN 14 11/20/2018 05:16 AM   CREATININE 1.15 (H) 11/20/2018 05:16 AM   HGB 14.7 11/21/2018 04:54 AM     Lipids: Lab Results  Component Value Date/Time   LDLCALC 98 11/20/2018 05:16 AM   CHOL 198 11/20/2018 05:16 AM   TRIG 114 11/20/2018 05:16 AM   HDL 77 11/20/2018 05:16 AM       ASSESSMENT AND PLAN: 1.  Chronic systolic heart failure: Cardiac catheterization and echocardiogram reviewed above.  LVEF 30%.  Continue Toprol-XL, hydralazine 25 mg 3 times daily and nitrates.  Continue Lasix 40 mg daily.  I will have her take Lasix 40 mg twice daily today and tomorrow and will have her double up on her potassium.  I instructed her to take an extra dose of Lasix and potassium for weight gain of 3 pounds in 24 hours.  She has not picked up hydralazine and I will make sure her pharmacy has it.  2.  Coronary artery disease: Cardiac catheterization results reviewed above.  Continue dual antiplatelet therapy for non-STEMI for a minimum of 12 months if tolerated with aspirin and  clopidogrel.  Continue Toprol-XL and statin therapy.  3.  Hypertension: Blood pressure is elevated.  She will start hydralazine 25 mg 3 times daily and I will make sure her pharmacy has it.  4.  Tobacco abuse disorder: Previously did not tolerate Chantix.  It has been reinforced that she needs to quit smoking.  Disposition: Follow up 3 months   Kate Sable, M.D., F.A.C.C.

## 2018-12-11 NOTE — Patient Instructions (Signed)
Medication Instructions:   Hydralazine 25mg  three times per day.  Potassium 27meq daily.  Please take Lasix & Potassium at 40 twice a day for today and tomorrow, then back to previous dosing.   Continue all other medications.    Labwork: none  Testing/Procedures: none  Follow-Up: 3 months   Any Other Special Instructions Will Be Listed Below (If Applicable).  If you need a refill on your cardiac medications before your next appointment, please call your pharmacy.

## 2019-01-09 DIAGNOSIS — J449 Chronic obstructive pulmonary disease, unspecified: Secondary | ICD-10-CM | POA: Diagnosis not present

## 2019-01-09 DIAGNOSIS — I10 Essential (primary) hypertension: Secondary | ICD-10-CM | POA: Diagnosis not present

## 2019-01-10 DIAGNOSIS — Z299 Encounter for prophylactic measures, unspecified: Secondary | ICD-10-CM | POA: Diagnosis not present

## 2019-01-10 DIAGNOSIS — R5383 Other fatigue: Secondary | ICD-10-CM | POA: Diagnosis not present

## 2019-01-10 DIAGNOSIS — Z0000 Encounter for general adult medical examination without abnormal findings: Secondary | ICD-10-CM | POA: Diagnosis not present

## 2019-01-10 DIAGNOSIS — Z1339 Encounter for screening examination for other mental health and behavioral disorders: Secondary | ICD-10-CM | POA: Diagnosis not present

## 2019-01-10 DIAGNOSIS — Z681 Body mass index (BMI) 19 or less, adult: Secondary | ICD-10-CM | POA: Diagnosis not present

## 2019-01-10 DIAGNOSIS — J449 Chronic obstructive pulmonary disease, unspecified: Secondary | ICD-10-CM | POA: Diagnosis not present

## 2019-01-10 DIAGNOSIS — I10 Essential (primary) hypertension: Secondary | ICD-10-CM | POA: Diagnosis not present

## 2019-01-10 DIAGNOSIS — Z7189 Other specified counseling: Secondary | ICD-10-CM | POA: Diagnosis not present

## 2019-01-10 DIAGNOSIS — Z1331 Encounter for screening for depression: Secondary | ICD-10-CM | POA: Diagnosis not present

## 2019-01-11 DIAGNOSIS — E7800 Pure hypercholesterolemia, unspecified: Secondary | ICD-10-CM | POA: Diagnosis not present

## 2019-01-11 DIAGNOSIS — Z79899 Other long term (current) drug therapy: Secondary | ICD-10-CM | POA: Diagnosis not present

## 2019-01-11 DIAGNOSIS — F419 Anxiety disorder, unspecified: Secondary | ICD-10-CM | POA: Diagnosis not present

## 2019-01-17 DIAGNOSIS — R531 Weakness: Secondary | ICD-10-CM | POA: Diagnosis not present

## 2019-01-17 DIAGNOSIS — Z5321 Procedure and treatment not carried out due to patient leaving prior to being seen by health care provider: Secondary | ICD-10-CM | POA: Diagnosis not present

## 2019-01-17 DIAGNOSIS — R002 Palpitations: Secondary | ICD-10-CM | POA: Diagnosis not present

## 2019-01-18 ENCOUNTER — Inpatient Hospital Stay (HOSPITAL_COMMUNITY)
Admission: EM | Admit: 2019-01-18 | Discharge: 2019-01-24 | DRG: 378 | Disposition: A | Discharge status: Home / Self Care | Payer: Medicare PPO | Attending: Internal Medicine | Admitting: Internal Medicine

## 2019-01-18 ENCOUNTER — Emergency Department (HOSPITAL_COMMUNITY): Payer: Medicare PPO

## 2019-01-18 ENCOUNTER — Encounter (INDEPENDENT_AMBULATORY_CARE_PROVIDER_SITE_OTHER): Payer: Self-pay

## 2019-01-18 ENCOUNTER — Other Ambulatory Visit: Payer: Self-pay

## 2019-01-18 DIAGNOSIS — F17210 Nicotine dependence, cigarettes, uncomplicated: Secondary | ICD-10-CM | POA: Diagnosis present

## 2019-01-18 DIAGNOSIS — F0390 Unspecified dementia without behavioral disturbance: Secondary | ICD-10-CM | POA: Diagnosis present

## 2019-01-18 DIAGNOSIS — D649 Anemia, unspecified: Secondary | ICD-10-CM | POA: Diagnosis not present

## 2019-01-18 DIAGNOSIS — R000 Tachycardia, unspecified: Secondary | ICD-10-CM | POA: Diagnosis not present

## 2019-01-18 DIAGNOSIS — R7401 Elevation of levels of liver transaminase levels: Secondary | ICD-10-CM | POA: Diagnosis present

## 2019-01-18 DIAGNOSIS — R531 Weakness: Secondary | ICD-10-CM | POA: Diagnosis not present

## 2019-01-18 DIAGNOSIS — J439 Emphysema, unspecified: Secondary | ICD-10-CM | POA: Diagnosis present

## 2019-01-18 DIAGNOSIS — R519 Headache, unspecified: Secondary | ICD-10-CM | POA: Diagnosis not present

## 2019-01-18 DIAGNOSIS — Z23 Encounter for immunization: Secondary | ICD-10-CM | POA: Diagnosis not present

## 2019-01-18 DIAGNOSIS — E876 Hypokalemia: Secondary | ICD-10-CM | POA: Diagnosis present

## 2019-01-18 DIAGNOSIS — I214 Non-ST elevation (NSTEMI) myocardial infarction: Secondary | ICD-10-CM | POA: Diagnosis not present

## 2019-01-18 DIAGNOSIS — T39395A Adverse effect of other nonsteroidal anti-inflammatory drugs [NSAID], initial encounter: Secondary | ICD-10-CM | POA: Diagnosis present

## 2019-01-18 DIAGNOSIS — K2971 Gastritis, unspecified, with bleeding: Secondary | ICD-10-CM | POA: Diagnosis not present

## 2019-01-18 DIAGNOSIS — Z90710 Acquired absence of both cervix and uterus: Secondary | ICD-10-CM

## 2019-01-18 DIAGNOSIS — K2970 Gastritis, unspecified, without bleeding: Secondary | ICD-10-CM | POA: Diagnosis not present

## 2019-01-18 DIAGNOSIS — I252 Old myocardial infarction: Secondary | ICD-10-CM

## 2019-01-18 DIAGNOSIS — S52532A Colles' fracture of left radius, initial encounter for closed fracture: Secondary | ICD-10-CM | POA: Diagnosis not present

## 2019-01-18 DIAGNOSIS — S52592A Other fractures of lower end of left radius, initial encounter for closed fracture: Secondary | ICD-10-CM | POA: Diagnosis not present

## 2019-01-18 DIAGNOSIS — T148XXA Other injury of unspecified body region, initial encounter: Secondary | ICD-10-CM

## 2019-01-18 DIAGNOSIS — S0990XA Unspecified injury of head, initial encounter: Secondary | ICD-10-CM | POA: Diagnosis not present

## 2019-01-18 DIAGNOSIS — Z79899 Other long term (current) drug therapy: Secondary | ICD-10-CM

## 2019-01-18 DIAGNOSIS — K219 Gastro-esophageal reflux disease without esophagitis: Secondary | ICD-10-CM | POA: Diagnosis present

## 2019-01-18 DIAGNOSIS — K2901 Acute gastritis with bleeding: Secondary | ICD-10-CM | POA: Diagnosis not present

## 2019-01-18 DIAGNOSIS — R9431 Abnormal electrocardiogram [ECG] [EKG]: Secondary | ICD-10-CM | POA: Diagnosis not present

## 2019-01-18 DIAGNOSIS — D6489 Other specified anemias: Secondary | ICD-10-CM | POA: Diagnosis not present

## 2019-01-18 DIAGNOSIS — I5042 Chronic combined systolic (congestive) and diastolic (congestive) heart failure: Secondary | ICD-10-CM | POA: Diagnosis not present

## 2019-01-18 DIAGNOSIS — D62 Acute posthemorrhagic anemia: Secondary | ICD-10-CM | POA: Diagnosis not present

## 2019-01-18 DIAGNOSIS — F419 Anxiety disorder, unspecified: Secondary | ICD-10-CM | POA: Diagnosis present

## 2019-01-18 DIAGNOSIS — Z20822 Contact with and (suspected) exposure to covid-19: Secondary | ICD-10-CM | POA: Diagnosis not present

## 2019-01-18 DIAGNOSIS — Y92009 Unspecified place in unspecified non-institutional (private) residence as the place of occurrence of the external cause: Secondary | ICD-10-CM

## 2019-01-18 DIAGNOSIS — S52602A Unspecified fracture of lower end of left ulna, initial encounter for closed fracture: Secondary | ICD-10-CM | POA: Diagnosis not present

## 2019-01-18 DIAGNOSIS — I083 Combined rheumatic disorders of mitral, aortic and tricuspid valves: Secondary | ICD-10-CM | POA: Diagnosis present

## 2019-01-18 DIAGNOSIS — I2510 Atherosclerotic heart disease of native coronary artery without angina pectoris: Secondary | ICD-10-CM | POA: Diagnosis present

## 2019-01-18 DIAGNOSIS — N183 Chronic kidney disease, stage 3 unspecified: Secondary | ICD-10-CM | POA: Diagnosis present

## 2019-01-18 DIAGNOSIS — T45525A Adverse effect of antithrombotic drugs, initial encounter: Secondary | ICD-10-CM | POA: Diagnosis present

## 2019-01-18 DIAGNOSIS — S52692A Other fracture of lower end of left ulna, initial encounter for closed fracture: Secondary | ICD-10-CM | POA: Diagnosis not present

## 2019-01-18 DIAGNOSIS — I130 Hypertensive heart and chronic kidney disease with heart failure and stage 1 through stage 4 chronic kidney disease, or unspecified chronic kidney disease: Secondary | ICD-10-CM | POA: Diagnosis present

## 2019-01-18 DIAGNOSIS — M542 Cervicalgia: Secondary | ICD-10-CM | POA: Diagnosis not present

## 2019-01-18 DIAGNOSIS — E785 Hyperlipidemia, unspecified: Secondary | ICD-10-CM | POA: Diagnosis present

## 2019-01-18 DIAGNOSIS — K250 Acute gastric ulcer with hemorrhage: Secondary | ICD-10-CM | POA: Diagnosis not present

## 2019-01-18 DIAGNOSIS — Z8249 Family history of ischemic heart disease and other diseases of the circulatory system: Secondary | ICD-10-CM

## 2019-01-18 DIAGNOSIS — K2950 Unspecified chronic gastritis without bleeding: Secondary | ICD-10-CM | POA: Diagnosis not present

## 2019-01-18 DIAGNOSIS — I5020 Unspecified systolic (congestive) heart failure: Secondary | ICD-10-CM | POA: Diagnosis not present

## 2019-01-18 DIAGNOSIS — S52502A Unspecified fracture of the lower end of left radius, initial encounter for closed fracture: Secondary | ICD-10-CM | POA: Diagnosis not present

## 2019-01-18 DIAGNOSIS — I248 Other forms of acute ischemic heart disease: Secondary | ICD-10-CM | POA: Diagnosis not present

## 2019-01-18 DIAGNOSIS — Z951 Presence of aortocoronary bypass graft: Secondary | ICD-10-CM | POA: Diagnosis not present

## 2019-01-18 DIAGNOSIS — K922 Gastrointestinal hemorrhage, unspecified: Secondary | ICD-10-CM | POA: Diagnosis not present

## 2019-01-18 DIAGNOSIS — I10 Essential (primary) hypertension: Secondary | ICD-10-CM | POA: Diagnosis not present

## 2019-01-18 DIAGNOSIS — S199XXA Unspecified injury of neck, initial encounter: Secondary | ICD-10-CM | POA: Diagnosis not present

## 2019-01-18 DIAGNOSIS — Z791 Long term (current) use of non-steroidal anti-inflammatories (NSAID): Secondary | ICD-10-CM

## 2019-01-18 DIAGNOSIS — R195 Other fecal abnormalities: Secondary | ICD-10-CM | POA: Diagnosis not present

## 2019-01-18 DIAGNOSIS — I255 Ischemic cardiomyopathy: Secondary | ICD-10-CM | POA: Diagnosis not present

## 2019-01-18 DIAGNOSIS — Z7902 Long term (current) use of antithrombotics/antiplatelets: Secondary | ICD-10-CM

## 2019-01-18 DIAGNOSIS — R778 Other specified abnormalities of plasma proteins: Secondary | ICD-10-CM | POA: Diagnosis not present

## 2019-01-18 DIAGNOSIS — Z7982 Long term (current) use of aspirin: Secondary | ICD-10-CM

## 2019-01-18 DIAGNOSIS — W1830XA Fall on same level, unspecified, initial encounter: Secondary | ICD-10-CM | POA: Diagnosis present

## 2019-01-18 DIAGNOSIS — K921 Melena: Secondary | ICD-10-CM | POA: Diagnosis not present

## 2019-01-18 LAB — TROPONIN I (HIGH SENSITIVITY)
Troponin I (High Sensitivity): 192 ng/L (ref <18)
Troponin I (High Sensitivity): 195 ng/L (ref <18)

## 2019-01-18 LAB — CBC
HCT: 27.8 % — ABNORMAL LOW (ref 36.0–46.0)
Hemoglobin: 9.2 g/dL — ABNORMAL LOW (ref 12.0–15.0)
MCH: 33.1 pg (ref 26.0–34.0)
MCHC: 33.1 g/dL (ref 30.0–36.0)
MCV: 100 fL (ref 80.0–100.0)
Platelets: 238 10*3/uL (ref 150–400)
RBC: 2.78 MIL/uL — ABNORMAL LOW (ref 3.87–5.11)
RDW: 14.4 % (ref 11.5–15.5)
WBC: 12.4 10*3/uL — ABNORMAL HIGH (ref 4.0–10.5)
nRBC: 0 % (ref 0.0–0.2)

## 2019-01-18 LAB — HEPATIC FUNCTION PANEL
ALT: 15 U/L (ref 0–44)
AST: 20 U/L (ref 15–41)
Albumin: 3.2 g/dL — ABNORMAL LOW (ref 3.5–5.0)
Alkaline Phosphatase: 46 U/L (ref 38–126)
Bilirubin, Direct: 0.1 mg/dL (ref 0.0–0.2)
Indirect Bilirubin: 0.5 mg/dL (ref 0.3–0.9)
Total Bilirubin: 0.6 mg/dL (ref 0.3–1.2)
Total Protein: 6 g/dL — ABNORMAL LOW (ref 6.5–8.1)

## 2019-01-18 LAB — BASIC METABOLIC PANEL
Anion gap: 10 (ref 5–15)
BUN: 64 mg/dL — ABNORMAL HIGH (ref 8–23)
CO2: 23 mmol/L (ref 22–32)
Calcium: 8.6 mg/dL — ABNORMAL LOW (ref 8.9–10.3)
Chloride: 102 mmol/L (ref 98–111)
Creatinine, Ser: 1.09 mg/dL — ABNORMAL HIGH (ref 0.44–1.00)
GFR calc Af Amer: 58 mL/min — ABNORMAL LOW (ref >60)
GFR calc non Af Amer: 50 mL/min — ABNORMAL LOW (ref >60)
Glucose, Bld: 117 mg/dL — ABNORMAL HIGH (ref 70–99)
Potassium: 3.9 mmol/L (ref 3.5–5.1)
Sodium: 135 mmol/L (ref 135–145)

## 2019-01-18 LAB — POC OCCULT BLOOD, ED: Fecal Occult Bld: POSITIVE — AB

## 2019-01-18 LAB — BRAIN NATRIURETIC PEPTIDE: B Natriuretic Peptide: 496 pg/mL — ABNORMAL HIGH (ref 0.0–100.0)

## 2019-01-18 LAB — CBG MONITORING, ED: Glucose-Capillary: 153 mg/dL — ABNORMAL HIGH (ref 70–99)

## 2019-01-18 MED ORDER — FENTANYL CITRATE (PF) 100 MCG/2ML IJ SOLN
50.0000 ug | Freq: Once | INTRAMUSCULAR | Status: AC
  Administered 2019-01-18: 50 ug via INTRAVENOUS
  Filled 2019-01-18: qty 2

## 2019-01-18 MED ORDER — TETANUS-DIPHTH-ACELL PERTUSSIS 5-2.5-18.5 LF-MCG/0.5 IM SUSP
0.5000 mL | Freq: Once | INTRAMUSCULAR | Status: AC
  Administered 2019-01-18: 0.5 mL via INTRAMUSCULAR
  Filled 2019-01-18: qty 0.5

## 2019-01-18 MED ORDER — PROPOFOL 10 MG/ML IV BOLUS
0.5000 mg/kg | Freq: Once | INTRAVENOUS | Status: AC
  Administered 2019-01-18: 19.5 mg via INTRAVENOUS
  Filled 2019-01-18: qty 20

## 2019-01-18 MED ORDER — PANTOPRAZOLE SODIUM 40 MG IV SOLR
40.0000 mg | Freq: Once | INTRAVENOUS | Status: AC
  Administered 2019-01-18: 40 mg via INTRAVENOUS
  Filled 2019-01-18: qty 40

## 2019-01-18 NOTE — ED Provider Notes (Addendum)
[facility] EMERGENCY DEPARTMENT Provider Note   CSN: [id number] Arrival date & time: 01/18/19  1228     History Chief Complaint  Patient presents with  . Weakness    Susan [name] is a 74 y.o. female with PMHx CHF with EF 30%, CAD, CKD stage III, COPD, HTN, who presents to the ED today with complaint of generalized weakness x 2 days.  History provided by husband as patient has mild dementia.  He reports that yesterday she became very weak while walking which was not her baseline.  He took her to [facility] but due to increased weight times they left prior to being seen.  Husband reports that this morning patient got out of bed and was able to ambulate on her own to the bathroom while he went and got the newspaper.  He states that when he came back patient was lying on the floor with an obvious deformity to her left wrist.  Husband does not think patient hit her head nor does patient think so although she is complaining of some neck pain.  Husband reports that for the past day she has also not wanting to eat very much as every time she eats something she feels nauseated.  No vomiting.  No diarrhea.  Reports she has a chronic cough as she has COPD and is a current every day smoker although he states it may be mildly increased for the past 2 days.  Husband reports that they did see family over the holidays but are not concerned about Covid.  Denies fever, chills, headache, vision changes, speech difficulties, unilateral weakness or numbness, abdominal pain, chest pain, worsening shortness of breath, urinary symptoms, any other associated symptoms.  The history is provided by the patient and the spouse. The history is limited by a developmental delay.       Past Medical History:  Diagnosis Date  . Acute on chronic systolic and diastolic heart failure, NYHA class 1 (HCC) 11/21/2018  . Anxiety   . CAD (coronary artery disease)    a. s/p CABG x5 in 2000 b. cath in 02/2017 showing 2/5 patent  grafts with patent LIMA-LAD, patent SVG-distal RCA, occluded SVG-D1 and occluded seq-SVG-OM1-OM2 with medical management recommended and possible PCI of distal RCA branches in the future if recurrent anginal symptoms  . CHF (congestive heart failure) (HCC)    a. EF 20-25% by echo in 12/2016 b. EF 45-50% by echo in 05/2017  . Chronic bronchitis (HCC)   . CKD (chronic kidney disease) stage 3, GFR 30-59 ml/min 11/21/2018  . COPD (chronic obstructive pulmonary disease) (HCC)   . Dizziness   . Esophageal reflux   . Essential hypertension   . Fatigue   . HLD (hyperlipidemia) 11/21/2018  . HTN (hypertension) 11/21/2018  . Hypercholesteremia   . Hyperkalemia   . Ovarian failure   . SOB (shortness of breath)     Patient Active Problem List   Diagnosis Date Noted  . Acute on chronic systolic and diastolic heart failure, NYHA class 1 (HCC) 11/21/2018  . CKD (chronic kidney disease) stage 3, GFR 30-59 ml/min 11/21/2018  . CAD in native artery 11/21/2018  . HTN (hypertension) 11/21/2018  . HLD (hyperlipidemia) 11/21/2018  . NSTEMI (non-ST elevated myocardial infarction) (HCC) 11/19/2018  . Hx of CABG 02/18/2017  . Diastolic dysfunction 02/18/2017  . Pulmonary hypertension (HCC) 02/18/2017  . Smoker 02/18/2017  . Renal insufficiency 02/18/2017  . Dyslipidemia 02/18/2017  . Ischemic cardiomyopathy     Past Surgical History:  Procedure Laterality Date  . ABDOMINAL HYSTERECTOMY    . APPENDECTOMY    . BREAST BIOPSY Right   . CARDIAC CATHETERIZATION  2000  . CORONARY ARTERY BYPASS GRAFT  2000   CABG X5  . LEFT HEART CATH AND CORS/GRAFTS ANGIOGRAPHY N/A 02/17/2017   Procedure: LEFT HEART CATH AND CORS/GRAFTS ANGIOGRAPHY;  Surgeon: Kathleene Hazel, MD;  Location: [facility] INVASIVE CV LAB;  Service: Cardiovascular;  Laterality: N/A;  . LEFT HEART CATH AND CORS/GRAFTS ANGIOGRAPHY N/A 11/20/2018   Procedure: LEFT HEART CATH AND CORS/GRAFTS ANGIOGRAPHY;  Surgeon: Yvonne Kendall, MD;  Location:  [facility] INVASIVE CV LAB;  Service: Cardiovascular;  Laterality: N/A;     OB History   No obstetric history on file.     Family History  Problem Relation Age of Onset  . Heart attack Mother   . Hypertension Father     Social History   Tobacco Use  . Smoking status: Current Every Day Smoker    Packs/day: 1.00    Years: 50.00    Pack years: 50.00    Types: Cigarettes    Last attempt to quit: 02/22/2017    Years since quitting: 1.9  . Smokeless tobacco: Never Used  Substance Use Topics  . Alcohol use: No  . Drug use: No    Home Medications Prior to Admission medications   Medication Sig Start Date End Date Taking? Authorizing Provider  aspirin EC 81 MG tablet Take 81 mg by mouth daily.   Yes [provider]  clopidogrel (PLAVIX) 75 MG tablet Take 1 tablet (75 mg total) by mouth daily with breakfast. 11/22/18  Yes Leone Brand, NP  furosemide (LASIX) 40 MG tablet Take 1 tablet (40 mg total) by mouth daily. (may take extra as needed for weight gain of 3 pounds in 1 day or 5 pounds in 1 week) 12/11/18  Yes Laqueta Linden, MD  hydrALAZINE (APRESOLINE) 25 MG tablet Take 1 tablet (25 mg total) by mouth 3 (three) times daily. 12/11/18  Yes Laqueta Linden, MD  isosorbide mononitrate (IMDUR) 30 MG 24 hr tablet Take 0.5 tablets (15 mg total) by mouth daily. 11/22/18  Yes Leone Brand, NP  metoprolol succinate (TOPROL-XL) 25 MG 24 hr tablet Take 0.5 tablets (12.5 mg total) by mouth daily. 11/22/18  Yes Leone Brand, NP  Multiple Vitamin (MULTIVITAMIN) tablet Take 1 tablet by mouth daily.   Yes [provider]  nitroGLYCERIN (NITROSTAT) 0.4 MG SL tablet Place 1 tablet (0.4 mg total) under the tongue every 5 (five) minutes x 3 doses as needed for chest pain. 02/18/17  Yes Kilroy, Luke K, PA-C  potassium chloride SA (KLOR-CON M20) 20 MEQ tablet Take 1 tablet (20 mEq total) by mouth daily. (may take one extra tab when taking extra Lasix) 12/11/18  Yes Laqueta Linden, MD  rosuvastatin (CRESTOR) 5 MG tablet Take 5 mg by mouth daily. 12/22/18  Yes [provider]  vitamin B-12 (CYANOCOBALAMIN) 250 MCG tablet Take 250 mcg by mouth daily.   Yes [provider]    Allergies    Patient has no known allergies.  Review of Systems   Review of Systems  Constitutional: Positive for fatigue. Negative for chills and fever.  Eyes: Negative for visual disturbance.  Respiratory: Positive for cough. Negative for shortness of breath (at baseline).   Cardiovascular: Negative for chest pain.  Gastrointestinal: Positive for nausea. Negative for abdominal pain, constipation, diarrhea and vomiting.  Genitourinary: Negative for difficulty urinating and  dysuria.  Musculoskeletal: Positive for neck pain.  Neurological: Positive for weakness (generalized). Negative for dizziness, speech difficulty, light-headedness, numbness and headaches.  All other systems reviewed and are negative.   Physical Exam Updated Vital Signs BP (!) 143/81 (BP Location: Right Arm)   Pulse (!) 116   Temp 98.1 F (36.7 C) (Oral)   Resp 18   Ht 5' (1.524 m)   Wt 39 kg   SpO2 97%   BMI 16.80 kg/m   Physical Exam Vitals and nursing note reviewed.  Constitutional:      Appearance: She is not ill-appearing.     Comments: Frail elderly female  HENT:     Head: Normocephalic and atraumatic.     Comments: No raccoon's sign or battle's sign. Negative hemotympanum bilaterally. No scalp deformities appreciated.     Right Ear: Tympanic membrane normal.     Left Ear: Tympanic membrane normal.  Eyes:     Extraocular Movements: Extraocular movements intact.     Conjunctiva/sclera: Conjunctivae normal.     Pupils: Pupils are equal, round, and reactive to light.  Neck:     Comments: Midline C spine tenderness to palpation Cardiovascular:     Rate and Rhythm: Normal rate and regular rhythm.     Pulses: Normal pulses.  Pulmonary:     Effort: Pulmonary effort is normal.       Breath sounds: Normal breath sounds. No wheezing, rhonchi or rales.  Abdominal:     Palpations: Abdomen is soft.     Tenderness: There is no abdominal tenderness. There is no guarding or rebound.  Genitourinary:    Rectum: Guaiac result positive.     Comments: Chaperone present Earlie Lou, RN. 1 small nonthrombosed external hemorrhoid appreciated. Good rectal tone. Black stool on gloved finger.  Musculoskeletal:     Cervical back: Neck supple. Tenderness present.     Comments: Obvious deformity noted to left wrist with dorsal angulation; ecchymosis and TTP. Mild tenderness to the dorsum of the left hand. Able to wiggle fingers without difficulty. Cap refill < 2 seconds. NVI. 2+ radial pulse. No tenderness to proximal left forearm, elbow, humerus, or shoulder.   No tenderness to all other joints. No pain illicited with leg log roll.   Skin:    General: Skin is warm and dry.  Neurological:     Mental Status: She is alert.     Cranial Nerves: No cranial nerve deficit.     Motor: No weakness.     Comments: Alert and oriented x 3.      ED Results / Procedures / Treatments   Labs (all labs ordered are listed, but only abnormal results are displayed) Labs Reviewed  BASIC METABOLIC PANEL - Abnormal; Notable for the following components:      Result Value   Glucose, Bld 117 (*)    BUN 64 (*)    Creatinine, Ser 1.09 (*)    Calcium 8.6 (*)    GFR calc non Af Amer 50 (*)    GFR calc Af Amer 58 (*)    All other components within normal limits  CBC - Abnormal; Notable for the following components:   WBC 12.4 (*)    RBC 2.78 (*)    Hemoglobin 9.2 (*)    HCT 27.8 (*)    All other components within normal limits  HEPATIC FUNCTION PANEL - Abnormal; Notable for the following components:   Total Protein 6.0 (*)    Albumin 3.2 (*)  All other components within normal limits  BRAIN NATRIURETIC PEPTIDE - Abnormal; Notable for the following components:   B Natriuretic Peptide 496.0  (*)    All other components within normal limits  CBG MONITORING, ED - Abnormal; Notable for the following components:   Glucose-Capillary 153 (*)    All other components within normal limits  POC OCCULT BLOOD, ED - Abnormal; Notable for the following components:   Fecal Occult Bld POSITIVE (*)    All other components within normal limits  TROPONIN I (HIGH SENSITIVITY) - Abnormal; Notable for the following components:   Troponin I (High Sensitivity) 192 (*)    All other components within normal limits  TROPONIN I (HIGH SENSITIVITY) - Abnormal; Notable for the following components:   Troponin I (High Sensitivity) 195 (*)    All other components within normal limits  SARS CORONAVIRUS 2 (TAT 6-24 HRS)  URINALYSIS, ROUTINE W REFLEX MICROSCOPIC  TYPE AND SCREEN    EKG EKG Interpretation  Date/Time:  Thursday January 18 2019 13:06:40 EST Ventricular Rate:  114 PR Interval:  114 QRS Duration: 90 QT Interval:  356 QTC Calculation: 490 R Axis:   48 Text Interpretation: Sinus tachycardia Possible Left atrial enlargement Left ventricular hypertrophy with repolarization abnormality Abnormal ECG increased ischemic changes compared with prior 11/20 Confirmed by Meridee Score [phone]) on 01/18/2019 1:10:55 PM   Radiology DG Chest 2 View  Result Date: 01/18/2019 CLINICAL DATA:  74 year old female with history of cough. EXAM: CHEST - 2 VIEW COMPARISON:  Chest x-ray 01/17/2019. FINDINGS: Lung volumes are increased with emphysematous changes. No acute consolidative airspace disease. No pleural effusions. No pneumothorax. No suspicious appearing pulmonary nodules or masses. No evidence of pulmonary edema. Heart size is mildly enlarged. Upper mediastinal contours are within normal limits. Aortic atherosclerosis. Status post median sternotomy for CABG including LIMA. Surgical clips projecting over the right hemithorax, presumably in the overlying breast. IMPRESSION: 1. No radiographic evidence of acute  cardiopulmonary disease. 2. Emphysema. 3. Mild cardiomegaly. 4. Aortic atherosclerosis. Electronically Signed   By: Trudie Reed M.D.   On: 01/18/2019 16:10   DG Wrist 2 Views Left  Result Date: 01/18/2019 CLINICAL DATA:  Status post reduction EXAM: LEFT WRIST - 2 VIEW COMPARISON:  01/18/2019 FINDINGS: Casting material is now seen. Previously seen distal radial and ulnar fractures are again identified. The posterior displacement has been significantly reduced although mild posterior angulation remains. No other focal abnormality is seen IMPRESSION: Reduction and casting of distal radial and ulnar fractures. Electronically Signed   By: Alcide Clever M.D.   On: 01/18/2019 21:39   DG Wrist Complete Left  Result Date: 01/18/2019 CLINICAL DATA:  Fall EXAM: LEFT HAND - COMPLETE 3+ VIEW; LEFT WRIST - COMPLETE 3+ VIEW COMPARISON:  None. FINDINGS: There are fractures of the distal left radius and ulna with dorsal angulation and mild displacement. There are marked changes of osteoarthritis at the first carpometacarpal joint. Less pronounced changes of osteoarthritis are present at the triscaphe and distal interphalangeal joints. IMPRESSION: Acute fractures of the distal left radius and ulna with dorsal angulation and mild displacement. Electronically Signed   By: Guadlupe Spanish M.D.   On: 01/18/2019 13:35   CT Head Wo Contrast  Result Date: 01/18/2019 CLINICAL DATA:  Recent fall with headaches and neck pain, initial encounter EXAM: CT HEAD WITHOUT CONTRAST CT CERVICAL SPINE WITHOUT CONTRAST TECHNIQUE: Multidetector CT imaging of the head and cervical spine was performed following the standard protocol without intravenous contrast. Multiplanar CT image reconstructions of  the cervical spine were also generated. COMPARISON:  None. FINDINGS: CT HEAD FINDINGS Brain: No evidence of acute infarction, hemorrhage, hydrocephalus, extra-axial collection or mass lesion/mass effect. Mild chronic white matter ischemic changes  are seen. 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 Vascular: No hyperdense vessel or unexpected calcification. Skull: Normal. Negative for fracture or focal lesion. Sinuses/Orbits: No acute finding. Other: None. CT CERVICAL SPINE FINDINGS Alignment: Within normal limits. Skull base and vertebrae: 7 cervical segments are well visualized. Vertebral body height is well maintained. Multilevel facet hypertrophic changes are seen. No acute fracture or acute facet abnormality is noted. Soft tissues and spinal canal: Surrounding soft tissue structures are within normal limits. Upper chest: Visualized lung apices are within normal limits. Other: None IMPRESSION: CT of the head: Chronic ischemic changes without acute abnormality. CT of the cervical spine: Multilevel degenerative change without acute abnormality. Electronically Signed   By: Alcide Clever M.D.   On: 01/18/2019 17:06   CT Cervical Spine Wo Contrast  Result Date: 01/18/2019 CLINICAL DATA:  Recent fall with headaches and neck pain, initial encounter EXAM: CT HEAD WITHOUT CONTRAST CT CERVICAL SPINE WITHOUT CONTRAST TECHNIQUE: Multidetector CT imaging of the head and cervical spine was performed following the standard protocol without intravenous contrast. Multiplanar CT image reconstructions of the cervical spine were also generated. COMPARISON:  None. FINDINGS: CT HEAD FINDINGS Brain: No evidence of acute infarction, hemorrhage, hydrocephalus, extra-axial collection or mass lesion/mass effect. Mild chronic white matter ischemic changes are seen. 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 Vascular: No hyperdense vessel or unexpected calcification. Skull: Normal. Negative for fracture or focal lesion. Sinuses/Orbits: No acute finding. Other: None. CT CERVICAL SPINE FINDINGS Alignment: Within normal limits. Skull base and vertebrae: 7 cervical segments are well visualized. Vertebral body height is well maintained. Multilevel facet hypertrophic changes are seen.  No acute fracture or acute facet abnormality is noted. Soft tissues and spinal canal: Surrounding soft tissue structures are within normal limits. Upper chest: Visualized lung apices are within normal limits. Other: None IMPRESSION: CT of the head: Chronic ischemic changes without acute abnormality. CT of the cervical spine: Multilevel degenerative change without acute abnormality. Electronically Signed   By: Alcide Clever M.D.   On: 01/18/2019 17:06   DG Hand Complete Left  Result Date: 01/18/2019 CLINICAL DATA:  Fall EXAM: LEFT HAND - COMPLETE 3+ VIEW; LEFT WRIST - COMPLETE 3+ VIEW COMPARISON:  None. FINDINGS: There are fractures of the distal left radius and ulna with dorsal angulation and mild displacement. There are marked changes of osteoarthritis at the first carpometacarpal joint. Less pronounced changes of osteoarthritis are present at the triscaphe and distal interphalangeal joints. IMPRESSION: Acute fractures of the distal left radius and ulna with dorsal angulation and mild displacement. Electronically Signed   By: Guadlupe Spanish M.D.   On: 01/18/2019 13:35    Procedures .Critical Care Performed by: Tanda Rockers, PA-C Authorized by: Tanda Rockers, PA-C   Critical care provider statement:    Critical care time (minutes):  45   Critical care was necessary to treat or prevent imminent or life-threatening deterioration of the following conditions: GI bleed.   Critical care was time spent personally by me on the following activities:  Discussions with consultants, evaluation of patient's response to treatment, examination of patient, ordering and performing treatments and interventions, ordering and review of laboratory studies, ordering and review of radiographic studies, pulse oximetry, re-evaluation of patient's condition, obtaining history from patient or surrogate and review of old charts   (  including critical care time)  Medications Ordered in ED Medications  fentaNYL (SUBLIMAZE)  injection 50 mcg (50 mcg Intravenous Given 01/18/19 1645)  propofol (DIPRIVAN) 10 mg/mL bolus/IV push 19.5 mg (19.5 mg Intravenous Given 01/18/19 2117)  pantoprazole (PROTONIX) injection 40 mg (40 mg Intravenous Given 01/18/19 2140)  Tdap (BOOSTRIX) injection 0.5 mL (0.5 mLs Intramuscular Given 01/18/19 2140)    ED Course  I have reviewed the triage vital signs and the nursing notes.  Pertinent labs & imaging results that were available during my care of the patient were reviewed by me and considered in my medical decision making (see chart for details).  74 year old female presents today with generalized weakness/fatigue for the past 2 days with fall that occurred earlier today with obvious deformity to left wrist.  Husband provides much of the information as patient has some dementia although she is currently alert and oriented to person place and time.  Reports that she has just been more fatigued as of late and every time she tries to eat something she feels nauseated although she has not had any emesis.  He is denying any diarrhea or constipation.  Does endorse patient has had an increased cough as of late but is not concerned about Covid although they have seen family during the holidays.  Her only complaint currently is pain to her left wrist, again obvious deformity.  On arrival she is afebrile though she is tachycardic in the 110s.  Suspect this may be due more to pain than anything else.  Will give pain medication and reevaluate.  X-ray was obtained in triage with displacement, angulation, distal radius and ulna fracture.  She is neurovascularly intact.  Will consult orthopedics for further evaluation.  She is unsure if she hit her head, will obtain CTs of the head and C-spine.  Chest x-ray obtained as well given complaint of cough.  Will swab for Covid.  Will check screening labs at this time.  Will attempt reduction in the ED with attending physician Dr. Lacinda Axon per orthopedics Dr. Ruthe Mannan  recommendations, if not successful he will come try to reduce this himself although he does not feel she needs emergent surgery given it is not an open fracture.   Hemolobin 9.2 today.  1 month ago it was at 14.7.  Exam performed with obvious black stool on gloved finger which is guaiac positive.  Husband reports he helped her go to the restroom today and noticed the dark stools.  Patient is unsure how long it has been present.  She was recently started on Plavix approximately 2 months ago after NSTEMI.  He has not had any complaints of abdominal pain and has no focal tenderness on exam today.  Suspect her GI bleed is from the recent Plavix.  Will consult GI at this time.  Hemoglobin  Date Value Ref Range Status  01/18/2019 9.2 (L) 12.0 - 15.0 g/dL Final  11/21/2018 14.7 12.0 - 15.0 g/dL Final  11/20/2018 14.5 12.0 - 15.0 g/dL Final  11/19/2018 14.2 12.0 - 15.0 g/dL Final   Troponin elevated at 192 although compared to patient's discharge for NSTEMI 2 months ago it is significantly improved.  Will trend at this time.  Patient is not complaining of any active chest pain.  EKG essentially unchanged.  May need to consider consulting cardiology after delta troponin if significant abnormality.   Have discussed case with cardiology - see ED course.   Conscious sedation/reduction performed in the ED with attending physician Dr. Lacinda Axon. During procedure  pt sustained a skin tear given amount of pressure while attempting to reduce wrist. Will update tetanus at this time. Post op xrays obtained with good reduction although mild displacement. Pt will follow up with orthopedist outpatient. If she is in the hospital for a prolonged period of time may benefit seeing pt during her stay. Will consult hospitalist at this time.   Dr. Sharl Ma to admit.    Clinical Course as of Jan 18 2152  Thu Jan 18, 2019  1553 Hemoglobin(!): 9.2 [MV]  1621 Troponin I (High Sensitivity)(!!): 192 [MV]  1645 Discussed case with Dr.  Romeo Apple who recommends attempting reduction in the ED and if unsuccessful he will come attempt. Will have pt follow up in the office.    [MV]  1714 Fecal Occult Blood, POC(!): POSITIVE [MV]  1744 Discussed case with GI Dr. Jena Gauss who recommends making pt NPO past midnight; he will come see her in the morning for further evaluation. Will start her on a PPI as well.    [MV]  2138 Discussed case with Dr. Sharl Ma with [facility] who agrees to accept patient for admission   [MV]  2148 Discussed case with Dr. Truman Hayward cardiology fellow who is not concerned for ACS at this time; suspect pt's troponin levels are likely either from demand ischemia from her anemia and active GI bleed vs questionable baseline with known cardiomyopathy and EF 30%   [MV]    Clinical Course User Index [MV] Tanda Rockers, PA-C   MDM Rules/Calculators/A&P                      Final Clinical Impression(s) / ED Diagnoses Final diagnoses:  Symptomatic anemia  Lower GI bleed  Closed Colles' fracture of left radius, initial encounter    Rx / DC Orders ED Discharge Orders    None       Milinda Antis 01/18/19 2154    Donnetta Hutching, MD 01/19/19 2233    Tanda Rockers, PA-C 01/20/19 3329    Donnetta Hutching, MD 01/23/19 [phone]

## 2019-01-18 NOTE — ED Triage Notes (Addendum)
Pt spouse went to [facility] yesterday for weakness but reports had to leave prior to seeing a provider. Pt spouse reports pt has had generalized weakness for last several days. Pt fell this am and reports left hand/wrist pain. Pt is on blood thinners.pt has history of dementia and does not remember chain of events related to fall. Pt husband reports went outside this morning and came back in the house and noticed pt on the floor. Pt usually ambulates and provides self care but reports decline in condition since yesterday.   Pt spouse reports pt is on blood thinners. Moderate bruise noted to left hand/wrist. Limited rom.

## 2019-01-18 NOTE — ED Notes (Addendum)
Date and time results received: 01/18/19 4:43 PM  Test: troponin Critical Value: 192  Name of Provider Notified: Tanda Rockers  Orders Received? Or Actions Taken?:see orders

## 2019-01-18 NOTE — ED Notes (Signed)
Pt placed on purewick and given water per request

## 2019-01-18 NOTE — ED Notes (Signed)
Three gold wedding bands removed in triage and given to pt spouse.

## 2019-01-18 NOTE — H&P (Addendum)
TRH H&P    Patient Demographics:    Susan [name], is a 74 y.o. female  MRN: [id number]  DOB - June 28, 1945  Admit Date - 01/18/2019  Referring MD/NP/PA: Tanda Rockers  Outpatient Primary MD for the patient is [name], MD  Patient coming from: Home  Chief complaint-generalized weakness   HPI:    Susan [name]  is a 74 y.o. female, with history of CHF EF 30%, CAD, CKD stage III, COPD, hypertension who came to ED with complaints of generalized weakness for past 2 days.  History obtained from the ED notes as per husband patient has mild dementia she became very weak while walking which was not her baseline.  He took her to [facility] but due to increased wait time the left prior to being seen.  Husband reported this morning patient got out of bed and was able to ambulate on her own to the bathroom while he went and got the newspaper.  He stated that when he came back patient was lying on the floor with obvious deformity to her left wrist.  Patient does not think that she hit her head or passed out. Patient denies any vomiting.  No diarrhea. She does have chronic cough. Denies fever chills.  Vision changes.  Speech difficulties.  In the ED x-rays obtained in the triage with displacement, angulation, distal radius and ulna fracture.  Fracture reduced in the ED.  Postop x-rays obtained with good reduction although has mild displacement.  Patient will follow up with orthopedic surgeon as outpatient. Hemoglobin dropped from 14.7 in November 20 to 9.2 today.  Stool guaiac was positive. Patient has been complaining of black-colored stool for past few days.  She was started on aspirin and Plavix after she had a non-STEMI. Patient also has mild elevation of troponin. She denies chest pain or shortness of breath.    Review of systems:    In addition to the HPI above,    All other systems reviewed and are  negative.    Past History of the following :    Past Medical History:  Diagnosis Date  . Acute on chronic systolic and diastolic heart failure, NYHA class 1 (HCC) 11/21/2018  . Anxiety   . CAD (coronary artery disease)    a. s/p CABG x5 in 2000 b. cath in 02/2017 showing 2/5 patent grafts with patent LIMA-LAD, patent SVG-distal RCA, occluded SVG-D1 and occluded seq-SVG-OM1-OM2 with medical management recommended and possible PCI of distal RCA branches in the future if recurrent anginal symptoms  . CHF (congestive heart failure) (HCC)    a. EF 20-25% by echo in 12/2016 b. EF 45-50% by echo in 05/2017  . Chronic bronchitis (HCC)   . CKD (chronic kidney disease) stage 3, GFR 30-59 ml/min 11/21/2018  . COPD (chronic obstructive pulmonary disease) (HCC)   . Dizziness   . Esophageal reflux   . Essential hypertension   . Fatigue   . HLD (hyperlipidemia) 11/21/2018  . HTN (hypertension) 11/21/2018  . Hypercholesteremia   . Hyperkalemia   . Ovarian failure   .  SOB (shortness of breath)       Past Surgical History:  Procedure Laterality Date  . ABDOMINAL HYSTERECTOMY    . APPENDECTOMY    . BREAST BIOPSY Right   . CARDIAC CATHETERIZATION  2000  . CORONARY ARTERY BYPASS GRAFT  2000   CABG X5  . LEFT HEART CATH AND CORS/GRAFTS ANGIOGRAPHY N/A 02/17/2017   Procedure: LEFT HEART CATH AND CORS/GRAFTS ANGIOGRAPHY;  Surgeon: Burnell Blanks, MD;  Location: [facility];  Service: Cardiovascular;  Laterality: N/A;  . LEFT HEART CATH AND CORS/GRAFTS ANGIOGRAPHY N/A 11/20/2018   Procedure: LEFT HEART CATH AND CORS/GRAFTS ANGIOGRAPHY;  Surgeon: Nelva Bush, MD;  Location: [facility];  Service: Cardiovascular;  Laterality: N/A;      Social History:      Social History   Tobacco Use  . Smoking status: Current Every Day Smoker    Packs/day: 1.00    Years: 50.00    Pack years: 50.00    Types: Cigarettes    Last attempt to quit: 02/22/2017    Years since quitting:  1.9  . Smokeless tobacco: Never Used  Substance Use Topics  . Alcohol use: No       Family History :     Family History  Problem Relation Age of Onset  . Heart attack Mother   . Hypertension Father       Home Medications:   Prior to Admission medications   Medication Sig Start Date End Date Taking? Authorizing Provider  aspirin EC 81 MG tablet Take 81 mg by mouth daily.   Yes [provider]  clopidogrel (PLAVIX) 75 MG tablet Take 1 tablet (75 mg total) by mouth daily with breakfast. 11/22/18  Yes Isaiah Serge, NP  furosemide (LASIX) 40 MG tablet Take 1 tablet (40 mg total) by mouth daily. (may take extra as needed for weight gain of 3 pounds in 1 day or 5 pounds in 1 week) 12/11/18  Yes Herminio Commons, MD  hydrALAZINE (APRESOLINE) 25 MG tablet Take 1 tablet (25 mg total) by mouth 3 (three) times daily. 12/11/18  Yes Herminio Commons, MD  isosorbide mononitrate (IMDUR) 30 MG 24 hr tablet Take 0.5 tablets (15 mg total) by mouth daily. 11/22/18  Yes Isaiah Serge, NP  metoprolol succinate (TOPROL-XL) 25 MG 24 hr tablet Take 0.5 tablets (12.5 mg total) by mouth daily. 11/22/18  Yes Isaiah Serge, NP  Multiple Vitamin (MULTIVITAMIN) tablet Take 1 tablet by mouth daily.   Yes [provider]  nitroGLYCERIN (NITROSTAT) 0.4 MG SL tablet Place 1 tablet (0.4 mg total) under the tongue every 5 (five) minutes x 3 doses as needed for chest pain. 02/18/17  Yes Kilroy, Luke K, PA-C  potassium chloride SA (KLOR-CON M20) 20 MEQ tablet Take 1 tablet (20 mEq total) by mouth daily. (may take one extra tab when taking extra Lasix) 12/11/18  Yes Herminio Commons, MD  rosuvastatin (CRESTOR) 5 MG tablet Take 5 mg by mouth daily. 12/22/18  Yes [provider]  vitamin B-12 (CYANOCOBALAMIN) 250 MCG tablet Take 250 mcg by mouth daily.   Yes [provider]     Allergies:    No Known Allergies   Physical Exam:   Vitals  Blood pressure (!) 157/90,  pulse (!) 115, temperature 98.1 F (36.7 C), temperature source Oral, resp. rate (!) 21, height 5' (1.524 m), weight 39 kg, SpO2 100 %.  1.  General: Appears in no acute distress  2. Psychiatric: Alert, oriented x3, intact insight and judgment  3. Neurologic: Cranial nerves II through XII grossly intact,, no focal deficit noted  4. HEENMT:  Atraumatic normocephalic, extraocular muscles are intact  5. Respiratory : Clear to auscultation bilaterally, no wheezing or crackles.  6. Cardiovascular : S1-S2, regular, no murmur auscultated  7. Gastrointestinal:  Abdomen is soft, nontender, no organomegaly    8.Musculoskeletal:  Left upper extremity in splint    Data Review:    CBC Recent Labs  Lab 01/18/19 1450  WBC 12.4*  HGB 9.2*  HCT 27.8*  PLT 238  MCV 100.0  MCH 33.1  MCHC 33.1  RDW 14.4   ------------------------------------------------------------------------------------------------------------------  Results for orders placed or performed during the hospital encounter of 01/18/19 (from the past 48 hour(s))  CBG monitoring, ED     Status: Abnormal   Collection Time: 01/18/19 12:58 PM  Result Value Ref Range   Glucose-Capillary 153 (H) 70 - 99 mg/dL  Basic metabolic panel     Status: Abnormal   Collection Time: 01/18/19  2:50 PM  Result Value Ref Range   Sodium 135 135 - 145 mmol/L   Potassium 3.9 3.5 - 5.1 mmol/L   Chloride 102 98 - 111 mmol/L   CO2 23 22 - 32 mmol/L   Glucose, Bld 117 (H) 70 - 99 mg/dL   BUN 64 (H) 8 - 23 mg/dL   Creatinine, Ser 1.19 (H) 0.44 - 1.00 mg/dL   Calcium 8.6 (L) 8.9 - 10.3 mg/dL   GFR calc non Af Amer 50 (L) >60 mL/min   GFR calc Af Amer 58 (L) >60 mL/min   Anion gap 10 5 - 15    Comment: Performed at [facility], [address] [place], [place] [address]  CBC     Status: Abnormal   Collection Time: 01/18/19  2:50 PM  Result Value Ref Range   WBC 12.4 (H) 4.0 - 10.5 K/uL   RBC 2.78 (L) 3.87 - 5.11 MIL/uL   Hemoglobin  9.2 (L) 12.0 - 15.0 g/dL   HCT 81.4 (L) 48.1 - 85.6 %   MCV 100.0 80.0 - 100.0 fL   MCH 33.1 26.0 - 34.0 pg   MCHC 33.1 30.0 - 36.0 g/dL   RDW 31.4 97.0 - 26.3 %   Platelets 238 150 - 400 K/uL   nRBC 0.0 0.0 - 0.2 %    Comment: Performed at [facility], [address] [place], [place] [address]  Hepatic function panel     Status: Abnormal   Collection Time: 01/18/19  2:50 PM  Result Value Ref Range   Total Protein 6.0 (L) 6.5 - 8.1 g/dL   Albumin 3.2 (L) 3.5 - 5.0 g/dL   AST 20 15 - 41 U/L   ALT 15 0 - 44 U/L   Alkaline Phosphatase 46 38 - 126 U/L   Total Bilirubin 0.6 0.3 - 1.2 mg/dL   Bilirubin, Direct 0.1 0.0 - 0.2 mg/dL   Indirect Bilirubin 0.5 0.3 - 0.9 mg/dL    Comment: Performed at [facility], [address], [place], [place] [address]  Troponin I (High Sensitivity)     Status: Abnormal   Collection Time: 01/18/19  2:50 PM  Result Value Ref Range   Troponin I (High Sensitivity) 192 (HH) <18 ng/L    Comment: CRITICAL RESULT CALLED TO, READ BACK BY AND VERIFIED WITH: OAKLEY,RN AT 1642 ON 1.7.21 BY ISLEY,B (NOTE) Elevated high sensitivity troponin I (hsTnI) values and significant  changes across  serial measurements may suggest ACS but many other  chronic and acute conditions are known to elevate hsTnI results.  Refer to the Links section for chest pain algorithms and additional  guidance. Performed at [facility], [address] [place], [place] [address]   Brain natriuretic peptide     Status: Abnormal   Collection Time: 01/18/19  2:50 PM  Result Value Ref Range   B Natriuretic Peptide 496.0 (H) 0.0 - 100.0 pg/mL    Comment: Performed at [facility], [address] [place], [place] [address]  Type and screen [facility]     Status: None   Collection Time: 01/18/19  4:22 PM  Result Value Ref Range   ABO/RH(D) A NEG    Antibody Screen NEG    Sample Expiration      01/21/2019,2359 Performed at [facility], [address], [place], [place] [address]     Troponin I (High Sensitivity)     Status: Abnormal   Collection Time: 01/18/19  4:22 PM  Result Value Ref Range   Troponin I (High Sensitivity) 195 (HH) <18 ng/L    Comment: CRITICAL VALUE NOTED.  VALUE IS CONSISTENT WITH PREVIOUSLY REPORTED AND CALLED VALUE. (NOTE) Elevated high sensitivity troponin I (hsTnI) values and significant  changes across serial measurements may suggest ACS but many other  chronic and acute conditions are known to elevate hsTnI results.  Refer to the Links section for chest pain algorithms and additional  guidance. Performed at [facility], [address] [place], [place] [address]   POC occult blood, ED     Status: Abnormal   Collection Time: 01/18/19  5:06 PM  Result Value Ref Range   Fecal Occult Bld POSITIVE (A) NEGATIVE    Chemistries  Recent Labs  Lab 01/18/19 1450  NA 135  K 3.9  CL 102  CO2 23  GLUCOSE 117*  BUN 64*  CREATININE 1.09*  CALCIUM 8.6*  AST 20  ALT 15  ALKPHOS 46  BILITOT 0.6   ------------------------------------------------------------------------------------------------------------------  ------------------------------------------------------------------------------------------------------------------ GFR: Estimated Creatinine Clearance: 27.9 mL/min (A) (by C-G formula based on SCr of 1.09 mg/dL (H)). Liver Function Tests: Recent Labs  Lab 01/18/19 1450  AST 20  ALT 15  ALKPHOS 46  BILITOT 0.6  PROT 6.0*  ALBUMIN 3.2*   CBG: Recent Labs  Lab 01/18/19 1258  GLUCAP 153*       Imaging Results:    DG Chest 2 View  Result Date: 01/18/2019 CLINICAL DATA:  74 year old female with history of cough. EXAM: CHEST - 2 VIEW COMPARISON:  Chest x-ray 01/17/2019. FINDINGS: Lung volumes are increased with emphysematous changes. No acute consolidative airspace disease. No pleural effusions. No pneumothorax. No suspicious appearing pulmonary nodules or masses. No evidence of pulmonary edema. Heart size is mildly  enlarged. Upper mediastinal contours are within normal limits. Aortic atherosclerosis. Status post median sternotomy for CABG including LIMA. Surgical clips projecting over the right hemithorax, presumably in the overlying breast. IMPRESSION: 1. No radiographic evidence of acute cardiopulmonary disease. 2. Emphysema. 3. Mild cardiomegaly. 4. Aortic atherosclerosis. Electronically Signed   By: Trudie Reed M.D.   On: 01/18/2019 16:10   DG Wrist 2 Views Left  Result Date: 01/18/2019 CLINICAL DATA:  Status post reduction EXAM: LEFT WRIST - 2 VIEW COMPARISON:  01/18/2019 FINDINGS: Casting material is now seen. Previously seen distal radial and ulnar fractures are again identified. The posterior displacement has been significantly reduced although mild posterior angulation remains. No other focal abnormality is seen IMPRESSION:  Reduction and casting of distal radial and ulnar fractures. Electronically Signed   By: Alcide Clever M.D.   On: 01/18/2019 21:39   DG Wrist Complete Left  Result Date: 01/18/2019 CLINICAL DATA:  Fall EXAM: LEFT HAND - COMPLETE 3+ VIEW; LEFT WRIST - COMPLETE 3+ VIEW COMPARISON:  None. FINDINGS: There are fractures of the distal left radius and ulna with dorsal angulation and mild displacement. There are marked changes of osteoarthritis at the first carpometacarpal joint. Less pronounced changes of osteoarthritis are present at the triscaphe and distal interphalangeal joints. IMPRESSION: Acute fractures of the distal left radius and ulna with dorsal angulation and mild displacement. Electronically Signed   By: Guadlupe Spanish M.D.   On: 01/18/2019 13:35   CT Head Wo Contrast  Result Date: 01/18/2019 CLINICAL DATA:  Recent fall with headaches and neck pain, initial encounter EXAM: CT HEAD WITHOUT CONTRAST CT CERVICAL SPINE WITHOUT CONTRAST TECHNIQUE: Multidetector CT imaging of the head and cervical spine was performed following the standard protocol without intravenous contrast.  Multiplanar CT image reconstructions of the cervical spine were also generated. COMPARISON:  None. FINDINGS: CT HEAD FINDINGS Brain: No evidence of acute infarction, hemorrhage, hydrocephalus, extra-axial collection or mass lesion/mass effect. Mild chronic white matter ischemic changes are seen. 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 Vascular: No hyperdense vessel or unexpected calcification. Skull: Normal. Negative for fracture or focal lesion. Sinuses/Orbits: No acute finding. Other: None. CT CERVICAL SPINE FINDINGS Alignment: Within normal limits. Skull base and vertebrae: 7 cervical segments are well visualized. Vertebral body height is well maintained. Multilevel facet hypertrophic changes are seen. No acute fracture or acute facet abnormality is noted. Soft tissues and spinal canal: Surrounding soft tissue structures are within normal limits. Upper chest: Visualized lung apices are within normal limits. Other: None IMPRESSION: CT of the head: Chronic ischemic changes without acute abnormality. CT of the cervical spine: Multilevel degenerative change without acute abnormality. Electronically Signed   By: Alcide Clever M.D.   On: 01/18/2019 17:06   CT Cervical Spine Wo Contrast  Result Date: 01/18/2019 CLINICAL DATA:  Recent fall with headaches and neck pain, initial encounter EXAM: CT HEAD WITHOUT CONTRAST CT CERVICAL SPINE WITHOUT CONTRAST TECHNIQUE: Multidetector CT imaging of the head and cervical spine was performed following the standard protocol without intravenous contrast. Multiplanar CT image reconstructions of the cervical spine were also generated. COMPARISON:  None. FINDINGS: CT HEAD FINDINGS Brain: No evidence of acute infarction, hemorrhage, hydrocephalus, extra-axial collection or mass lesion/mass effect. Mild chronic white matter ischemic changes are seen. 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 Vascular: No hyperdense vessel or unexpected calcification. Skull: Normal. Negative for  fracture or focal lesion. Sinuses/Orbits: No acute finding. Other: None. CT CERVICAL SPINE FINDINGS Alignment: Within normal limits. Skull base and vertebrae: 7 cervical segments are well visualized. Vertebral body height is well maintained. Multilevel facet hypertrophic changes are seen. No acute fracture or acute facet abnormality is noted. Soft tissues and spinal canal: Surrounding soft tissue structures are within normal limits. Upper chest: Visualized lung apices are within normal limits. Other: None IMPRESSION: CT of the head: Chronic ischemic changes without acute abnormality. CT of the cervical spine: Multilevel degenerative change without acute abnormality. Electronically Signed   By: Alcide Clever M.D.   On: 01/18/2019 17:06   DG Hand Complete Left  Result Date: 01/18/2019 CLINICAL DATA:  Fall EXAM: LEFT HAND - COMPLETE 3+ VIEW; LEFT WRIST - COMPLETE 3+ VIEW COMPARISON:  None. FINDINGS: There are fractures  of the distal left radius and ulna with dorsal angulation and mild displacement. There are marked changes of osteoarthritis at the first carpometacarpal joint. Less pronounced changes of osteoarthritis are present at the triscaphe and distal interphalangeal joints. IMPRESSION: Acute fractures of the distal left radius and ulna with dorsal angulation and mild displacement. Electronically Signed   By: Guadlupe Spanish M.D.   On: 01/18/2019 13:35    My personal review of EKG: Rhythm NSR, no ST changes   Assessment & Plan:    Active Problems:   GI bleed   1. GI bleed-patient presented with hemoglobin of 9.2, last hemoglobin was 14.2 in November 2020.  Stool guaiac is positive.  Will hold aspirin and Plavix.  GI has been consulted, will keep her n.p.o. for possible endoscopy in a.m.  Start Protonix 40 mg IV every 12 hours.  Patient's vitals are stable.  2. Anemia-hemoglobin dropped from 14.2 in November, today it is 9.2.  Vitals are stable.  Will not transfuse PRBC at this time.  Follow CBC in  a.m.  Transfuse for hemoglobin less than 7.0.  3. CAD/s/p CABG x5-patient had cardiac cath in November 2020 which showed overall stable appearance of the coronary arteries with severe three-vessel coronary artery disease including chronic total occlusion of mid LAD, mid circumflex and proximal RCA.  Recommended dual antiplatelet therapy with aspirin Plavix for at least 12 months.  Aspirin and Plavix are currently on hold, continue statins, Imdur.  She has mildly elevated troponin, ED PA is going to consult cardiology for further recommendations.  Patient is not having any chest pain at this time.  4. CHF, combined systolic and diastolic-Lasix currently on hold due to elevated BUN, 64.  Though BUN can also be elevated from GI bleed.  Consider restarting Lasix after endoscopy.  5. Hypertension-blood pressures mildly better, continue hydralazine, metoprolol.  6. Left radius and ulna fracture-s/p reduction and casting of distal radius and ulna fracture.  Patient to follow-up with orthopedics as outpatient.    DVT Prophylaxis-   SCDs  AM Labs Ordered, also please review Full Orders  Family Communication: Admission, patients condition and plan of care including tests being ordered have been discussed with the patient  who indicate understanding and agree with the plan and Code Status.  Code Status: Full code  Admission status: Inpatient: Based on patients clinical presentation and evaluation of above clinical data, I have made determination that patient meets Inpatient criteria at this time.  Time spent in minutes : 60 minutes   Eudora Guevarra S Jaeveon Ashland M.D

## 2019-01-19 ENCOUNTER — Inpatient Hospital Stay (HOSPITAL_COMMUNITY): Payer: Medicare PPO

## 2019-01-19 ENCOUNTER — Inpatient Hospital Stay (HOSPITAL_COMMUNITY): Payer: Medicare PPO | Admitting: Anesthesiology

## 2019-01-19 ENCOUNTER — Encounter (HOSPITAL_COMMUNITY)
Admission: EM | Disposition: A | Discharge status: Home / Self Care | Payer: Self-pay | Source: Home / Self Care | Attending: Internal Medicine

## 2019-01-19 ENCOUNTER — Other Ambulatory Visit: Payer: Self-pay

## 2019-01-19 ENCOUNTER — Encounter (HOSPITAL_COMMUNITY): Payer: Self-pay | Admitting: Family Medicine

## 2019-01-19 DIAGNOSIS — I5042 Chronic combined systolic (congestive) and diastolic (congestive) heart failure: Secondary | ICD-10-CM

## 2019-01-19 DIAGNOSIS — S52532A Colles' fracture of left radius, initial encounter for closed fracture: Secondary | ICD-10-CM

## 2019-01-19 DIAGNOSIS — R9431 Abnormal electrocardiogram [ECG] [EKG]: Secondary | ICD-10-CM | POA: Insufficient documentation

## 2019-01-19 DIAGNOSIS — R778 Other specified abnormalities of plasma proteins: Secondary | ICD-10-CM

## 2019-01-19 DIAGNOSIS — I214 Non-ST elevation (NSTEMI) myocardial infarction: Secondary | ICD-10-CM

## 2019-01-19 DIAGNOSIS — D649 Anemia, unspecified: Secondary | ICD-10-CM | POA: Insufficient documentation

## 2019-01-19 DIAGNOSIS — I10 Essential (primary) hypertension: Secondary | ICD-10-CM

## 2019-01-19 DIAGNOSIS — D6489 Other specified anemias: Secondary | ICD-10-CM

## 2019-01-19 DIAGNOSIS — E785 Hyperlipidemia, unspecified: Secondary | ICD-10-CM

## 2019-01-19 DIAGNOSIS — K921 Melena: Secondary | ICD-10-CM

## 2019-01-19 DIAGNOSIS — T148XXA Other injury of unspecified body region, initial encounter: Secondary | ICD-10-CM | POA: Insufficient documentation

## 2019-01-19 DIAGNOSIS — K922 Gastrointestinal hemorrhage, unspecified: Secondary | ICD-10-CM | POA: Diagnosis present

## 2019-01-19 LAB — ECHOCARDIOGRAM LIMITED
Height: 60 in
Weight: 1376 oz

## 2019-01-19 LAB — SARS CORONAVIRUS 2 (TAT 6-24 HRS): SARS Coronavirus 2: NEGATIVE

## 2019-01-19 LAB — CBC
HCT: 20.7 % — ABNORMAL LOW (ref 36.0–46.0)
Hemoglobin: 6.7 g/dL — CL (ref 12.0–15.0)
MCH: 32.8 pg (ref 26.0–34.0)
MCHC: 32.4 g/dL (ref 30.0–36.0)
MCV: 101.5 fL — ABNORMAL HIGH (ref 80.0–100.0)
Platelets: 180 10*3/uL (ref 150–400)
RBC: 2.04 MIL/uL — ABNORMAL LOW (ref 3.87–5.11)
RDW: 14.3 % (ref 11.5–15.5)
WBC: 10.3 10*3/uL (ref 4.0–10.5)
nRBC: 0 % (ref 0.0–0.2)

## 2019-01-19 LAB — COMPREHENSIVE METABOLIC PANEL
ALT: 16 U/L (ref 0–44)
AST: 57 U/L — ABNORMAL HIGH (ref 15–41)
Albumin: 2.6 g/dL — ABNORMAL LOW (ref 3.5–5.0)
Alkaline Phosphatase: 36 U/L — ABNORMAL LOW (ref 38–126)
Anion gap: 9 (ref 5–15)
BUN: 53 mg/dL — ABNORMAL HIGH (ref 8–23)
CO2: 22 mmol/L (ref 22–32)
Calcium: 8 mg/dL — ABNORMAL LOW (ref 8.9–10.3)
Chloride: 103 mmol/L (ref 98–111)
Creatinine, Ser: 1.11 mg/dL — ABNORMAL HIGH (ref 0.44–1.00)
GFR calc Af Amer: 57 mL/min — ABNORMAL LOW (ref >60)
GFR calc non Af Amer: 49 mL/min — ABNORMAL LOW (ref >60)
Glucose, Bld: 138 mg/dL — ABNORMAL HIGH (ref 70–99)
Potassium: 3.9 mmol/L (ref 3.5–5.1)
Sodium: 134 mmol/L — ABNORMAL LOW (ref 135–145)
Total Bilirubin: 0.6 mg/dL (ref 0.3–1.2)
Total Protein: 5 g/dL — ABNORMAL LOW (ref 6.5–8.1)

## 2019-01-19 LAB — TROPONIN I (HIGH SENSITIVITY): Troponin I (High Sensitivity): 4940 ng/L (ref <18)

## 2019-01-19 LAB — URINALYSIS, ROUTINE W REFLEX MICROSCOPIC
Bilirubin Urine: NEGATIVE
Glucose, UA: NEGATIVE mg/dL
Ketones, ur: NEGATIVE mg/dL
Leukocytes,Ua: NEGATIVE
Nitrite: NEGATIVE
Protein, ur: 100 mg/dL — AB
Specific Gravity, Urine: 1.019 (ref 1.005–1.030)
pH: 5 (ref 5.0–8.0)

## 2019-01-19 LAB — PREPARE RBC (CROSSMATCH)

## 2019-01-19 LAB — ABO/RH: ABO/RH(D): A NEG

## 2019-01-19 SURGERY — ESOPHAGOGASTRODUODENOSCOPY (EGD) WITH PROPOFOL
Anesthesia: Monitor Anesthesia Care

## 2019-01-19 MED ORDER — SODIUM CHLORIDE 0.9 % IV SOLN: 250.0000 mL | INTRAVENOUS | Status: DC | PRN

## 2019-01-19 MED ORDER — METOPROLOL SUCCINATE ER 25 MG PO TB24
12.5000 mg | ORAL_TABLET | Freq: Every day | ORAL | Status: DC
  Administered 2019-01-19 – 2019-01-24 (×6): 12.5 mg via ORAL
  Filled 2019-01-19 (×6): qty 1

## 2019-01-19 MED ORDER — SODIUM CHLORIDE 0.9 % IV SOLN: INTRAVENOUS | Status: DC

## 2019-01-19 MED ORDER — ISOSORBIDE MONONITRATE ER 30 MG PO TB24
15.0000 mg | ORAL_TABLET | Freq: Every day | ORAL | Status: DC
  Administered 2019-01-20 – 2019-01-24 (×5): 15 mg via ORAL
  Filled 2019-01-19 (×6): qty 1

## 2019-01-19 MED ORDER — FUROSEMIDE 10 MG/ML IJ SOLN: 40.0000 mg | Freq: Once | INTRAMUSCULAR | Status: DC

## 2019-01-19 MED ORDER — NITROGLYCERIN 0.4 MG SL SUBL: 0.4000 mg | SUBLINGUAL_TABLET | SUBLINGUAL | Status: DC | PRN

## 2019-01-19 MED ORDER — ACETAMINOPHEN 650 MG RE SUPP: 650.0000 mg | Freq: Four times a day (QID) | RECTAL | Status: DC | PRN

## 2019-01-19 MED ORDER — ONDANSETRON HCL 4 MG/2ML IJ SOLN: 4.0000 mg | Freq: Four times a day (QID) | INTRAMUSCULAR | Status: DC | PRN

## 2019-01-19 MED ORDER — PANTOPRAZOLE SODIUM 40 MG IV SOLR: 40.0000 mg | Freq: Two times a day (BID) | INTRAVENOUS | Status: DC

## 2019-01-19 MED ORDER — SODIUM CHLORIDE 0.9 % IV SOLN
8.0000 mg/h | INTRAVENOUS | Status: DC
  Administered 2019-01-20 (×2): 8 mg/h via INTRAVENOUS
  Filled 2019-01-19 (×10): qty 80

## 2019-01-19 MED ORDER — OXYCODONE HCL 5 MG PO TABS
5.0000 mg | ORAL_TABLET | Freq: Four times a day (QID) | ORAL | Status: DC | PRN
  Administered 2019-01-19 – 2019-01-23 (×3): 5 mg via ORAL
  Filled 2019-01-19 (×3): qty 1

## 2019-01-19 MED ORDER — ROSUVASTATIN CALCIUM 5 MG PO TABS
5.0000 mg | ORAL_TABLET | Freq: Every day | ORAL | Status: DC
  Administered 2019-01-20 – 2019-01-24 (×5): 5 mg via ORAL
  Filled 2019-01-19 (×5): qty 1

## 2019-01-19 MED ORDER — SODIUM CHLORIDE 0.9% FLUSH
3.0000 mL | Freq: Two times a day (BID) | INTRAVENOUS | Status: DC
  Administered 2019-01-19 – 2019-01-21 (×3): 3 mL via INTRAVENOUS

## 2019-01-19 MED ORDER — SODIUM CHLORIDE 0.9% IV SOLUTION: Freq: Once | INTRAVENOUS | Status: DC

## 2019-01-19 MED ORDER — KETAMINE HCL 50 MG/5ML IJ SOSY
PREFILLED_SYRINGE | INTRAMUSCULAR | Status: AC
  Filled 2019-01-19: qty 5

## 2019-01-19 MED ORDER — SODIUM CHLORIDE 0.9% FLUSH: 3.0000 mL | INTRAVENOUS | Status: DC | PRN

## 2019-01-19 MED ORDER — PROPOFOL 10 MG/ML IV BOLUS
INTRAVENOUS | Status: AC
  Filled 2019-01-19: qty 40

## 2019-01-19 MED ORDER — FUROSEMIDE 10 MG/ML IJ SOLN
20.0000 mg | Freq: Once | INTRAMUSCULAR | Status: AC
  Administered 2019-01-19: 20 mg via INTRAVENOUS
  Filled 2019-01-19: qty 2

## 2019-01-19 MED ORDER — ACETAMINOPHEN 325 MG PO TABS: 650.0000 mg | ORAL_TABLET | Freq: Four times a day (QID) | ORAL | Status: DC | PRN

## 2019-01-19 MED ORDER — ONDANSETRON HCL 4 MG PO TABS: 4.0000 mg | ORAL_TABLET | Freq: Four times a day (QID) | ORAL | Status: DC | PRN

## 2019-01-19 MED ORDER — HYDRALAZINE HCL 25 MG PO TABS
25.0000 mg | ORAL_TABLET | Freq: Three times a day (TID) | ORAL | Status: DC
  Administered 2019-01-19 – 2019-01-24 (×12): 25 mg via ORAL
  Filled 2019-01-19 (×12): qty 1

## 2019-01-19 NOTE — ED Notes (Signed)
Cardiology at bedside.

## 2019-01-19 NOTE — Progress Notes (Signed)
*  PRELIMINARY RESULTS* Echocardiogram Limited 2-D Echocardiogram has been performed.  Susan [name] 01/19/2019, 2:01 PM

## 2019-01-19 NOTE — ED Notes (Signed)
Cardiology messaged back that he plans to see the pt in the ED

## 2019-01-19 NOTE — ED Notes (Signed)
Blood bank notified Re: blood still infusing over 4 hours, blood bank advised to contact MD, attending MD notified and aware, plan is to infuse another unit of blood, will continue to monitor the pt

## 2019-01-19 NOTE — ED Notes (Signed)
Messaged GI re: if pt is to receive oral Metoprolol and meds since plan is to have endo completed later today

## 2019-01-19 NOTE — ED Notes (Signed)
Assumed care.  Pt is process of getting blood, tolerating well.  No reaction noted.

## 2019-01-19 NOTE — ED Notes (Signed)
Maud Deed on phone with Blood bank

## 2019-01-19 NOTE — Consult Note (Signed)
Referring Provider: [facility] Primary Care Physician:  [name], MD Primary Gastroenterologist:  Dr. Darrick Penna, (previously unassigned)  Date of Admission: 01/18/2019 Date of Consultation: 01/19/2019  Reason for Consultation:  GI bleed, anemia  HPI:  Susan [name] is a 74 y.o. female with a past medical history of acute on chronic systolic and diastolic heart failure, CAD, CKD, COPD, GERD.  Last documented ejection fraction of 30%.  Patient with mild dementia.  She became quite weak in the past couple days and subsequently had a fall at home with a fractured wrist.  The obvious deformity was reduced in the emergency room with still some mild displacement and will need follow-up with orthopedic [name].  Denies vomiting, diarrhea.  Hemoglobin 12/01/2018 was 14.7.  On admission to the emergency room was 9.2 with stool guaiac positive.  Patient had been complaining of black-colored stools for the past few days on aspirin and Plavix after suffering an NSTEMI.  Her Plavix and aspirin was held.  She was made n.p.o. for possible morning endoscopy.  She is started on Protonix 40 mg IV every 12 hours.  Vitals noted to be stable.  GI was consulted.  No need for transfusion at this time but recommended transfusing for hemoglobin of less than 7.0.  Recent cardiac cath in November 2020 status post CABG x5 with stable overall appearance of coronary arteries and severe three-vessel coronary artery disease including chronic total occlusion of the mid LAD, mid circumflex, and proximal RCA.  Recommended medical therapy with DAPT.  SARS-CoV-2 test pending  Today she states she's doing ok overall. Denies chest pain. States she had some weakness, new, over the past few days. Has had black stools as well. Denies hematochezia. Denies overt GERD symptoms. Does not take any NSAIDs (other than ASA as part of DAPT regimen). Denies abdominal pain, N/V, fever. Her arm (fracture) doesn't hurt too bad. Has had some mild  dyspnea along with her weakness. Denies any other GI complaints.  OF NOTE: This morning high-sensitivity troponin went up to 4940.  EKG with diffuse ST depression.  No chest pain at the time.  Not a candidate for anticoagulation due to GI bleed.  Hemoglobin declined to 6.7.  A unit of PRBC was ordered.  Cardiology was consulted for possible recurrent NSTEMI.  Past Medical History:  Diagnosis Date  . Acute on chronic systolic and diastolic heart failure, NYHA class 1 (HCC) 11/21/2018  . Anxiety   . CAD (coronary artery disease)    a. s/p CABG x5 in 2000 b. cath in 02/2017 showing 2/5 patent grafts with patent LIMA-LAD, patent SVG-distal RCA, occluded SVG-D1 and occluded seq-SVG-OM1-OM2 with medical management recommended and possible PCI of distal RCA branches in the future if recurrent anginal symptoms  . CHF (congestive heart failure) (HCC)    a. EF 20-25% by echo in 12/2016 b. EF 45-50% by echo in 05/2017  . Chronic bronchitis (HCC)   . CKD (chronic kidney disease) stage 3, GFR 30-59 ml/min 11/21/2018  . COPD (chronic obstructive pulmonary disease) (HCC)   . Dizziness   . Esophageal reflux   . Essential hypertension   . Fatigue   . HLD (hyperlipidemia) 11/21/2018  . HTN (hypertension) 11/21/2018  . Hypercholesteremia   . Hyperkalemia   . Ovarian failure   . SOB (shortness of breath)     Past Surgical History:  Procedure Laterality Date  . ABDOMINAL HYSTERECTOMY    . APPENDECTOMY    . BREAST BIOPSY Right   . CARDIAC CATHETERIZATION  2000  . CORONARY ARTERY BYPASS GRAFT  2000   CABG X5  . LEFT HEART CATH AND CORS/GRAFTS ANGIOGRAPHY N/A 02/17/2017   Procedure: LEFT HEART CATH AND CORS/GRAFTS ANGIOGRAPHY;  Surgeon: Burnell Blanks, MD;  Location: [facility];  Service: Cardiovascular;  Laterality: N/A;  . LEFT HEART CATH AND CORS/GRAFTS ANGIOGRAPHY N/A 11/20/2018   Procedure: LEFT HEART CATH AND CORS/GRAFTS ANGIOGRAPHY;  Surgeon: Nelva Bush, MD;  Location: [facility];  Service: Cardiovascular;  Laterality: N/A;    Prior to Admission medications   Medication Sig Start Date End Date Taking? Authorizing Provider  aspirin EC 81 MG tablet Take 81 mg by mouth daily.   Yes [provider]  clopidogrel (PLAVIX) 75 MG tablet Take 1 tablet (75 mg total) by mouth daily with breakfast. 11/22/18  Yes Isaiah Serge, NP  furosemide (LASIX) 40 MG tablet Take 1 tablet (40 mg total) by mouth daily. (may take extra as needed for weight gain of 3 pounds in 1 day or 5 pounds in 1 week) 12/11/18  Yes Herminio Commons, MD  hydrALAZINE (APRESOLINE) 25 MG tablet Take 1 tablet (25 mg total) by mouth 3 (three) times daily. 12/11/18  Yes Herminio Commons, MD  isosorbide mononitrate (IMDUR) 30 MG 24 hr tablet Take 0.5 tablets (15 mg total) by mouth daily. 11/22/18  Yes Isaiah Serge, NP  metoprolol succinate (TOPROL-XL) 25 MG 24 hr tablet Take 0.5 tablets (12.5 mg total) by mouth daily. 11/22/18  Yes Isaiah Serge, NP  Multiple Vitamin (MULTIVITAMIN) tablet Take 1 tablet by mouth daily.   Yes [provider]  nitroGLYCERIN (NITROSTAT) 0.4 MG SL tablet Place 1 tablet (0.4 mg total) under the tongue every 5 (five) minutes x 3 doses as needed for chest pain. 02/18/17  Yes Kilroy, Luke K, PA-C  potassium chloride SA (KLOR-CON M20) 20 MEQ tablet Take 1 tablet (20 mEq total) by mouth daily. (may take one extra tab when taking extra Lasix) 12/11/18  Yes Herminio Commons, MD  rosuvastatin (CRESTOR) 5 MG tablet Take 5 mg by mouth daily. 12/22/18  Yes [provider]  vitamin B-12 (CYANOCOBALAMIN) 250 MCG tablet Take 250 mcg by mouth daily.   Yes [provider]    Current Facility-Administered Medications  Medication Dose Route Frequency Provider Last Rate Last Admin  . 0.9 %  sodium chloride infusion (Manually program via Guardrails IV Fluids)   Intravenous Once [place], Gagan S, MD      . 0.9 %  sodium chloride infusion  250 mL  Intravenous PRN Darrick Meigs, Marge Duncans, MD      . acetaminophen (TYLENOL) tablet 650 mg  650 mg Oral Q6H PRN Oswald Hillock, MD       Or  . acetaminophen (TYLENOL) suppository 650 mg  650 mg Rectal Q6H PRN Oswald Hillock, MD      . hydrALAZINE (APRESOLINE) tablet 25 mg  25 mg Oral TID Oswald Hillock, MD      . isosorbide mononitrate (IMDUR) 24 hr tablet 15 mg  15 mg Oral Daily Oswald Hillock, MD      . metoprolol succinate (TOPROL-XL) 24 hr tablet 12.5 mg  12.5 mg Oral Daily Darrick Meigs, Marge Duncans, MD      . nitroGLYCERIN (NITROSTAT) SL tablet 0.4 mg  0.4 mg Sublingual Q5 Min x 3 PRN Oswald Hillock, MD      . ondansetron [name]) tablet 4 mg  4 mg Oral Q6H PRN  Meredeth Ide, MD       Or  . ondansetron [name]) injection 4 mg  4 mg Intravenous Q6H PRN Meredeth Ide, MD      . pantoprazole (PROTONIX) injection 40 mg  40 mg Intravenous Q12H Meredeth Ide, MD   Stopped at 01/19/19 0630  . rosuvastatin (CRESTOR) tablet 5 mg  5 mg Oral Daily [place], Gagan S, MD      . sodium chloride flush (NS) 0.9 % injection 3 mL  3 mL Intravenous Q12H [place], Sarina Ill, MD      . sodium chloride flush (NS) 0.9 % injection 3 mL  3 mL Intravenous PRN Meredeth Ide, MD       Current Outpatient Medications  Medication Sig Dispense Refill  . aspirin EC 81 MG tablet Take 81 mg by mouth daily.    . clopidogrel (PLAVIX) 75 MG tablet Take 1 tablet (75 mg total) by mouth daily with breakfast. 30 tablet 11  . furosemide (LASIX) 40 MG tablet Take 1 tablet (40 mg total) by mouth daily. (may take extra as needed for weight gain of 3 pounds in 1 day or 5 pounds in 1 week) 100 tablet 3  . hydrALAZINE (APRESOLINE) 25 MG tablet Take 1 tablet (25 mg total) by mouth 3 (three) times daily. 270 tablet 3  . isosorbide mononitrate (IMDUR) 30 MG 24 hr tablet Take 0.5 tablets (15 mg total) by mouth daily. 15 tablet 6  . metoprolol succinate (TOPROL-XL) 25 MG 24 hr tablet Take 0.5 tablets (12.5 mg total) by mouth daily. 15 tablet 6  . Multiple Vitamin (MULTIVITAMIN)  tablet Take 1 tablet by mouth daily.    . nitroGLYCERIN (NITROSTAT) 0.4 MG SL tablet Place 1 tablet (0.4 mg total) under the tongue every 5 (five) minutes x 3 doses as needed for chest pain. 25 tablet 3  . potassium chloride SA (KLOR-CON M20) 20 MEQ tablet Take 1 tablet (20 mEq total) by mouth daily. (may take one extra tab when taking extra Lasix) 100 tablet 3  . rosuvastatin (CRESTOR) 5 MG tablet Take 5 mg by mouth daily.    . vitamin B-12 (CYANOCOBALAMIN) 250 MCG tablet Take 250 mcg by mouth daily.      Allergies as of 01/18/2019  . (No Known Allergies)    Family History  Problem Relation Age of Onset  . Heart attack Mother   . Hypertension Father     Social History   Socioeconomic History  . Marital status: Married    Spouse name: Not on file  . Number of children: Not on file  . Years of education: Not on file  . Highest education level: Not on file  Occupational History  . Not on file  Tobacco Use  . Smoking status: Current Every Day Smoker    Packs/day: 1.00    Years: 50.00    Pack years: 50.00    Types: Cigarettes    Last attempt to quit: 02/22/2017    Years since quitting: 1.9  . Smokeless tobacco: Never Used  Substance and Sexual Activity  . Alcohol use: No  . Drug use: No  . Sexual activity: Never  Other Topics Concern  . Not on file  Social History Narrative  . Not on file   Social Determinants of Health   Financial Resource Strain:   . Difficulty of Paying Living Expenses: Not on file  Food Insecurity:   . Worried About Programme researcher, broadcasting/film/video in the Last Year: Not  on file  . Ran Out of Food in the Last Year: Not on file  Transportation Needs:   . Lack of Transportation (Medical): Not on file  . Lack of Transportation (Non-Medical): Not on file  Physical Activity:   . Days of Exercise per Week: Not on file  . Minutes of Exercise per Session: Not on file  Stress:   . Feeling of Stress : Not on file  Social Connections:   . Frequency of Communication  with Friends and Family: Not on file  . Frequency of Social Gatherings with Friends and Family: Not on file  . Attends Religious Services: Not on file  . Active Member of Clubs or Organizations: Not on file  . Attends Banker Meetings: Not on file  . Marital Status: Not on file  Intimate Partner Violence:   . Fear of Current or Ex-Partner: Not on file  . Emotionally Abused: Not on file  . Physically Abused: Not on file  . Sexually Abused: Not on file    Review of Systems: General: Negative for anorexia, weight loss, fever, chills, fatigue, weakness. ENT: Negative for hoarseness, difficulty swallowing. CV: Negative for chest pain, angina, palpitations, peripheral edema.  Respiratory: Negative for dyspnea at rest, cough, sputum, wheezing.  GI: See history of present illness. MS: Some left wrist pain.  Derm: Negative for rash or itching.  Endo: Negative for unusual weight change.  Heme: Negative for bruising or bleeding. Allergy: Negative for rash or hives.  Physical Exam: Vital signs in last 24 hours: Temp:  [98.1 F (36.7 C)] 98.1 F (36.7 C) (01/08 0543) Pulse Rate:  [106-130] 118 (01/08 0730) Resp:  [17-30] 23 (01/08 0730) BP: (103-169)/(67-96) 169/96 (01/08 0730) SpO2:  [95 %-100 %] 97 % (01/08 0730) Weight:  [39 kg] 39 kg (01/07 1251)   General:   Alert,  Well-developed, well-nourished, pleasant and cooperative in NAD Head:  Normocephalic and atraumatic. Eyes:  Sclera clear, no icterus. Conjunctiva pink. Ears:  Normal auditory acuity. Neck:  Supple; no masses or thyromegaly. Lungs:  Clear throughout to auscultation. No wheezes, crackles, or rhonchi. No acute distress. Heart:  Regular rate and rhythm; no murmurs, clicks, rubs,  or gallops. Abdomen:  Soft, nontender and nondistended. No masses, hepatosplenomegaly or hernias noted. Normal bowel sounds, without guarding, and without rebound.   Rectal:  Deferred.   Msk:  Symmetrical without gross  deformities. Extremities:  Without clubbing or edema. Neurologic:  Alert and  oriented x4;  grossly normal neurologically. Skin:  Intact without significant lesions or rashes. Psych:  Alert and cooperative. Normal mood and affect. Tele Monitor: Apparent ST depression on lead II on tele monitor.  Intake/Output from previous day: No intake/output data recorded. Intake/Output this shift: No intake/output data recorded.  Lab Results: Recent Labs    01/18/19 1450 01/19/19 0335  WBC 12.4* 10.3  HGB 9.2* 6.7*  HCT 27.8* 20.7*  PLT 238 180   BMET Recent Labs    01/18/19 1450 01/19/19 0335  NA 135 134*  K 3.9 3.9  CL 102 103  CO2 23 22  GLUCOSE 117* 138*  BUN 64* 53*  CREATININE 1.09* 1.11*  CALCIUM 8.6* 8.0*   LFT Recent Labs    01/18/19 1450 01/19/19 0335  PROT 6.0* 5.0*  ALBUMIN 3.2* 2.6*  AST 20 57*  ALT 15 16  ALKPHOS 46 36*  BILITOT 0.6 0.6  BILIDIR 0.1  --   IBILI 0.5  --    PT/INR No results for input(s): LABPROT,  INR in the last 72 hours. Hepatitis Panel No results for input(s): HEPBSAG, HCVAB, HEPAIGM, HEPBIGM in the last 72 hours. C-Diff No results for input(s): CDIFFTOX in the last 72 hours.  Studies/Results: DG Chest 2 View  Result Date: 01/18/2019 CLINICAL DATA:  74 year old female with history of cough. EXAM: CHEST - 2 VIEW COMPARISON:  Chest x-ray 01/17/2019. FINDINGS: Lung volumes are increased with emphysematous changes. No acute consolidative airspace disease. No pleural effusions. No pneumothorax. No suspicious appearing pulmonary nodules or masses. No evidence of pulmonary edema. Heart size is mildly enlarged. Upper mediastinal contours are within normal limits. Aortic atherosclerosis. Status post median sternotomy for CABG including LIMA. Surgical clips projecting over the right hemithorax, presumably in the overlying breast. IMPRESSION: 1. No radiographic evidence of acute cardiopulmonary disease. 2. Emphysema. 3. Mild cardiomegaly. 4. Aortic  atherosclerosis. Electronically Signed   By: Trudie Reed M.D.   On: 01/18/2019 16:10   DG Wrist 2 Views Left  Result Date: 01/18/2019 CLINICAL DATA:  Status post reduction EXAM: LEFT WRIST - 2 VIEW COMPARISON:  01/18/2019 FINDINGS: Casting material is now seen. Previously seen distal radial and ulnar fractures are again identified. The posterior displacement has been significantly reduced although mild posterior angulation remains. No other focal abnormality is seen IMPRESSION: Reduction and casting of distal radial and ulnar fractures. Electronically Signed   By: Alcide Clever M.D.   On: 01/18/2019 21:39   DG Wrist Complete Left  Result Date: 01/18/2019 CLINICAL DATA:  Fall EXAM: LEFT HAND - COMPLETE 3+ VIEW; LEFT WRIST - COMPLETE 3+ VIEW COMPARISON:  None. FINDINGS: There are fractures of the distal left radius and ulna with dorsal angulation and mild displacement. There are marked changes of osteoarthritis at the first carpometacarpal joint. Less pronounced changes of osteoarthritis are present at the triscaphe and distal interphalangeal joints. IMPRESSION: Acute fractures of the distal left radius and ulna with dorsal angulation and mild displacement. Electronically Signed   By: Guadlupe Spanish M.D.   On: 01/18/2019 13:35   CT Head Wo Contrast  Result Date: 01/18/2019 CLINICAL DATA:  Recent fall with headaches and neck pain, initial encounter EXAM: CT HEAD WITHOUT CONTRAST CT CERVICAL SPINE WITHOUT CONTRAST TECHNIQUE: Multidetector CT imaging of the head and cervical spine was performed following the standard protocol without intravenous contrast. Multiplanar CT image reconstructions of the cervical spine were also generated. COMPARISON:  None. FINDINGS: CT HEAD FINDINGS Brain: No evidence of acute infarction, hemorrhage, hydrocephalus, extra-axial collection or mass lesion/mass effect. Mild chronic white matter ischemic changes are seen. 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 Vascular: No  hyperdense vessel or unexpected calcification. Skull: Normal. Negative for fracture or focal lesion. Sinuses/Orbits: No acute finding. Other: None. CT CERVICAL SPINE FINDINGS Alignment: Within normal limits. Skull base and vertebrae: 7 cervical segments are well visualized. Vertebral body height is well maintained. Multilevel facet hypertrophic changes are seen. No acute fracture or acute facet abnormality is noted. Soft tissues and spinal canal: Surrounding soft tissue structures are within normal limits. Upper chest: Visualized lung apices are within normal limits. Other: None IMPRESSION: CT of the head: Chronic ischemic changes without acute abnormality. CT of the cervical spine: Multilevel degenerative change without acute abnormality. Electronically Signed   By: Alcide Clever M.D.   On: 01/18/2019 17:06   CT Cervical Spine Wo Contrast  Result Date: 01/18/2019 CLINICAL DATA:  Recent fall with headaches and neck pain, initial encounter EXAM: CT HEAD WITHOUT CONTRAST CT CERVICAL SPINE WITHOUT CONTRAST TECHNIQUE: Multidetector CT imaging  of the head and cervical spine was performed following the standard protocol without intravenous contrast. Multiplanar CT image reconstructions of the cervical spine were also generated. COMPARISON:  None. FINDINGS: CT HEAD FINDINGS Brain: No evidence of acute infarction, hemorrhage, hydrocephalus, extra-axial collection or mass lesion/mass effect. Mild chronic white matter ischemic changes are seen. 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 Vascular: No hyperdense vessel or unexpected calcification. Skull: Normal. Negative for fracture or focal lesion. Sinuses/Orbits: No acute finding. Other: None. CT CERVICAL SPINE FINDINGS Alignment: Within normal limits. Skull base and vertebrae: 7 cervical segments are well visualized. Vertebral body height is well maintained. Multilevel facet hypertrophic changes are seen. No acute fracture or acute facet abnormality is noted. Soft  tissues and spinal canal: Surrounding soft tissue structures are within normal limits. Upper chest: Visualized lung apices are within normal limits. Other: None IMPRESSION: CT of the head: Chronic ischemic changes without acute abnormality. CT of the cervical spine: Multilevel degenerative change without acute abnormality. Electronically Signed   By: Alcide Clever M.D.   On: 01/18/2019 17:06   DG Hand Complete Left  Result Date: 01/18/2019 CLINICAL DATA:  Fall EXAM: LEFT HAND - COMPLETE 3+ VIEW; LEFT WRIST - COMPLETE 3+ VIEW COMPARISON:  None. FINDINGS: There are fractures of the distal left radius and ulna with dorsal angulation and mild displacement. There are marked changes of osteoarthritis at the first carpometacarpal joint. Less pronounced changes of osteoarthritis are present at the triscaphe and distal interphalangeal joints. IMPRESSION: Acute fractures of the distal left radius and ulna with dorsal angulation and mild displacement. Electronically Signed   By: Guadlupe Spanish M.D.   On: 01/18/2019 13:35    Impression: Very pleasant 74 year old female with a significant cardiac history who presented to the emergency department after 3 days of weakness, mild dyspnea, fall with a left wrist fracture, noted black stools for 3 to 4 days.  Her hemoglobin was found to be low on presentation in the emergency room at 9.2 (previously 14.7 on 12/01/2018).  It is further decreased to 6.7 this morning.  She is receiving 1 unit of blood currently.  Denies NSAIDs besides aspirin as part of DAPT regimen.  Previous cardiac cath November 2020 with persistent disease and recommended medical treatment.  Unfortunately this morning her troponins peaked to near 5000 and she began having ST depression on her EKG.  This is persistently noted on Lead II on her telemetry monitor.  She denies overt chest pain.  Acute anemia: Likely upper GI source given black stools.  Likely acute anemia rather than chronic given her sudden  decline since in the emergency room as well as new onset weakness with a fall.  She would likely benefit from upper endoscopy.  However, given her peak and troponins and ST depression suggestive of possible NSTEMI this may not be an option at this time.  Plan: 1. Start Protonix gtt 2. Monitor for further GI bleed 3. Follow hgb closely 4. Will await cardiology input 5. After cardiology see her, will discuss endoscopic options further with Dr. Darrick Penna 6. Can have mouth swabs for comfort 7. NPO   Thank you for allowing [place] to participate in the care of Susan [name]  Wynne Dust, DNP, AGNP-C Adult & Gerontological Nurse Practitioner [name]    LOS: 1 day     01/19/2019, 8:37 AM

## 2019-01-19 NOTE — Progress Notes (Signed)
EKG this morning showed diffuse ST depression, troponin obtained and the level of troponin went up to 4940.  Patient is not having any chest pain at this time.  Cannot give any anticoagulation due to GI bleed.  Aspirin and Plavix were held due to melena.  Hemoglobin dropped to 6.7 this morning.  1 unit PRBC ordered. ?  Non-STEMI Will consult cardiology for further recommendations.

## 2019-01-19 NOTE — Consult Note (Addendum)
Cardiology Consult    Patient ID: Susan [name]; [id number]; [birth year]   Admit date: 01/18/2019 Date of Consult: 01/19/2019  Primary Care Provider: [name], MD Primary Cardiologist: Prentice Docker, MD   Patient Profile    Susan [name] is a 74 y.o. female with past medical history of CAD (s/p CABG x5 in 2000 with cath in 02/2017 showing 2/5 patent grafts with patent LIMA-LAD, patent SVG-distal RCA, occluded SVG-D1 and occluded seq-SVG-OM1-OM2 with medical management recommended, repeat cath in 11/2018 showing stable anatomy with patent LIMA-LAD and patent SVG-dRCA with 50% mid-graft stenosis and medical therapy recommended), chronic combined systolic and diastolic (EF 20-25% by echo in 12/2016, at 45-50% by echo in 05/2017, 30% by repeat echo in 11/2018), HTN, HLD, COPD and continued tobacco use who is being seen today for the evaluation of NSTEMI at the request of Dr. Sharl Ma.   History of Present Illness    Susan [name] was most recently admitted to [facility] in 11/2018 as a transfer from [facility] for an NSTEMI. HS Troponin values peaked at 1419 and echocardiogram showed her EF was reduced to 30%. She underwent a repeat cardiac catheterization which showed similar anatomy to her prior catheterization in 02/2017 and continued medical management was recommended with consideration of complex PCI to the distal RCA in the future if she had recurrent angina. She did follow-up with Dr. Purvis Sheffield in 11/2018 and was continued on ASA, Plavix, Toprol-XL 12.5mg  daily, Hydralazine  TID, Imdur  daily and Lasix 40 mg daily.  She presented to [facility] ED on 01/18/2019 for evaluation of worsening weakness for the past month.  The patient spouse also reported she had been having a decreased appetite but denied any associated vomiting or diarrhea.  She did fall prior to admission and fell on her left wrist.  She denies any specific pain to the site.  Denies any actual syncopal events. From a  cardiac perspective, she reports overall doing well and denies any recurrent chest pain since her hospitalization in 11/2018. No current chest pain this admission. She reports her breathing has overall been at baseline and denies any specific orthopnea, PND or lower extremity edema.  Initial labs showed WBC 12.4, Hgb 9.2, platelets 238, Na+ 135, K+ 3.9 and creatinine 1.09. Initial HS Troponin 195 with repeat of 4940. BNP 496. Occult blood positive in the ED and repeat labs this AM show Hgb has declined to 6.7. COVID pending. CXR showed emphysema with mild cardiomegaly and aortic atherosclerosis. CT Head showed chronic ischemic changes without any acute abnormalities. Imaging of her left wrist showed acute fractures of the distal left radius and ulna with dorsal angulation and mild displacement. EKG shows sinus tachycardia, HR 114 with LVH and diffuse ST depression along lateral leads which is similar to prior tracings but more prominent. Repeat this AM shows worsening ST depression along the inferior and lateral leads with slight ST elevation along V1 and V2 with considerable elevation along AVR.   Past Medical History:  Diagnosis Date   Acute on chronic systolic and diastolic heart failure, NYHA class 1 (HCC) 11/21/2018   Anxiety    CAD (coronary artery disease)    a. s/p CABG x5 in 2000 b. cath in 02/2017 showing 2/5 patent grafts with patent LIMA-LAD, patent SVG-distal RCA, occluded SVG-D1 and occluded seq-SVG-OM1-OM2 with medical management recommended and possible PCI of distal RCA branches in the future if recurrent anginal symptoms   CHF (congestive heart failure) (HCC)    a. EF 20-25%  by echo in 12/2016 b. EF 45-50% by echo in 05/2017 c. EF 30% by echo in 11/2018   Chronic bronchitis (HCC)    CKD (chronic kidney disease) stage 3, GFR 30-59 ml/min 11/21/2018   COPD (chronic obstructive pulmonary disease) (HCC)    Dizziness    Esophageal reflux    Essential hypertension    Fatigue    HLD  (hyperlipidemia) 11/21/2018   HTN (hypertension) 11/21/2018   Hypercholesteremia    Hyperkalemia    Ovarian failure    SOB (shortness of breath)     Past Surgical History:  Procedure Laterality Date   ABDOMINAL HYSTERECTOMY     APPENDECTOMY     BREAST BIOPSY Right    CARDIAC CATHETERIZATION  2000   CORONARY ARTERY BYPASS GRAFT  2000   CABG X5   LEFT HEART CATH AND CORS/GRAFTS ANGIOGRAPHY N/A 02/17/2017   Procedure: LEFT HEART CATH AND CORS/GRAFTS ANGIOGRAPHY;  Surgeon: Kathleene Hazel, MD;  Location: [facility] INVASIVE CV LAB;  Service: Cardiovascular;  Laterality: N/A;   LEFT HEART CATH AND CORS/GRAFTS ANGIOGRAPHY N/A 11/20/2018   Procedure: LEFT HEART CATH AND CORS/GRAFTS ANGIOGRAPHY;  Surgeon: Yvonne Kendall, MD;  Location: [facility] INVASIVE CV LAB;  Service: Cardiovascular;  Laterality: N/A;     Home Medications:  Prior to Admission medications   Medication Sig Start Date End Date Taking? Authorizing Provider  aspirin EC 81 MG tablet Take 81 mg by mouth daily.   Yes [provider]  clopidogrel (PLAVIX) 75 MG tablet Take 1 tablet (75 mg total) by mouth daily with breakfast. 11/22/18  Yes Leone Brand, NP  furosemide (LASIX) 40 MG tablet Take 1 tablet (40 mg total) by mouth daily. (may take extra as needed for weight gain of 3 pounds in 1 day or 5 pounds in 1 week) 12/11/18  Yes Laqueta Linden, MD  hydrALAZINE (APRESOLINE) 25 MG tablet Take 1 tablet (25 mg total) by mouth 3 (three) times daily. 12/11/18  Yes Laqueta Linden, MD  isosorbide mononitrate (IMDUR) 30 MG 24 hr tablet Take 0.5 tablets (15 mg total) by mouth daily. 11/22/18  Yes Leone Brand, NP  metoprolol succinate (TOPROL-XL) 25 MG 24 hr tablet Take 0.5 tablets (12.5 mg total) by mouth daily. 11/22/18  Yes Leone Brand, NP  Multiple Vitamin (MULTIVITAMIN) tablet Take 1 tablet by mouth daily.   Yes [provider]  nitroGLYCERIN (NITROSTAT) 0.4 MG SL tablet Place 1 tablet (0.4 mg total)  under the tongue every 5 (five) minutes x 3 doses as needed for chest pain. 02/18/17  Yes Kilroy, Luke K, PA-C  potassium chloride SA (KLOR-CON M20) 20 MEQ tablet Take 1 tablet (20 mEq total) by mouth daily. (may take one extra tab when taking extra Lasix) 12/11/18  Yes Laqueta Linden, MD  rosuvastatin (CRESTOR) 5 MG tablet Take 5 mg by mouth daily. 12/22/18  Yes [provider]  vitamin B-12 (CYANOCOBALAMIN) 250 MCG tablet Take 250 mcg by mouth daily.   Yes [provider]    Inpatient Medications: Scheduled Meds:  sodium chloride   Intravenous Once   hydrALAZINE  25 mg Oral TID   isosorbide mononitrate  15 mg Oral Daily   metoprolol succinate  12.5 mg Oral Daily   pantoprazole (PROTONIX) IV  40 mg Intravenous Q12H   rosuvastatin  5 mg Oral Daily   sodium chloride flush  3 mL Intravenous Q12H   Continuous Infusions:  sodium chloride     PRN Meds: sodium chloride,  acetaminophen **OR** acetaminophen, nitroGLYCERIN, ondansetron **OR** ondansetron (ZOFRAN) IV, sodium chloride flush  Allergies:   No Known Allergies  Social History:   Social History   Socioeconomic History   Marital status: Married    Spouse name: Not on file   Number of children: Not on file   Years of education: Not on file   Highest education level: Not on file  Occupational History   Not on file  Tobacco Use   Smoking status: Current Every Day Smoker    Packs/day: 1.00    Years: 50.00    Pack years: 50.00    Types: Cigarettes    Last attempt to quit: 02/22/2017    Years since quitting: 1.9   Smokeless tobacco: Never Used  Substance and Sexual Activity   Alcohol use: No   Drug use: No   Sexual activity: Never  Other Topics Concern   Not on file  Social History Narrative   Not on file   Social Determinants of Health   Financial Resource Strain:    Difficulty of Paying Living Expenses: Not on file  Food Insecurity:    Worried About Programme researcher, broadcasting/film/video in the Last Year: Not on  file   [employer] of Food in the Last Year: Not on file  Transportation Needs:    Lack of Transportation (Medical): Not on file   Lack of Transportation (Non-Medical): Not on file  Physical Activity:    Days of Exercise per Week: Not on file   Minutes of Exercise per Session: Not on file  Stress:    Feeling of Stress : Not on file  Social Connections:    Frequency of Communication with Friends and Family: Not on file   Frequency of Social Gatherings with Friends and Family: Not on file   Attends Religious Services: Not on file   Active Member of Clubs or Organizations: Not on file   Attends Banker Meetings: Not on file   Marital Status: Not on file  Intimate Partner Violence:    Fear of Current or Ex-Partner: Not on file   Emotionally Abused: Not on file   Physically Abused: Not on file   Sexually Abused: Not on file     Family History:    Family History  Problem Relation Age of Onset   Heart attack Mother    Hypertension Father       Review of Systems    General:  No chills, fever, night sweats or weight changes. Positive for weakness and falls.  Cardiovascular:  No chest pain, dyspnea on exertion, edema, orthopnea, palpitations, paroxysmal nocturnal dyspnea. Dermatological: No rash, lesions/masses Respiratory: No cough, dyspnea Urologic: No hematuria, dysuria Abdominal:   No nausea, vomiting, diarrhea, bright red blood per rectum, melena, or hematemesis Neurologic:  No visual changes, wkns, changes in mental status.  All other systems reviewed and are otherwise negative except as noted above.  Physical Exam/Data    Vitals:   01/19/19 0700 01/19/19 0730 01/19/19 1033 01/19/19 1045  BP: (!) 151/92 (!) 169/96 (!) 140/92   Pulse: (!) 114 (!) 118 (!) 108 (!) 105  Resp: (!) 24 (!) 23 (!) 22 (!) 22  Temp:      TempSrc:      SpO2: 98% 97% 99% 98%  Weight:      Height:       No intake or output data in the 24 hours ending 01/19/19 1113 Filed Weights    01/18/19 1251  Weight: 39 kg  Body mass index is 16.8 kg/m.   General: Pleasant, elderly Caucasian female appearing in NAD Psych: Normal affect. Neuro: Alert and oriented X 3. Moves all extremities spontaneously. HEENT: Normal  Neck: Supple without bruits or JVD. Lungs:  Resp regular and unlabored, CTA without wheezing or rales. Heart: Regular rhythm, tachycardiac rate. no s3, s4, or murmurs. Abdomen: Soft, non-tender, non-distended, BS + x 4.  Extremities: No clubbing, cyanosis or lower extremity edema. DP/PT/Radials 2+ and equal bilaterally. Left arm in cast.    EKG:  The EKG was personally reviewed and demonstrates: Sinus tachycardia, HR 114 with LVH and diffuse ST depression along lateral leads which is similar to prior tracings but more prominent.  Telemetry:  Telemetry was personally reviewed and demonstrates: Sinus tachycardia, HR in low-100's to 110's.    Labs/Studies     Relevant CV Studies:  Cardiac Catheterization: 11/2018 Conclusions: Overall stable appearance of the coronary arteries with severe three-vessel coronary artery disease, including chronic total occlusions of the mid LAD, mid LCx, and proximal RCA. Widely patent LIMA-LAD. Patent SVG-distal RCA with up to 50% stenosis in the mid graft.  Bifurcation disease involving the distal RCA is similar to prior catheterization, with 90% lesions involving the ostial/proximal rPDA and ostial/proximal RCA continuation. Normal left ventricular filling pressure.   Recommendations: Medical optimization, including escalation of evidence-based heart failure therapy, improved blood pressure control, and smoking cessation. Dual antiplatelet therapy with aspirin and clopidogrel for at least 12 months, if tolerated. If symptoms recur, complex PCI to the distal RCA bifurcation will need to be considered.  As previously noted, this may requiring "kissing stents" at the ostia of the rPDA and RCA continuation.  This would be a  potentially high risk procedure, given the territory supplied (via native vessels and collaterals).  Echocardiogram: 11/2018 IMPRESSIONS      1. Left ventricular ejection fraction, by visual estimation, is 30%. The left ventricle has severely decreased function. There is mildly increased left ventricular hypertrophy.  2. Multiple segmental abnormalities exist. See findings.  3. Left ventricular diastolic parameters are consistent with Grade I diastolic dysfunction (impaired relaxation).  4. Global right ventricle has moderately reduced systolic function.The right ventricular size is normal. No increase in right ventricular wall thickness.  5. Left atrial size was mildly dilated.  6. Right atrial size was normal.  7. Mild aortic valve annular calcification.  8. The mitral valve is grossly normal. Mild mitral valve regurgitation.  9. The tricuspid valve is grossly normal. Tricuspid valve regurgitation is mild. 10. The aortic valve is tricuspid. Aortic valve regurgitation is not visualized. Mild aortic valve sclerosis without stenosis. 11. The pulmonic valve was grossly normal. Pulmonic valve regurgitation is not visualized. 12. Moderately elevated pulmonary artery systolic pressure. 13. The tricuspid regurgitant velocity is 3.09 m/s, and with an assumed right atrial pressure of 3 mmHg, the estimated right ventricular systolic pressure is moderately elevated at 41.2 mmHg. 14. The inferior vena cava is normal in size with greater than 50% respiratory variability, suggesting right atrial pressure of 3 mmHg.  Laboratory Data:  Chemistry Recent Labs  Lab 01/18/19 1450 01/19/19 0335  NA 135 134*  K 3.9 3.9  CL 102 103  CO2 23 22  GLUCOSE 117* 138*  BUN 64* 53*  CREATININE 1.09* 1.11*  CALCIUM 8.6* 8.0*  GFRNONAA 50* 49*  GFRAA 58* 57*  ANIONGAP 10 9    Recent Labs  Lab 01/18/19 1450 01/19/19 0335  PROT 6.0* 5.0*  ALBUMIN 3.2* 2.6*  AST 20 57*  ALT 15 16  ALKPHOS 46 36*    BILITOT 0.6 0.6   Hematology Recent Labs  Lab 01/18/19 1450 01/19/19 0335  WBC 12.4* 10.3  RBC 2.78* 2.04*  HGB 9.2* 6.7*  HCT 27.8* 20.7*  MCV 100.0 101.5*  MCH 33.1 32.8  MCHC 33.1 32.4  RDW 14.4 14.3  PLT 238 180   Cardiac EnzymesNo results for input(s): TROPONINI in the last 168 hours. No results for input(s): TROPIPOC in the last 168 hours.  BNP Recent Labs  Lab 01/18/19 1450  BNP 496.0*    DDimer No results for input(s): DDIMER in the last 168 hours.  Radiology/Studies:  DG Chest 2 View  Result Date: 01/18/2019 CLINICAL DATA:  74 year old female with history of cough. EXAM: CHEST - 2 VIEW COMPARISON:  Chest x-ray 01/17/2019. FINDINGS: Lung volumes are increased with emphysematous changes. No acute consolidative airspace disease. No pleural effusions. No pneumothorax. No suspicious appearing pulmonary nodules or masses. No evidence of pulmonary edema. Heart size is mildly enlarged. Upper mediastinal contours are within normal limits. Aortic atherosclerosis. Status post median sternotomy for CABG including LIMA. Surgical clips projecting over the right hemithorax, presumably in the overlying breast. IMPRESSION: 1. No radiographic evidence of acute cardiopulmonary disease. 2. Emphysema. 3. Mild cardiomegaly. 4. Aortic atherosclerosis. Electronically Signed   By: Trudie Reed M.D.   On: 01/18/2019 16:10   DG Wrist 2 Views Left  Result Date: 01/18/2019 CLINICAL DATA:  Status post reduction EXAM: LEFT WRIST - 2 VIEW COMPARISON:  01/18/2019 FINDINGS: Casting material is now seen. Previously seen distal radial and ulnar fractures are again identified. The posterior displacement has been significantly reduced although mild posterior angulation remains. No other focal abnormality is seen IMPRESSION: Reduction and casting of distal radial and ulnar fractures. Electronically Signed   By: Alcide Clever M.D.   On: 01/18/2019 21:39   DG Wrist Complete Left  Result Date:  01/18/2019 CLINICAL DATA:  Fall EXAM: LEFT HAND - COMPLETE 3+ VIEW; LEFT WRIST - COMPLETE 3+ VIEW COMPARISON:  None. FINDINGS: There are fractures of the distal left radius and ulna with dorsal angulation and mild displacement. There are marked changes of osteoarthritis at the first carpometacarpal joint. Less pronounced changes of osteoarthritis are present at the triscaphe and distal interphalangeal joints. IMPRESSION: Acute fractures of the distal left radius and ulna with dorsal angulation and mild displacement. Electronically Signed   By: Guadlupe Spanish M.D.   On: 01/18/2019 13:35   CT Head Wo Contrast  Result Date: 01/18/2019 CLINICAL DATA:  Recent fall with headaches and neck pain, initial encounter EXAM: CT HEAD WITHOUT CONTRAST CT CERVICAL SPINE WITHOUT CONTRAST TECHNIQUE: Multidetector CT imaging of the head and cervical spine was performed following the standard protocol without intravenous contrast. Multiplanar CT image reconstructions of the cervical spine were also generated. COMPARISON:  None. FINDINGS: CT HEAD FINDINGS Brain: No evidence of acute infarction, hemorrhage, hydrocephalus, extra-axial collection or mass lesion/mass effect. Mild chronic white matter ischemic changes are seen. 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 Vascular: No hyperdense vessel or unexpected calcification. Skull: Normal. Negative for fracture or focal lesion. Sinuses/Orbits: No acute finding. Other: None. CT CERVICAL SPINE FINDINGS Alignment: Within normal limits. Skull base and vertebrae: 7 cervical segments are well visualized. Vertebral body height is well maintained. Multilevel facet hypertrophic changes are seen. No acute fracture or acute facet abnormality is noted. Soft tissues and spinal canal: Surrounding soft tissue structures are within normal limits. Upper chest: Visualized lung apices are within normal  limits. Other: None IMPRESSION: CT of the head: Chronic ischemic changes without acute abnormality. CT  of the cervical spine: Multilevel degenerative change without acute abnormality. Electronically Signed   By: Inez Catalina M.D.   On: 01/18/2019 17:06   CT Cervical Spine Wo Contrast  Result Date: 01/18/2019 CLINICAL DATA:  Recent fall with headaches and neck pain, initial encounter EXAM: CT HEAD WITHOUT CONTRAST CT CERVICAL SPINE WITHOUT CONTRAST TECHNIQUE: Multidetector CT imaging of the head and cervical spine was performed following the standard protocol without intravenous contrast. Multiplanar CT image reconstructions of the cervical spine were also generated. COMPARISON:  None. FINDINGS: CT HEAD FINDINGS Brain: No evidence of acute infarction, hemorrhage, hydrocephalus, extra-axial collection or mass lesion/mass effect. Mild chronic white matter ischemic changes are seen. 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 Vascular: No hyperdense vessel or unexpected calcification. Skull: Normal. Negative for fracture or focal lesion. Sinuses/Orbits: No acute finding. Other: None. CT CERVICAL SPINE FINDINGS Alignment: Within normal limits. Skull base and vertebrae: 7 cervical segments are well visualized. Vertebral body height is well maintained. Multilevel facet hypertrophic changes are seen. No acute fracture or acute facet abnormality is noted. Soft tissues and spinal canal: Surrounding soft tissue structures are within normal limits. Upper chest: Visualized lung apices are within normal limits. Other: None IMPRESSION: CT of the head: Chronic ischemic changes without acute abnormality. CT of the cervical spine: Multilevel degenerative change without acute abnormality. Electronically Signed   By: Inez Catalina M.D.   On: 01/18/2019 17:06   DG Hand Complete Left  Result Date: 01/18/2019 CLINICAL DATA:  Fall EXAM: LEFT HAND - COMPLETE 3+ VIEW; LEFT WRIST - COMPLETE 3+ VIEW COMPARISON:  None. FINDINGS: There are fractures of the distal left radius and ulna with dorsal angulation and mild displacement. There are  marked changes of osteoarthritis at the first carpometacarpal joint. Less pronounced changes of osteoarthritis are present at the triscaphe and distal interphalangeal joints. IMPRESSION: Acute fractures of the distal left radius and ulna with dorsal angulation and mild displacement. Electronically Signed   By: Macy Mis M.D.   On: 01/18/2019 13:35     Assessment & Plan    1. Elevated Troponin/CAD - she has a history of CABG x5 in 2000 with cath in 02/2017 showing 2/5 patent grafts with patent LIMA-LAD, patent SVG-distal RCA, occluded SVG-D1 and occluded seq-SVG-OM1-OM2 with medical management recommended, repeat cath in 11/2018 showing stable anatomy with patent LIMA-LAD and patent SVG-dRCA with 50% mid-graft stenosis and medical therapy recommended. -She is currently admitted with an acute GIB and Hgb at 6.7 this AM. Additional labs showed Troponin 195 with repeat of 4940. EKG on admission showed LVH and diffuse ST depression along lateral leads which is similar to prior tracings but more prominent. Repeat this AM shows worsening ST depression along the inferior and lateral leads with slight ST elevation along V1 and V2 with considerable elevation along AVR.  - thankfully, she denies any recent chest pain and her respiratory status has been stable. Suspect her enzyme elevation is a Type 2 NSTEMI in the setting of her known CAD and acute anemia. Not a candidate for Heparin given her GIB. Agree with holding ASA and Plavix (was on both for medical management but no recent intervention within the past 12 months). Continue BB, Imdur, and statin. Would obtain a repeat limited echo to reassess EF.   2. Chronic Combined Systolic and Diastolic CHF - Most recent echocardiogram in 11/2018 showed her EF was reduced at 30%.  She appears euvolemic by examination today. Would recommend a repeat limited echocardiogram as outlined above. She was on Toprol-XL, Hydralazine and Imdur following admission.  By review of  records she had been on Lisinopril in the past but did not tolerate this well.  If EF remains reduced, would recommend consideration of low-dose ARB or Entresto if BP allows.   3. HTN - BP has been variable at 103/67 to 169/96 while in the ED. She has been continued on PTA Toprol-XL 12.5 mg daily, Imdur 50 mg daily and  Hydralazine 25 mg 3 times daily.  4. HLD - FLP in 11/2018 showed total cholesterol 198, triglycerides 114, HDL 77 and LDL 98.  Low-dose Crestor 5 mg daily given intolerance to high intensity statins in the past.  6. Anemia/GIB - Hgb 14.5 in 11/2018, at 9.2 on admission and at 6.7 today. Receiving transfusion at this time.  In talking with nursing staff, GI is following with anticipation of the EGD later today if cleared from a cardiac perspective.  Given her elevated enzymes, known CAD and reduced EF, she would be of a higher risk for procedures but EGD is overall low-risk from a cardiac perspective. Will discuss with Dr. Eden Emms for full recommendations.   7. Left Radius and Ulna Fracture - underwent reduction while in the ED. Cast in place. Planning to follow with Ortho as an outpatient.    For questions or updates, please contact [facility] Please consult [email or web address] for contact info under Cardiology/STEMI.  Signed, Ellsworth Lennox, PA-C 01/19/2019, 11:13 AM Pager: [phone]  Patient examined chart reviewed Discussed care with patient , primary and PA. Patient admitted with GI bleed with Hb down to 6.7. Known CAD with previous CABG 20 years ago Last cath 11/20 With 2/5 grafts patent SVG to RCA and LIMA to LAD. Complex native bifurcation disease distal RCA with collaterals. No angina or active CHF Elevated troponin in setting of profound anemia Likely demand and decreased oxygen carrying capacity. Transfuse to Hb 10 with lasix. Ok to hold ASA/Plavix. Ok to proceed with EGD but colonoscopy would need to wait 4-6 weeks If she Does not have good response to  transfusion or more active bleeding would need bleeding scan or virtual colonoscopy with CT.  She is not a candidate for ischemic evaluation at this time due to anemia And bleeding EF known to be 30% so make sure I/O's stay even with extra lasix with any transfusion   Charlton Haws MD [facility]

## 2019-01-19 NOTE — Progress Notes (Signed)
PROGRESS NOTE [facility]   Susan [name]  QTM:[id number]  DOB: Apr 21, 1945  DOA: 01/18/2019 PCP: [name], MD   Brief Admission Hx: 74 y.o. female, with history of CHF EF 30%, CAD, CKD stage III, COPD, hypertension who came to ED with complaints of generalized weakness for past 2 days.  She has been taking aspirin and Plavix since November 2020 after she was diagnosed with NSTEMI and is being treated medically by cardiology.  She was found to have a hemoglobin of 6.7 on admission and was hemoccult positive.    MDM/Assessment & Plan:   1. GI bleed -patient has had a marked decline in her hemoglobin from 14.2 in November 2020 now down to 6.7.  She has been typed and screened and transfused 2 units PRBC.  She was seen by GI and they have recommended upper endoscopy.  Because of the complexity of her medical issues and no cardiology services available at this hospital for the next 2 days she will need to transfer to [facility] to have the endoscopy done under anesthesia care.  I called and spoke with [facility] Jennye Moccasin, PA-C who agreed to consult on the patient when she arrives at [facility].  Patient is currently receiving her last unit of PRBCs and hemodynamically stable for telemetry bed. 2. Acute on chronic blood loss anemia - Transfusing PRBC as noted above.  3. CAD s/p CABG x5 -the patient had a cath in November 2020 with findings of severe three-vessel coronary artery disease including chronic total occlusion of mid LAD and mid circumflex and proximal RCA.  She has been seen by the inpatient cardiology service and they have cleared her to have upper endoscopy at this time. 4. Elevated troponin-suspect this is secondary to demand ischemia related to severe anemia.  Do not believe this is ACS.  Patient has been seen by the inpatient cardiology service and they have ordered a 2D echocardiogram. 5. Chronic combined CHF-Lasix currently is held temporarily given elevated BUN.  Will  give Lasix 20 mg IV after blood transfusion. 6. Essential hypertension-she has been resumed on home blood pressure medications and her BPs are fairly well controlled we will continue to monitor. 7. Left radius and ulnar fractures-status post reduction and casting of the distal radius and ulnar fractures done in the emergency department.  The patient will need outpatient orthopedics follow-up after discharge.  DVT prophylaxis: SCD Code Status: Full Family Communication: Husband Disposition Plan: Transfer to [facility] for higher level of care   Consultants:  GI  cardiology  Procedures:  pending  Antimicrobials:     Subjective: Pt reports that she has no chest pain and no shortness of breath.  Her weakness is improving with blood transfusion no black stools reported  Objective: Vitals:   01/19/19 1330 01/19/19 1348 01/19/19 1400 01/19/19 1403  BP: (!) 167/69 (!) 174/75 (!) 183/70 (!) 174/84  Pulse: (!) 109 (!) 104 (!) 109 (!) 109  Resp: (!) 21 (!) 22 20 14   Temp:  98.2 F (36.8 C)  98.4 F (36.9 C)  TempSrc:  Oral  Oral  SpO2: 98%  99%   Weight:      Height:        Intake/Output Summary (Last 24 hours) at 01/19/2019 1705 Last data filed at 01/19/2019 1403 Gross per 24 hour  Intake 345 ml  Output --  Net 345 ml   Filed Weights   01/18/19 1251  Weight: 39 kg     REVIEW OF  SYSTEMS  As per history otherwise all reviewed and reported negative  Exam:  General exam: Elderly pale chronically ill-appearing female sitting up in the bed she is awake and alert in no apparent distress. Respiratory system: Clear. No increased work of breathing. Cardiovascular system: Normal S1 & S2 heard.  Gastrointestinal system: Abdomen is nondistended, soft and nontender. Normal bowel sounds heard. Central nervous system: Alert and oriented. No focal neurological deficits. Extremities: no CCE.  Data Reviewed: Basic Metabolic Panel: Recent Labs  Lab 01/18/19 1450 01/19/19 0335   NA 135 134*  K 3.9 3.9  CL 102 103  CO2 23 22  GLUCOSE 117* 138*  BUN 64* 53*  CREATININE 1.09* 1.11*  CALCIUM 8.6* 8.0*   Liver Function Tests: Recent Labs  Lab 01/18/19 1450 01/19/19 0335  AST 20 57*  ALT 15 16  ALKPHOS 46 36*  BILITOT 0.6 0.6  PROT 6.0* 5.0*  ALBUMIN 3.2* 2.6*   No results for input(s): LIPASE, AMYLASE in the last 168 hours. No results for input(s): AMMONIA in the last 168 hours. CBC: Recent Labs  Lab 01/18/19 1450 01/19/19 0335  WBC 12.4* 10.3  HGB 9.2* 6.7*  HCT 27.8* 20.7*  MCV 100.0 101.5*  PLT 238 180   Cardiac Enzymes: No results for input(s): CKTOTAL, CKMB, CKMBINDEX, TROPONINI in the last 168 hours. CBG (last 3)  Recent Labs    01/18/19 1258  GLUCAP 153*   Recent Results (from the past 240 hour(s))  SARS CORONAVIRUS 2 (TAT 6-24 HRS) Nasopharyngeal Nasopharyngeal Swab     Status: None   Collection Time: 01/18/19  5:25 PM   Specimen: Nasopharyngeal Swab  Result Value Ref Range Status   SARS Coronavirus 2 NEGATIVE NEGATIVE Final    Comment: (NOTE) SARS-CoV-2 target nucleic acids are NOT DETECTED. The SARS-CoV-2 RNA is generally detectable in upper and lower respiratory specimens during the acute phase of infection. Negative results do not preclude SARS-CoV-2 infection, do not rule out co-infections with other pathogens, and should not be used as the sole basis for treatment or other patient management decisions. Negative results must be combined with clinical observations, patient history, and epidemiological information. The expected result is Negative. Fact Sheet for Patients: HairSlick.no Fact Sheet for Healthcare Providers: quierodirigir.com This test is not yet approved or cleared by the [place] FDA and  has been authorized for detection and/or diagnosis of SARS-CoV-2 by FDA under an Emergency Use Authorization (EUA). This EUA will remain  in effect (meaning this  test can be used) for the duration of the COVID-19 declaration under Section 56 4(b)(1) of the Act, 21 U.S.C. section 360bbb-3(b)(1), unless the authorization is terminated or revoked sooner. Performed at [facility], [address] [place], [place] [address]      Studies: DG Chest 2 View  Result Date: 01/18/2019 CLINICAL DATA:  74 year old female with history of cough. EXAM: CHEST - 2 VIEW COMPARISON:  Chest x-ray 01/17/2019. FINDINGS: Lung volumes are increased with emphysematous changes. No acute consolidative airspace disease. No pleural effusions. No pneumothorax. No suspicious appearing pulmonary nodules or masses. No evidence of pulmonary edema. Heart size is mildly enlarged. Upper mediastinal contours are within normal limits. Aortic atherosclerosis. Status post median sternotomy for CABG including LIMA. Surgical clips projecting over the right hemithorax, presumably in the overlying breast. IMPRESSION: 1. No radiographic evidence of acute cardiopulmonary disease. 2. Emphysema. 3. Mild cardiomegaly. 4. Aortic atherosclerosis. Electronically Signed   By: Trudie Reed M.D.   On: 01/18/2019 16:10  DG Wrist 2 Views Left  Result Date: 01/18/2019 CLINICAL DATA:  Status post reduction EXAM: LEFT WRIST - 2 VIEW COMPARISON:  01/18/2019 FINDINGS: Casting material is now seen. Previously seen distal radial and ulnar fractures are again identified. The posterior displacement has been significantly reduced although mild posterior angulation remains. No other focal abnormality is seen IMPRESSION: Reduction and casting of distal radial and ulnar fractures. Electronically Signed   By: Inez Catalina M.D.   On: 01/18/2019 21:39   DG Wrist Complete Left  Result Date: 01/18/2019 CLINICAL DATA:  Fall EXAM: LEFT HAND - COMPLETE 3+ VIEW; LEFT WRIST - COMPLETE 3+ VIEW COMPARISON:  None. FINDINGS: There are fractures of the distal left radius and ulna with dorsal angulation and mild displacement. There  are marked changes of osteoarthritis at the first carpometacarpal joint. Less pronounced changes of osteoarthritis are present at the triscaphe and distal interphalangeal joints. IMPRESSION: Acute fractures of the distal left radius and ulna with dorsal angulation and mild displacement. Electronically Signed   By: Macy Mis M.D.   On: 01/18/2019 13:35   CT Head Wo Contrast  Result Date: 01/18/2019 CLINICAL DATA:  Recent fall with headaches and neck pain, initial encounter EXAM: CT HEAD WITHOUT CONTRAST CT CERVICAL SPINE WITHOUT CONTRAST TECHNIQUE: Multidetector CT imaging of the head and cervical spine was performed following the standard protocol without intravenous contrast. Multiplanar CT image reconstructions of the cervical spine were also generated. COMPARISON:  None. FINDINGS: CT HEAD FINDINGS Brain: No evidence of acute infarction, hemorrhage, hydrocephalus, extra-axial collection or mass lesion/mass effect. Mild chronic white matter ischemic changes are seen. 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 Vascular: No hyperdense vessel or unexpected calcification. Skull: Normal. Negative for fracture or focal lesion. Sinuses/Orbits: No acute finding. Other: None. CT CERVICAL SPINE FINDINGS Alignment: Within normal limits. Skull base and vertebrae: 7 cervical segments are well visualized. Vertebral body height is well maintained. Multilevel facet hypertrophic changes are seen. No acute fracture or acute facet abnormality is noted. Soft tissues and spinal canal: Surrounding soft tissue structures are within normal limits. Upper chest: Visualized lung apices are within normal limits. Other: None IMPRESSION: CT of the head: Chronic ischemic changes without acute abnormality. CT of the cervical spine: Multilevel degenerative change without acute abnormality. Electronically Signed   By: Inez Catalina M.D.   On: 01/18/2019 17:06   CT Cervical Spine Wo Contrast  Result Date: 01/18/2019 CLINICAL DATA:  Recent  fall with headaches and neck pain, initial encounter EXAM: CT HEAD WITHOUT CONTRAST CT CERVICAL SPINE WITHOUT CONTRAST TECHNIQUE: Multidetector CT imaging of the head and cervical spine was performed following the standard protocol without intravenous contrast. Multiplanar CT image reconstructions of the cervical spine were also generated. COMPARISON:  None. FINDINGS: CT HEAD FINDINGS Brain: No evidence of acute infarction, hemorrhage, hydrocephalus, extra-axial collection or mass lesion/mass effect. Mild chronic white matter ischemic changes are seen. 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 Vascular: No hyperdense vessel or unexpected calcification. Skull: Normal. Negative for fracture or focal lesion. Sinuses/Orbits: No acute finding. Other: None. CT CERVICAL SPINE FINDINGS Alignment: Within normal limits. Skull base and vertebrae: 7 cervical segments are well visualized. Vertebral body height is well maintained. Multilevel facet hypertrophic changes are seen. No acute fracture or acute facet abnormality is noted. Soft tissues and spinal canal: Surrounding soft tissue structures are within normal limits. Upper chest: Visualized lung apices are within normal limits. Other: None IMPRESSION: CT of the head: Chronic ischemic changes without acute abnormality.  CT of the cervical spine: Multilevel degenerative change without acute abnormality. Electronically Signed   By: Alcide Clever M.D.   On: 01/18/2019 17:06   DG Hand Complete Left  Result Date: 01/18/2019 CLINICAL DATA:  Fall EXAM: LEFT HAND - COMPLETE 3+ VIEW; LEFT WRIST - COMPLETE 3+ VIEW COMPARISON:  None. FINDINGS: There are fractures of the distal left radius and ulna with dorsal angulation and mild displacement. There are marked changes of osteoarthritis at the first carpometacarpal joint. Less pronounced changes of osteoarthritis are present at the triscaphe and distal interphalangeal joints. IMPRESSION: Acute fractures of the distal left radius and  ulna with dorsal angulation and mild displacement. Electronically Signed   By: Guadlupe Spanish M.D.   On: 01/18/2019 13:35   ECHOCARDIOGRAM LIMITED  Result Date: 01/19/2019   ECHOCARDIOGRAM LIMITED REPORT   Patient Name:   Susan [name] Date of Exam: 01/19/2019 Medical Rec #:  [id number]      Height:       60.0 in Accession #:    [id number]     Weight:       86.0 lb Date of Birth:  1945/06/19       BSA:          1.30 m Patient Age:    73 years       BP:           159/82 mmHg Patient Gender: F              HR:           105 bpm. [facility]  Procedure: 2D Echo, Cardiac Doppler and Color Doppler Indications:    NSTEMI / Limited Study eval LVEF and WMA  History:        Patient has prior history of Echocardiogram examinations, most                 recent 11/19/2018. Smoker.  Sonographer:    Celesta Gentile RCS Referring Phys: [id number] Ellsworth Lennox IMPRESSIONS  1. Left ventricular ejection fraction, by visual estimation, is 25 to 30%. The left ventricle has severely decreased function. Left ventricular septal wall thickness was normal.  2. Left ventricular diastolic function could not be evaluated.  3. Severely dilated left ventricular internal cavity size.  4. 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  5. Global right ventricle has normal systolc function.The right ventricular size is normal. no increase in right ventricular wall thickness.  6. Left atrial size was mildly dilated.  7. The tricuspid valve was not assessed.  8. Mild aortic valve sclerosis without stenosis.  9. Aortic root could not be assessed. 10. ? prominent chiarri network at dome of RA in 4 chamber view. FINDINGS  Left Ventricle: Left ventricular ejection fraction, by visual estimation, is 25 to 30%. The left ventricle has severely decreased function. Severe hypokinesis of the left ventricular, apical septal wall,  anteroseptal wall and inferoseptal wall. Severe hypokinesis of the left ventricular, entire inferior wall. The left ventricular internal cavity size was severely dilated left ventricle. Left ventricular septal wall thickness was normal. Left ventricular diastolic function could not be evaluated. 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 Right Ventricle: The right ventricular size is normal. No increase in right ventricular wall thickness. Global RV systolic function is has normal systolic function.  Left Atrium: Left atrial size was mildly dilated. Right Atrium: Right atrial size was normal in size. Right atrial pressure is estimated at 10 mmHg. ? prominent chiarri network at dome of RA in 4 chamber view. Pericardium: There is no evidence of pericardial effusion is seen. There is no evidence of pericardial effusion. Tricuspid Valve: The tricuspid valve is not assessed. Aortic Valve: The aortic valve is tricuspid. Mild aortic valve sclerosis is present, with no evidence of aortic valve stenosis. Pulmonic Valve: The pulmonic valve was not assessed. Aorta: Aortic root could not be assessed. Shunts: No atrial level shunt detected by color flow Doppler.  LEFT VENTRICLE          Normals PLAX 2D LVIDd:         4.85 cm  3.6 cm LVIDs:         4.40 cm  1.7 cm LV PW:         0.81 cm  1.4 cm LV IVS:        1.01 cm  1.3 cm LVOT diam:     1.70 cm  2.0 cm LV SV:         22 ml    79 ml LV SV Index:   17.59    45 ml/m2 LVOT Area:     2.27 cm 3.14 cm2  LV Volumes (MOD)             Normals LV area d, A2C:    26.20 cm LV area d, A4C:    31.00 cm LV area s, A2C:    22.10 cm LV area s, A4C:    26.30 cm LV major d, A2C:   7.15 cm LV major d, A4C:   7.32 cm LV major s, A2C:   6.60 cm LV major s, A4C:   6.85 cm LV vol d, MOD A2C: 81.4 ml   68 ml LV vol d, MOD A4C: 108.0 ml LV vol s, MOD A2C: 60.5 ml   24 ml LV vol s, MOD A4C:  84.3 ml LV SV MOD A2C:     20.9 ml LV SV MOD A4C:     108.0 ml LV SV MOD BP:      21.9 ml   45 ml LEFT ATRIUM         Index LA diam:    3.30 cm 2.53 cm/m   AORTA                 Normals Ao Root diam: 2.80 cm 31 mm  SHUNTS Systemic Diam: 1.70 cm  Charlton Haws MD Electronically signed by Charlton Haws MD Signature Date/Time: 01/19/2019/2:10:34 PM    Final    Scheduled Meds: . sodium chloride   Intravenous Once  . sodium chloride   Intravenous Once  . hydrALAZINE  25 mg Oral TID  . isosorbide mononitrate  15 mg Oral Daily  . metoprolol succinate  12.5 mg Oral Daily  . rosuvastatin  5 mg Oral Daily  . sodium chloride flush  3 mL Intravenous Q12H   Continuous Infusions: . sodium chloride    . sodium chloride    . pantoprozole (PROTONIX) infusion      Active Problems:   SEMI (subendocardial myocardial infarction) (HCC)   GI bleed   Acute upper GI bleed  Time spent:   Standley Dakins, MD [facility] 01/19/2019, 5:05 PM    LOS: 1 day  How to contact the [facility] Attending or Consulting provider 7A - 7P or covering provider during  after hours 7P -7A, for this patient?  1. Check the care team in [facility] and look for a) attending/consulting [facility] provider listed and b) the [facility] team listed 2. Log into [email or web address] and use [facility]'s universal password to access. If you do not have the password, please contact the hospital operator. 3. Locate the [facility] provider you are looking for under [facility] and page to a number that you can be directly reached. 4. If you still have difficulty reaching the provider, please page the [facility] (Director on Call) for the Hospitalists listed on amion for assistance.

## 2019-01-19 NOTE — ED Notes (Signed)
Cardiology messaged regarding plan of care for pt d/t elevated trop, pt pain free at this time on the cardiac monitor

## 2019-01-19 NOTE — ED Notes (Signed)
CRITICAL VALUE ALERT  Critical Value: Troponin 4940  Date & Time Notied: 01/19/19 6314  Provider Notified: Dr. Sharl Ma  Orders Received/Actions taken: n/a

## 2019-01-19 NOTE — Progress Notes (Signed)
Spoke with ER nursing staff. Patient received 75% of her first unit of blood and there was a pump malfunction. A second unit has been ordered, approximate ETA on starting the second unit sometime before 1:30 pm.   Thank you for allowing [place] to participate in the care of [facility]  Wynne Dust, DNP, AGNP-C Adult & Gerontological Nurse Practitioner [facility]

## 2019-01-19 NOTE — ED Notes (Addendum)
This RN attempted IV access unsuccessfully, the pts blood paused d/t difficulty with IV, new dressing placed on existing line

## 2019-01-19 NOTE — ED Notes (Signed)
Lab to collect trop, pt receiving blood, will collect once blood infused

## 2019-01-19 NOTE — ED Notes (Signed)
Blood completed at 1730.  No reaction noted.

## 2019-01-19 NOTE — ED Notes (Signed)
Per Minerva Areola, NP, pt will get her endoscopy sometime later afternoon

## 2019-01-19 NOTE — ED Notes (Signed)
CRITICAL VALUE ALERT  Critical Value:  Hgb 6.7  Date & Time Notied:  01/19/2019 0415  Provider Notified: Dr. Sharl Ma via Amion  Orders Received/Actions taken: n/a

## 2019-01-20 DIAGNOSIS — I5020 Unspecified systolic (congestive) heart failure: Secondary | ICD-10-CM

## 2019-01-20 DIAGNOSIS — I248 Other forms of acute ischemic heart disease: Secondary | ICD-10-CM

## 2019-01-20 DIAGNOSIS — K922 Gastrointestinal hemorrhage, unspecified: Secondary | ICD-10-CM

## 2019-01-20 LAB — BPAM RBC
Blood Product Expiration Date: 202101292359
Blood Product Expiration Date: 202101292359
ISSUE DATE / TIME: 202101080520
ISSUE DATE / TIME: 202101081334
Unit Type and Rh: 600
Unit Type and Rh: 600

## 2019-01-20 LAB — TYPE AND SCREEN
ABO/RH(D): A NEG
ABO/RH(D): A NEG
Antibody Screen: NEGATIVE
Antibody Screen: NEGATIVE
Unit division: 0
Unit division: 0

## 2019-01-20 LAB — CBC
HCT: 27 % — ABNORMAL LOW (ref 36.0–46.0)
HCT: 25.9 % — ABNORMAL LOW (ref 36.0–46.0)
Hemoglobin: 9.1 g/dL — ABNORMAL LOW (ref 12.0–15.0)
Hemoglobin: 8.6 g/dL — ABNORMAL LOW (ref 12.0–15.0)
MCH: 30.6 pg (ref 26.0–34.0)
MCH: 30.6 pg (ref 26.0–34.0)
MCHC: 33.7 g/dL (ref 30.0–36.0)
MCHC: 33.2 g/dL (ref 30.0–36.0)
MCV: 90.9 fL (ref 80.0–100.0)
MCV: 92.2 fL (ref 80.0–100.0)
Platelets: 143 10*3/uL — ABNORMAL LOW (ref 150–400)
Platelets: 138 10*3/uL — ABNORMAL LOW (ref 150–400)
RBC: 2.97 MIL/uL — ABNORMAL LOW (ref 3.87–5.11)
RBC: 2.81 MIL/uL — ABNORMAL LOW (ref 3.87–5.11)
RDW: 18 % — ABNORMAL HIGH (ref 11.5–15.5)
RDW: 17.8 % — ABNORMAL HIGH (ref 11.5–15.5)
WBC: 10.7 10*3/uL — ABNORMAL HIGH (ref 4.0–10.5)
WBC: 11 10*3/uL — ABNORMAL HIGH (ref 4.0–10.5)
nRBC: 0.2 % (ref 0.0–0.2)
nRBC: 0.5 % — ABNORMAL HIGH (ref 0.0–0.2)

## 2019-01-20 LAB — HEPATIC FUNCTION PANEL
ALT: 29 U/L (ref 0–44)
AST: 148 U/L — ABNORMAL HIGH (ref 15–41)
Albumin: 2.6 g/dL — ABNORMAL LOW (ref 3.5–5.0)
Alkaline Phosphatase: 39 U/L (ref 38–126)
Bilirubin, Direct: 0.2 mg/dL (ref 0.0–0.2)
Indirect Bilirubin: 0.6 mg/dL (ref 0.3–0.9)
Total Bilirubin: 0.8 mg/dL (ref 0.3–1.2)
Total Protein: 5.1 g/dL — ABNORMAL LOW (ref 6.5–8.1)

## 2019-01-20 LAB — BASIC METABOLIC PANEL
Anion gap: 10 (ref 5–15)
BUN: 32 mg/dL — ABNORMAL HIGH (ref 8–23)
CO2: 27 mmol/L (ref 22–32)
Calcium: 8.2 mg/dL — ABNORMAL LOW (ref 8.9–10.3)
Chloride: 97 mmol/L — ABNORMAL LOW (ref 98–111)
Creatinine, Ser: 1.31 mg/dL — ABNORMAL HIGH (ref 0.44–1.00)
GFR calc Af Amer: 46 mL/min — ABNORMAL LOW (ref >60)
GFR calc non Af Amer: 40 mL/min — ABNORMAL LOW (ref >60)
Glucose, Bld: 141 mg/dL — ABNORMAL HIGH (ref 70–99)
Potassium: 3.6 mmol/L (ref 3.5–5.1)
Sodium: 134 mmol/L — ABNORMAL LOW (ref 135–145)

## 2019-01-20 LAB — ABO/RH: ABO/RH(D): A NEG

## 2019-01-20 MED ORDER — FUROSEMIDE 40 MG PO TABS
40.0000 mg | ORAL_TABLET | Freq: Every day | ORAL | Status: DC
  Administered 2019-01-21 – 2019-01-24 (×3): 40 mg via ORAL
  Filled 2019-01-20 (×4): qty 1

## 2019-01-20 MED ORDER — PANTOPRAZOLE SODIUM 40 MG IV SOLR
40.0000 mg | Freq: Two times a day (BID) | INTRAVENOUS | Status: DC
  Administered 2019-01-20 – 2019-01-22 (×4): 40 mg via INTRAVENOUS
  Filled 2019-01-20 (×4): qty 40

## 2019-01-20 MED ORDER — ACETAMINOPHEN 325 MG PO TABS
650.0000 mg | ORAL_TABLET | Freq: Four times a day (QID) | ORAL | Status: DC
  Administered 2019-01-20 – 2019-01-24 (×11): 650 mg via ORAL
  Filled 2019-01-20 (×12): qty 2

## 2019-01-20 NOTE — Progress Notes (Addendum)
PROGRESS NOTE    Susan [name]  VHQ:[id number] DOB: 30-Oct-1945 DOA: 01/18/2019 PCP: [name], MD   Brief Narrative:   74 year old woman with history of coronary artery disease status post CABG on dual antiplatelet therapy presenting with acute anemia likely secondary to GI bleed.  The patient initially presented to [facility] with fatigue and weakness over the past month.  She endorses some black stools and had a positive FOBT.  She is transferred to [facility] for further evaluation with gastroenterology here.  Cardiology was also consulted.   Assessment & Plan:   Active Problems:   Hx of CABG   SEMI (subendocardial myocardial infarction) (HCC)   GI bleed   Acute upper GI bleed   Acute anemia, likely secondary to GI bleed in the setting of dual antiplatelet use.  Received 2 units of packed red blood cells for hemoglobin 6.7. No melena or hematochezia, continue to monitor. Type and hold ordered here.  - Hemoglobin now 9. - 5 PM CBC.  - Consult to GI, spoke with Dr. Marina Goodell who will see patient. Recommended transitioning from PPI drip to IV BID PPI.  -Clear diet per GI.   History of CAD, elevated troponin, denies chest pain this morning.  -Continue to hold aspirin and Plavix in setting of GI bleed.  Monitor for symptoms of ischemia. -Continue Imdur, metoprolol and Crestor.  Continue hydralazine 3 times daily.  Restarted p.o. diuretic.  Heart failure with reduced ejection fraction -Restart furosemide daily.  Monitor fluid status closely as she is received 2 units of packed red blood cells. - Echocardiogram with hypokinesis globally, similar to prior  - Appreciate Cardiology recommendations   Distal radius and ulnar facture, she will need follow-up with orthopedics.  Standing Tylenol ordered.  Oxycodone for severe pain. Consider bisphosphonate as outpatient.   CKD II, monitor creatinine, avoid nephrotoxic agents. Baseline appears to be ~1.10.   Elevated AST, possibly  due to GI bleed, demand ischemia. No abdominal pain this AM. Monitor.   DVT prophylaxis: SCDs   Code Status: FULL Family Communication: Discussed with patient Disposition Plan: Likely inpatient 1-2 more days pending GI evaluation     Consultants:   Gastroenterology (spoke with Dr. Marina Goodell early on 1/9, he will see)  Cardiology   Procedures:   None  Antimicrobials:   None    Subjective: Patient reports she feels okay. Denies chest pain, dyspnea this AM. No melena, no BM today.    Objective: Vitals:   01/19/19 2350 01/20/19 0456 01/20/19 0948 01/20/19 1643  BP: (!) 159/74 (!) 146/93 109/77 (!) 113/102  Pulse: 92 87    Resp: (!) 23 19 17  (!) 28  Temp: 97.8 F (36.6 C) 98 F (36.7 C)    TempSrc: Oral Oral    SpO2: 95% 95%    Weight:      Height:        Intake/Output Summary (Last 24 hours) at 01/20/2019 1949 Last data filed at 01/20/2019 1739 Gross per 24 hour  Intake 1481.8 ml  Output --  Net 1481.8 ml   Filed Weights   01/18/19 1251  Weight: 39 kg    Examination:  General exam: Appears calm and comfortable  Respiratory system: Clear to auscultation. Respiratory effort normal. Cardiovascular system: S1 & S2 heard, RRR. Sternotomy scar in place.  Gastrointestinal system: Abdomen is nondistended, soft and nontender. No organomegaly or masses felt. Normal bowel sounds heard. Central nervous system: Alert and oriented to place. Uncertain of day. No focal neurological deficits.  Skin: No rashes, lesions or ulcers Psychiatry: Judgement and insight appear normal. Mood & affect appropriate.  EXT: Left UE in case, warm, well perfused, normal sensation of fingers     Data Reviewed: I have personally reviewed following labs and imaging studies  CBC: Recent Labs  Lab 01/18/19 1450 01/19/19 0335 01/20/19 0752 01/20/19 1816  WBC 12.4* 10.3 10.7* 11.0*  HGB 9.2* 6.7* 9.1* 8.6*  HCT 27.8* 20.7* 27.0* 25.9*  MCV 100.0 101.5* 90.9 92.2  PLT 238 180 143* 138*    Basic Metabolic Panel: Recent Labs  Lab 01/18/19 1450 01/19/19 0335 01/20/19 0752  NA 135 134* 134*  K 3.9 3.9 3.6  CL 102 103 97*  CO2 23 22 27   GLUCOSE 117* 138* 141*  BUN 64* 53* 32*  CREATININE 1.09* 1.11* 1.31*  CALCIUM 8.6* 8.0* 8.2*   GFR: Estimated Creatinine Clearance: 23.2 mL/min (A) (by C-G formula based on SCr of 1.31 mg/dL (H)). Liver Function Tests: Recent Labs  Lab 01/18/19 1450 01/19/19 0335 01/20/19 0752  AST 20 57* 148*  ALT 15 16 29   ALKPHOS 46 36* 39  BILITOT 0.6 0.6 0.8  PROT 6.0* 5.0* 5.1*  ALBUMIN 3.2* 2.6* 2.6*   CB  Recent Results (from the past 240 hour(s))  SARS CORONAVIRUS 2 (TAT 6-24 HRS) Nasopharyngeal Nasopharyngeal Swab     Status: None   Collection Time: 01/18/19  5:25 PM   Specimen: Nasopharyngeal Swab  Result Value Ref Range Status   SARS Coronavirus 2 NEGATIVE NEGATIVE Final    Comment: (NOTE) SARS-CoV-2 target nucleic acids are NOT DETECTED. The SARS-CoV-2 RNA is generally detectable in upper and lower respiratory specimens during the acute phase of infection. Negative results do not preclude SARS-CoV-2 infection, do not rule out co-infections with other pathogens, and should not be used as the sole basis for treatment or other patient management decisions. Negative results must be combined with clinical observations, patient history, and epidemiological information. The expected result is Negative. Fact Sheet for Patients: SugarRoll.be Fact Sheet for Healthcare Providers: https://www.woods-mathews.com/ This test is not yet approved or cleared by the [place] FDA and  has been authorized for detection and/or diagnosis of SARS-CoV-2 by FDA under an Emergency Use Authorization (EUA). This EUA will remain  in effect (meaning this test can be used) for the duration of the COVID-19 declaration under Section 56 4(b)(1) of the Act, 21 U.S.C. section 360bbb-3(b)(1), unless the  authorization is terminated or revoked sooner. Performed at [facility], [place] [address], [place], [place] [address]          Radiology Studies: DG Wrist 2 Views Left  Result Date: 01/18/2019 CLINICAL DATA:  Status post reduction EXAM: LEFT WRIST - 2 VIEW COMPARISON:  01/18/2019 FINDINGS: Casting material is now seen. Previously seen distal radial and ulnar fractures are again identified. The posterior displacement has been significantly reduced although mild posterior angulation remains. No other focal abnormality is seen IMPRESSION: Reduction and casting of distal radial and ulnar fractures. Electronically Signed   By: Inez Catalina M.D.   On: 01/18/2019 21:39   ECHOCARDIOGRAM LIMITED  Result Date: 01/19/2019   ECHOCARDIOGRAM LIMITED REPORT   Patient Name:   Susan [name] Date of Exam: 01/19/2019 Medical Rec #:  [id number]      Height:       60.0 in Accession #:    [id number]     Weight:       86.0 lb Date of Birth:  February 24, 1945  BSA:          1.30 m Patient Age:    73 years       BP:           159/82 mmHg Patient Gender: F              HR:           105 bpm. [facility]am [facility]  Procedure: 2D Echo, Cardiac Doppler and Color Doppler Indications:    NSTEMI / Limited Study eval LVEF and WMA  History:        Patient has prior history of Echocardiogram examinations, most                 recent 11/19/2018. Smoker.  Sonographer:    Celesta Gentile RCS Referring Phys: [id number] [place]sworth Lennox IMPRESSIONS  1. Left ventricular ejection fraction, by visual estimation, is 25 to 30%. The left ventricle has severely decreased function. Left ventricular septal wall thickness was normal.  2. Left ventricular diastolic function could not be evaluated.  3. Severely dilated left ventricular internal cavity size.  4. 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  5. Global right ventricle has normal  systolc function.The right ventricular size is normal. no increase in right ventricular wall thickness.  6. Left atrial size was mildly dilated.  7. The tricuspid valve was not assessed.  8. Mild aortic valve sclerosis without stenosis.  9. Aortic root could not be assessed. 10. ? prominent chiarri network at dome of RA in 4 chamber view. FINDINGS  Left Ventricle: Left ventricular ejection fraction, by visual estimation, is 25 to 30%. The left ventricle has severely decreased function. Severe hypokinesis of the left ventricular, apical septal wall, anteroseptal wall and inferoseptal wall. Severe hypokinesis of the left ventricular, entire inferior wall. The left ventricular internal cavity size was severely dilated left ventricle. Left ventricular septal wall thickness was normal. Left ventricular diastolic function could not be evaluated. 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 Right Ventricle: The right ventricular size is normal. No increase in right ventricular wall thickness. Global RV systolic function is has normal systolic function. Left Atrium: Left atrial size was mildly dilated. Right Atrium: Right atrial size was normal in size. Right atrial pressure is estimated at 10 mmHg. ? prominent chiarri network at dome of RA in 4 chamber view. Pericardium: There is no evidence of pericardial effusion is seen. There is no evidence of pericardial effusion. Tricuspid Valve: The tricuspid valve is not assessed. Aortic Valve: The aortic valve is tricuspid. Mild aortic valve sclerosis is present, with no evidence of aortic valve stenosis. Pulmonic Valve: The pulmonic valve was not assessed. Aorta: Aortic root could not be assessed. Shunts: No atrial level shunt detected by color flow Doppler.  LEFT VENTRICLE          Normals PLAX 2D LVIDd:         4.85 cm  3.6 cm LVIDs:         4.40 cm  1.7 cm LV PW:          0.81 cm  1.4 cm LV IVS:        1.01 cm  1.3 cm LVOT diam:     1.70 cm  2.0 cm LV SV:         22  ml    79 ml LV SV Index:   17.59    45 ml/m2 LVOT Area:     2.27 cm 3.14 cm2  LV Volumes (MOD)             Normals LV area d, A2C:    26.20 cm LV area d, A4C:    31.00 cm LV area s, A2C:    22.10 cm LV area s, A4C:    26.30 cm LV major d, A2C:   7.15 cm LV major d, A4C:   7.32 cm LV major s, A2C:   6.60 cm LV major s, A4C:   6.85 cm LV vol d, MOD A2C: 81.4 ml   68 ml LV vol d, MOD A4C: 108.0 ml LV vol s, MOD A2C: 60.5 ml   24 ml LV vol s, MOD A4C: 84.3 ml LV SV MOD A2C:     20.9 ml LV SV MOD A4C:     108.0 ml LV SV MOD BP:      21.9 ml   45 ml LEFT ATRIUM         Index LA diam:    3.30 cm 2.53 cm/m   AORTA                 Normals Ao Root diam: 2.80 cm 31 mm  SHUNTS Systemic Diam: 1.70 cm  Charlton Haws MD Electronically signed by Charlton Haws MD Signature Date/Time: 01/19/2019/2:10:34 PM    Final         Scheduled Meds: . sodium chloride   Intravenous Once  . sodium chloride   Intravenous Once  . acetaminophen  650 mg Oral Q6H  . furosemide  40 mg Oral Daily  . hydrALAZINE  25 mg Oral TID  . isosorbide mononitrate  15 mg Oral Daily  . metoprolol succinate  12.5 mg Oral Daily  . pantoprazole (PROTONIX) IV  40 mg Intravenous Q12H  . rosuvastatin  5 mg Oral Daily  . sodium chloride flush  3 mL Intravenous Q12H   Continuous Infusions: . sodium chloride    . sodium chloride 20 mL/hr at 01/20/19 1739     LOS: 2 days    Time spent: >35 minutes     Terisa Starr, MD   [facility] Pager [phone]  If 7PM-7AM, please contact night-coverage [email or web address] Password [facility] 01/20/2019, 7:49 PM

## 2019-01-20 NOTE — Progress Notes (Signed)
Progress Note  Patient Name: Maite Nutile Date of Encounter: 01/20/2019  Primary Cardiologist: Prentice Docker, MD   Subjective   "I feel better." Denies chest pain or sob.   Inpatient Medications    Scheduled Meds: . sodium chloride   Intravenous Once  . sodium chloride   Intravenous Once  . furosemide  40 mg Oral Daily  . hydrALAZINE  25 mg Oral TID  . isosorbide mononitrate  15 mg Oral Daily  . metoprolol succinate  12.5 mg Oral Daily  . rosuvastatin  5 mg Oral Daily  . sodium chloride flush  3 mL Intravenous Q12H   Continuous Infusions: . sodium chloride    . sodium chloride 20 mL/hr at 01/20/19 0827  . pantoprozole (PROTONIX) infusion     PRN Meds: sodium chloride, acetaminophen **OR** acetaminophen, nitroGLYCERIN, ondansetron **OR** ondansetron (ZOFRAN) IV, oxyCODONE, sodium chloride flush   Vital Signs    Vitals:   01/19/19 1843 01/19/19 1939 01/19/19 2350 01/20/19 0456  BP: (!) 179/86 (!) 181/78 (!) 159/74 (!) 146/93  Pulse: 95 96 92 87  Resp:  18 (!) 23 19  Temp: 98.7 F (37.1 C) 97.8 F (36.6 C) 97.8 F (36.6 C) 98 F (36.7 C)  TempSrc: Oral Oral Oral Oral  SpO2: 97% 96% 95% 95%  Weight:      Height:        Intake/Output Summary (Last 24 hours) at 01/20/2019 0840 Last data filed at 01/19/2019 2200 Gross per 24 hour  Intake 1433 ml  Output --  Net 1433 ml   Filed Weights   01/18/19 1251  Weight: 39 kg    Telemetry    nsr - Personally Reviewed  ECG    nsr with global ST depression - Personally Reviewed  Physical Exam   GEN: No acute distress.   Neck: No JVD Cardiac: RRR, no murmurs, rubs, or gallops.  Respiratory: Clear to auscultation bilaterally. GI: Soft, nontender, non-distended  MS: No edema; No deformity. Neuro:  Nonfocal  Psych: Normal affect   Labs    Chemistry Recent Labs  Lab 01/18/19 1450 01/19/19 0335  NA 135 134*  K 3.9 3.9  CL 102 103  CO2 23 22  GLUCOSE 117* 138*  BUN 64* 53*  CREATININE 1.09* 1.11*   CALCIUM 8.6* 8.0*  PROT 6.0* 5.0*  ALBUMIN 3.2* 2.6*  AST 20 57*  ALT 15 16  ALKPHOS 46 36*  BILITOT 0.6 0.6  GFRNONAA 50* 49*  GFRAA 58* 57*  ANIONGAP 10 9     Hematology Recent Labs  Lab 01/18/19 1450 01/19/19 0335  WBC 12.4* 10.3  RBC 2.78* 2.04*  HGB 9.2* 6.7*  HCT 27.8* 20.7*  MCV 100.0 101.5*  MCH 33.1 32.8  MCHC 33.1 32.4  RDW 14.4 14.3  PLT 238 180    Cardiac EnzymesNo results for input(s): TROPONINI in the last 168 hours. No results for input(s): TROPIPOC in the last 168 hours.   BNP Recent Labs  Lab 01/18/19 1450  BNP 496.0*     DDimer No results for input(s): DDIMER in the last 168 hours.   Radiology    DG Chest 2 View  Result Date: 01/18/2019 CLINICAL DATA:  74 year old female with history of cough. EXAM: CHEST - 2 VIEW COMPARISON:  Chest x-ray 01/17/2019. FINDINGS: Lung volumes are increased with emphysematous changes. No acute consolidative airspace disease. No pleural effusions. No pneumothorax. No suspicious appearing pulmonary nodules or masses. No evidence of pulmonary edema. Heart size is mildly enlarged. Upper mediastinal  contours are within normal limits. Aortic atherosclerosis. Status post median sternotomy for CABG including LIMA. Surgical clips projecting over the right hemithorax, presumably in the overlying breast. IMPRESSION: 1. No radiographic evidence of acute cardiopulmonary disease. 2. Emphysema. 3. Mild cardiomegaly. 4. Aortic atherosclerosis. Electronically Signed   By: Trudie Reed M.D.   On: 01/18/2019 16:10   DG Wrist 2 Views Left  Result Date: 01/18/2019 CLINICAL DATA:  Status post reduction EXAM: LEFT WRIST - 2 VIEW COMPARISON:  01/18/2019 FINDINGS: Casting material is now seen. Previously seen distal radial and ulnar fractures are again identified. The posterior displacement has been significantly reduced although mild posterior angulation remains. No other focal abnormality is seen IMPRESSION: Reduction and casting of distal  radial and ulnar fractures. Electronically Signed   By: Alcide Clever M.D.   On: 01/18/2019 21:39   DG Wrist Complete Left  Result Date: 01/18/2019 CLINICAL DATA:  Fall EXAM: LEFT HAND - COMPLETE 3+ VIEW; LEFT WRIST - COMPLETE 3+ VIEW COMPARISON:  None. FINDINGS: There are fractures of the distal left radius and ulna with dorsal angulation and mild displacement. There are marked changes of osteoarthritis at the first carpometacarpal joint. Less pronounced changes of osteoarthritis are present at the triscaphe and distal interphalangeal joints. IMPRESSION: Acute fractures of the distal left radius and ulna with dorsal angulation and mild displacement. Electronically Signed   By: Guadlupe Spanish M.D.   On: 01/18/2019 13:35   CT Head Wo Contrast  Result Date: 01/18/2019 CLINICAL DATA:  Recent fall with headaches and neck pain, initial encounter EXAM: CT HEAD WITHOUT CONTRAST CT CERVICAL SPINE WITHOUT CONTRAST TECHNIQUE: Multidetector CT imaging of the head and cervical spine was performed following the standard protocol without intravenous contrast. Multiplanar CT image reconstructions of the cervical spine were also generated. COMPARISON:  None. FINDINGS: CT HEAD FINDINGS Brain: No evidence of acute infarction, hemorrhage, hydrocephalus, extra-axial collection or mass lesion/mass effect. Mild chronic white matter ischemic changes are seen. 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 Vascular: No hyperdense vessel or unexpected calcification. Skull: Normal. Negative for fracture or focal lesion. Sinuses/Orbits: No acute finding. Other: None. CT CERVICAL SPINE FINDINGS Alignment: Within normal limits. Skull base and vertebrae: 7 cervical segments are well visualized. Vertebral body height is well maintained. Multilevel facet hypertrophic changes are seen. No acute fracture or acute facet abnormality is noted. Soft tissues and spinal canal: Surrounding soft tissue structures are within normal limits. Upper chest:  Visualized lung apices are within normal limits. Other: None IMPRESSION: CT of the head: Chronic ischemic changes without acute abnormality. CT of the cervical spine: Multilevel degenerative change without acute abnormality. Electronically Signed   By: Alcide Clever M.D.   On: 01/18/2019 17:06   CT Cervical Spine Wo Contrast  Result Date: 01/18/2019 CLINICAL DATA:  Recent fall with headaches and neck pain, initial encounter EXAM: CT HEAD WITHOUT CONTRAST CT CERVICAL SPINE WITHOUT CONTRAST TECHNIQUE: Multidetector CT imaging of the head and cervical spine was performed following the standard protocol without intravenous contrast. Multiplanar CT image reconstructions of the cervical spine were also generated. COMPARISON:  None. FINDINGS: CT HEAD FINDINGS Brain: No evidence of acute infarction, hemorrhage, hydrocephalus, extra-axial collection or mass lesion/mass effect. Mild chronic white matter ischemic changes are seen. 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 Vascular: No hyperdense vessel or unexpected calcification. Skull: Normal. Negative for fracture or focal lesion. Sinuses/Orbits: No acute finding. Other: None. CT CERVICAL SPINE FINDINGS Alignment: Within normal limits. Skull base and vertebrae: 7 cervical  segments are well visualized. Vertebral body height is well maintained. Multilevel facet hypertrophic changes are seen. No acute fracture or acute facet abnormality is noted. Soft tissues and spinal canal: Surrounding soft tissue structures are within normal limits. Upper chest: Visualized lung apices are within normal limits. Other: None IMPRESSION: CT of the head: Chronic ischemic changes without acute abnormality. CT of the cervical spine: Multilevel degenerative change without acute abnormality. Electronically Signed   By: Alcide Clever M.D.   On: 01/18/2019 17:06   DG Hand Complete Left  Result Date: 01/18/2019 CLINICAL DATA:  Fall EXAM: LEFT HAND - COMPLETE 3+ VIEW; LEFT WRIST - COMPLETE 3+  VIEW COMPARISON:  None. FINDINGS: There are fractures of the distal left radius and ulna with dorsal angulation and mild displacement. There are marked changes of osteoarthritis at the first carpometacarpal joint. Less pronounced changes of osteoarthritis are present at the triscaphe and distal interphalangeal joints. IMPRESSION: Acute fractures of the distal left radius and ulna with dorsal angulation and mild displacement. Electronically Signed   By: Guadlupe Spanish M.D.   On: 01/18/2019 13:35   ECHOCARDIOGRAM LIMITED  Result Date: 01/19/2019   ECHOCARDIOGRAM LIMITED REPORT   Patient Name:   KERISHA GOUGHNOUR Date of Exam: 01/19/2019 Medical Rec #:  [id number]      Height:       60.0 in Accession #:    [id number]     Weight:       86.0 lb Date of Birth:  03-24-1945       BSA:          1.30 m Patient Age:    73 years       BP:           159/82 mmHg Patient Gender: F              HR:           105 bpm. Exam [facility]  Procedure: 2D Echo, Cardiac Doppler and Color Doppler Indications:    NSTEMI / Limited Study eval LVEF and WMA  History:        Patient has prior history of Echocardiogram examinations, most                 recent 11/19/2018. Smoker.  Sonographer:    Celesta Gentile RCS Referring Phys: [id number] [place] Lennox IMPRESSIONS  1. Left ventricular ejection fraction, by visual estimation, is 25 to 30%. The left ventricle has severely decreased function. Left ventricular septal wall thickness was normal.  2. Left ventricular diastolic function could not be evaluated.  3. Severely dilated left ventricular internal cavity size.  4. 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  5. Global right ventricle has normal systolc function.The right ventricular size is normal. no increase in right ventricular wall thickness.  6. Left atrial size was mildly dilated.  7. The tricuspid valve was not assessed.   8. Mild aortic valve sclerosis without stenosis.  9. Aortic root could not be assessed. 10. ? prominent chiarri network at dome of RA in 4 chamber view. FINDINGS  Left Ventricle: Left ventricular ejection fraction, by visual estimation, is 25 to 30%. The left ventricle has severely decreased function. Severe hypokinesis of the left ventricular, apical septal wall, anteroseptal wall and inferoseptal wall. Severe hypokinesis of the left ventricular, entire inferior wall. The left ventricular internal cavity size was severely dilated left ventricle. Left  ventricular septal wall thickness was normal. Left ventricular diastolic function could not be evaluated. 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 Right Ventricle: The right ventricular size is normal. No increase in right ventricular wall thickness. Global RV systolic function is has normal systolic function. Left Atrium: Left atrial size was mildly dilated. Right Atrium: Right atrial size was normal in size. Right atrial pressure is estimated at 10 mmHg. ? prominent chiarri network at dome of RA in 4 chamber view. Pericardium: There is no evidence of pericardial effusion is seen. There is no evidence of pericardial effusion. Tricuspid Valve: The tricuspid valve is not assessed. Aortic Valve: The aortic valve is tricuspid. Mild aortic valve sclerosis is present, with no evidence of aortic valve stenosis. Pulmonic Valve: The pulmonic valve was not assessed. Aorta: Aortic root could not be assessed. Shunts: No atrial level shunt detected by color flow Doppler.  LEFT VENTRICLE          Normals PLAX 2D LVIDd:         4.85 cm  3.6 cm LVIDs:         4.40 cm  1.7 cm LV PW:         0.81 cm  1.4 cm LV IVS:        1.01 cm  1.3 cm LVOT diam:     1.70 cm  2.0 cm LV SV:         22 ml    79 ml LV SV Index:   17.59    45 ml/m2 LVOT Area:     2.27 cm 3.14 cm2  LV Volumes  (MOD)             Normals LV area d, A2C:    26.20 cm LV area d, A4C:    31.00 cm LV area s, A2C:    22.10 cm LV area s, A4C:    26.30 cm LV major d, A2C:   7.15 cm LV major d, A4C:   7.32 cm LV major s, A2C:   6.60 cm LV major s, A4C:   6.85 cm LV vol d, MOD A2C: 81.4 ml   68 ml LV vol d, MOD A4C: 108.0 ml LV vol s, MOD A2C: 60.5 ml   24 ml LV vol s, MOD A4C: 84.3 ml LV SV MOD A2C:     20.9 ml LV SV MOD A4C:     108.0 ml LV SV MOD BP:      21.9 ml   45 ml LEFT ATRIUM         Index LA diam:    3.30 cm 2.53 cm/m   AORTA                 Normals Ao Root diam: 2.80 cm 31 mm  SHUNTS Systemic Diam: 1.70 cm  Jenkins Rouge MD Electronically signed by Jenkins Rouge MD Signature Date/Time: 01/19/2019/2:10:34 PM    Final     Cardiac Studies   2D echo - severe LV dysfunction, with no significant change from prior echo 2 months ago.  Patient Profile     74 y.o. female admitted with acute GI bleeding in the setting of an ICM with evidence of global ischemia on the ECG  Assessment & Plan    1. NSTEMI - secondary to acute GI bleeding. Conservative therapy. She is feeling better after transfusion. No plans for any cardiac evaluation. 2. Acute  GI bleeding - await [facility] input as to EGD/colonoscopy.  3. Anemia - she is s/p transfusion and H/H pending. Clinically much improved.     For questions or updates, please contact [facility] Please consult [email or web address] for contact info under Cardiology/STEMI.      Signed, Lewayne Bunting, MD  01/20/2019, 8:40 AM  Patient ID: [name], female   DOB: 12-27-1945, 74 y.o.   MRN: [id number]

## 2019-01-21 DIAGNOSIS — R195 Other fecal abnormalities: Secondary | ICD-10-CM

## 2019-01-21 DIAGNOSIS — D62 Acute posthemorrhagic anemia: Secondary | ICD-10-CM

## 2019-01-21 DIAGNOSIS — I214 Non-ST elevation (NSTEMI) myocardial infarction: Secondary | ICD-10-CM

## 2019-01-21 LAB — CBC
HCT: 24.4 % — ABNORMAL LOW (ref 36.0–46.0)
Hemoglobin: 8 g/dL — ABNORMAL LOW (ref 12.0–15.0)
MCH: 30.4 pg (ref 26.0–34.0)
MCHC: 32.8 g/dL (ref 30.0–36.0)
MCV: 92.8 fL (ref 80.0–100.0)
Platelets: 134 10*3/uL — ABNORMAL LOW (ref 150–400)
RBC: 2.63 MIL/uL — ABNORMAL LOW (ref 3.87–5.11)
RDW: 17.5 % — ABNORMAL HIGH (ref 11.5–15.5)
WBC: 9.3 10*3/uL (ref 4.0–10.5)
nRBC: 0.3 % — ABNORMAL HIGH (ref 0.0–0.2)

## 2019-01-21 LAB — BASIC METABOLIC PANEL
Anion gap: 7 (ref 5–15)
BUN: 20 mg/dL (ref 8–23)
CO2: 25 mmol/L (ref 22–32)
Calcium: 7.8 mg/dL — ABNORMAL LOW (ref 8.9–10.3)
Chloride: 99 mmol/L (ref 98–111)
Creatinine, Ser: 1.17 mg/dL — ABNORMAL HIGH (ref 0.44–1.00)
GFR calc Af Amer: 53 mL/min — ABNORMAL LOW (ref >60)
GFR calc non Af Amer: 46 mL/min — ABNORMAL LOW (ref >60)
Glucose, Bld: 119 mg/dL — ABNORMAL HIGH (ref 70–99)
Potassium: 3.3 mmol/L — ABNORMAL LOW (ref 3.5–5.1)
Sodium: 131 mmol/L — ABNORMAL LOW (ref 135–145)

## 2019-01-21 MED ORDER — DIPHENHYDRAMINE HCL 50 MG/ML IJ SOLN
12.5000 mg | Freq: Once | INTRAMUSCULAR | Status: AC
  Administered 2019-01-21: 22:00:00 12.5 mg via INTRAVENOUS
  Filled 2019-01-21: qty 1

## 2019-01-21 NOTE — H&P (View-Only) (Signed)
Referring Provider:  [facility]         Primary Care Physician:  [name], MD Primary Gastroenterologist: Jonette Eva, MD             We were asked to see this patient for:    Anemia / FOBT+              ASSESSMENT /  PLAN      74 yo female with pmh significant for but not limited to CAD / remote CABG / PCI / combined systolic and diastolic CHF, HTN, CKD3, COPD, hyperlipidemia, GERD, dementia  1. 74 yo female with normocytic anemia, FOBT+ in setting of chronic [place]y powder use and dual anti-platelet therapy since November (NSTEMI). Rule out erosive disease / PUD in setting of heavy NSAID use.  --hgb 8 today, up from 6.7 after 2 units of blood --Agree with BID PPI --Trend H+H --Will schedule for EGD to be done tomorrow. The risks and benefits of EGD were discussed and the patient. She would like to discuss with husband before signing consent.   2. CAD / CABG. NSTEMI in November. No acute changes on cardiac cath. Decision made to treat medically.   3. Combined systolic diastolic heart failure. EF 25-30%   HPI:    Chief Complaint: anemia  Susan [name] is a 74 y.o. female who presented to [facility] ED on 01/17/18 with weakness x 2 days. She fell at home, sustained radius and ulna fracture s/p reduction in ED. Still some displacement on xrays. Her labs were remarkable for new anemia. In November patient had a NSTEMI. No acute changes on cardiac cath. Plan was to treat medically, subsequently started on asa + plavix. Since then her hgb has declined from 14.7 to 6.7,  she is FOBT+.  Transfused 2u blood. Hgb improved to 8.6 but down to 8 today.  Patient denies any rectal bleeding or black stools. Says she learned of blood being in stool while at [facility].  She chronically takes [place] powders 2-3 times a day. Patient has no abdominal pain. No nausea / vomiting. No bowel changes. Reports good appetitie   Past Medical History:  Diagnosis Date  . Acute on chronic systolic and  diastolic heart failure, NYHA class 1 (HCC) 11/21/2018  . Anxiety   . CAD (coronary artery disease)    a. s/p CABG x5 in 2000 b. cath in 02/2017 showing 2/5 patent grafts with patent LIMA-LAD, patent SVG-distal RCA, occluded SVG-D1 and occluded seq-SVG-OM1-OM2 with medical management recommended and possible PCI of distal RCA branches in the future if recurrent anginal symptoms  . CHF (congestive heart failure) (HCC)    a. EF 20-25% by echo in 12/2016 b. EF 45-50% by echo in 05/2017 c. EF 30% by echo in 11/2018  . Chronic bronchitis (HCC)   . CKD (chronic kidney disease) stage 3, GFR 30-59 ml/min 11/21/2018  . COPD (chronic obstructive pulmonary disease) (HCC)   . Esophageal reflux   . Essential hypertension   . Fatigue   . HLD (hyperlipidemia) 11/21/2018  . HTN (hypertension) 11/21/2018  . Hypercholesteremia   . Ovarian failure     Past Surgical History:  Procedure Laterality Date  . ABDOMINAL HYSTERECTOMY    . APPENDECTOMY    . BREAST BIOPSY Right   . CARDIAC CATHETERIZATION  2000  . CORONARY ARTERY BYPASS GRAFT  2000   CABG X5  . LEFT HEART CATH AND CORS/GRAFTS ANGIOGRAPHY N/A 02/17/2017   Procedure: LEFT HEART CATH AND CORS/GRAFTS  ANGIOGRAPHY;  Surgeon: Kathleene Hazel, MD;  Location: [facility] INVASIVE CV LAB;  Service: Cardiovascular;  Laterality: N/A;  . LEFT HEART CATH AND CORS/GRAFTS ANGIOGRAPHY N/A 11/20/2018   Procedure: LEFT HEART CATH AND CORS/GRAFTS ANGIOGRAPHY;  Surgeon: Yvonne Kendall, MD;  Location: [facility] INVASIVE CV LAB;  Service: Cardiovascular;  Laterality: N/A;    Prior to Admission medications   Medication Sig Start Date End Date Taking? Authorizing Provider  aspirin EC 81 MG tablet Take 81 mg by mouth daily.   Yes [provider]  clopidogrel (PLAVIX) 75 MG tablet Take 1 tablet (75 mg total) by mouth daily with breakfast. 11/22/18  Yes Leone Brand, NP  furosemide (LASIX) 40 MG tablet Take 1 tablet (40 mg total) by mouth daily. (may take extra as  needed for weight gain of 3 pounds in 1 day or 5 pounds in 1 week) 12/11/18  Yes Laqueta Linden, MD  hydrALAZINE (APRESOLINE) 25 MG tablet Take 1 tablet (25 mg total) by mouth 3 (three) times daily. 12/11/18  Yes Laqueta Linden, MD  isosorbide mononitrate (IMDUR) 30 MG 24 hr tablet Take 0.5 tablets (15 mg total) by mouth daily. 11/22/18  Yes Leone Brand, NP  metoprolol succinate (TOPROL-XL) 25 MG 24 hr tablet Take 0.5 tablets (12.5 mg total) by mouth daily. 11/22/18  Yes Leone Brand, NP  Multiple Vitamin (MULTIVITAMIN) tablet Take 1 tablet by mouth daily.   Yes [provider]  nitroGLYCERIN (NITROSTAT) 0.4 MG SL tablet Place 1 tablet (0.4 mg total) under the tongue every 5 (five) minutes x 3 doses as needed for chest pain. 02/18/17  Yes Kilroy, Luke K, PA-C  potassium chloride SA (KLOR-CON M20) 20 MEQ tablet Take 1 tablet (20 mEq total) by mouth daily. (may take one extra tab when taking extra Lasix) 12/11/18  Yes Laqueta Linden, MD  rosuvastatin (CRESTOR) 5 MG tablet Take 5 mg by mouth daily. 12/22/18  Yes [provider]  vitamin B-12 (CYANOCOBALAMIN) 250 MCG tablet Take 250 mcg by mouth daily.   Yes [provider]    Current Facility-Administered Medications  Medication Dose Route Frequency Provider Last Rate Last Admin  . 0.9 %  sodium chloride infusion (Manually program via Guardrails IV Fluids)   Intravenous Once [place], Gagan S, MD      . 0.9 %  sodium chloride infusion (Manually program via Guardrails IV Fluids)   Intravenous Once Johnson, Clanford L, MD      . 0.9 %  sodium chloride infusion  250 mL Intravenous PRN Sharl Ma, Gagan S, MD      . 0.9 %  sodium chloride infusion   Intravenous Continuous Wynne Dust A, NP 20 mL/hr at 01/20/19 1739 Rate Verify at 01/20/19 1739  . acetaminophen (TYLENOL) tablet 650 mg  650 mg Oral Q6H Westley Chandler, MD   650 mg at 01/20/19 2206  . furosemide (LASIX) tablet 40 mg  40 mg Oral Daily Westley Chandler, MD    40 mg at 01/21/19 0944  . hydrALAZINE (APRESOLINE) tablet 25 mg  25 mg Oral TID Meredeth Ide, MD   25 mg at 01/21/19 0945  . isosorbide mononitrate (IMDUR) 24 hr tablet 15 mg  15 mg Oral Daily Meredeth Ide, MD   15 mg at 01/21/19 0942  . metoprolol succinate (TOPROL-XL) 24 hr tablet 12.5 mg  12.5 mg Oral Daily Meredeth Ide, MD   12.5 mg at 01/21/19 0945  . nitroGLYCERIN (NITROSTAT) SL tablet 0.4  mg  0.4 mg Sublingual Q5 Min x 3 PRN Meredeth Ide, MD      . ondansetron [facility]) tablet 4 mg  4 mg Oral Q6H PRN Meredeth Ide, MD       Or  . ondansetron (ZOFRAN) injection 4 mg  4 mg Intravenous Q6H PRN Meredeth Ide, MD      . oxyCODONE (Oxy IR/ROXICODONE) immediate release tablet 5 mg  5 mg Oral Q6H PRN Jonette Eva L, MD   5 mg at 01/20/19 0740  . pantoprazole (PROTONIX) injection 40 mg  40 mg Intravenous Q12H Westley Chandler, MD   40 mg at 01/21/19 1610  . rosuvastatin (CRESTOR) tablet 5 mg  5 mg Oral Daily Meredeth Ide, MD   5 mg at 01/21/19 0944  . sodium chloride flush (NS) 0.9 % injection 3 mL  3 mL Intravenous Q12H Meredeth Ide, MD   3 mL at 01/21/19 0946  . sodium chloride flush (NS) 0.9 % injection 3 mL  3 mL Intravenous PRN Meredeth Ide, MD        Allergies as of 01/18/2019  . (No Known Allergies)    Family History  Problem Relation Age of Onset  . Heart attack Mother   . Hypertension Father     Social History   Socioeconomic History  . Marital status: Married    Spouse name: Not on file  . Number of children: Not on file  . Years of education: Not on file  . Highest education level: Not on file  Occupational History  . Not on file  Tobacco Use  . Smoking status: Current Every Day Smoker    Packs/day: 1.00    Years: 50.00    Pack years: 50.00    Types: Cigarettes    Last attempt to quit: 02/22/2017    Years since quitting: 1.9  . Smokeless tobacco: Never Used  Substance and Sexual Activity  . Alcohol use: No  . Drug use: No  . Sexual activity: Never    Other Topics Concern  . Not on file  Social History Narrative  . Not on file   Social Determinants of Health   Financial Resource Strain:   . Difficulty of Paying Living Expenses: Not on file  Food Insecurity:   . Worried About Programme researcher, broadcasting/film/video in the Last Year: Not on file  . Ran Out of Food in the Last Year: Not on file  Transportation Needs:   . Lack of Transportation (Medical): Not on file  . Lack of Transportation (Non-Medical): Not on file  Physical Activity:   . Days of Exercise per Week: Not on file  . Minutes of Exercise per Session: Not on file  Stress:   . Feeling of Stress : Not on file  Social Connections:   . Frequency of Communication with Friends and Family: Not on file  . Frequency of Social Gatherings with Friends and Family: Not on file  . Attends Religious Services: Not on file  . Active Member of Clubs or Organizations: Not on file  . Attends Banker Meetings: Not on file  . Marital Status: Not on file  Intimate Partner Violence:   . Fear of Current or Ex-Partner: Not on file  . Emotionally Abused: Not on file  . Physically Abused: Not on file  . Sexually Abused: Not on file    Review of Systems: All systems reviewed and negative except where noted in HPI.  Physical Exam:  Vital signs in last 24 hours: Temp:  [97.9 F (36.6 C)-99.5 F (37.5 C)] 97.9 F (36.6 C) (01/10 0601) Pulse Rate:  [79-96] 79 (01/10 0601) Resp:  [19-28] 20 (01/10 0601) BP: (113-118)/(67-102) 118/68 (01/10 0601) SpO2:  [91 %-96 %] 96 % (01/10 0601) Weight:  [39.1 kg] 39.1 kg (01/10 0601) Last BM Date: 01/19/19 General:   Alert, thin female in NAD Psych:  Pleasant, cooperative. Normal mood and affect. Eyes:  Pupils equal, sclera clear, no icterus.   Conjunctiva pink. Ears:  Normal auditory acuity. Nose:  No deformity, discharge,  or lesions. Neck:  Supple; no masses Lungs:  Clear throughout to auscultation.   No wheezes, crackles, or rhonchi.  Heart:   Regular rate and rhythm;  no lower extremity edema Abdomen:  Soft, non-distended, nontender, BS active, no palp mass   Rectal:  Deferred  Msk:  Symmetrical without gross deformities. . Neurologic:  Alert and  Oriented, grossly normal neurologically. Skin:  Intact without significant lesions or rashes.   Intake/Output from previous day: 01/09 0701 - 01/10 0700 In: 1068.8 [P.O.:840; I.V.:228.8] Out: 200 [Urine:200] Intake/Output this shift: Total I/O In: -  Out: 350 [Urine:350]  Lab Results: Recent Labs    01/20/19 0752 01/20/19 1816 01/21/19 0140  WBC 10.7* 11.0* 9.3  HGB 9.1* 8.6* 8.0*  HCT 27.0* 25.9* 24.4*  PLT 143* 138* 134*   BMET Recent Labs    01/19/19 0335 01/20/19 0752 01/21/19 0140  NA 134* 134* 131*  K 3.9 3.6 3.3*  CL 103 97* 99  CO2 22 27 25   GLUCOSE 138* 141* 119*  BUN 53* 32* 20  CREATININE 1.11* 1.31* 1.17*  CALCIUM 8.0* 8.2* 7.8*   LFT Recent Labs    01/20/19 0752  PROT 5.1*  ALBUMIN 2.6*  AST 148*  ALT 29  ALKPHOS 39  BILITOT 0.8  BILIDIR 0.2  IBILI 0.6    Hepatitis Panel No results for input(s): HEPBSAG, HCVAB, HEPAIGM, HEPBIGM in the last 72 hours.   . CBC Latest Ref Rng & Units 01/21/2019 01/20/2019 01/20/2019  WBC 4.0 - 10.5 K/uL 9.3 11.0(H) 10.7(H)  Hemoglobin 12.0 - 15.0 g/dL 8.0(L) 8.6(L) 9.1(L)  Hematocrit 36.0 - 46.0 % 24.4(L) 25.9(L) 27.0(L)  Platelets 150 - 400 K/uL 134(L) 138(L) 143(L)    . CMP Latest Ref Rng & Units 01/21/2019 01/20/2019 01/19/2019  Glucose 70 - 99 mg/dL 791(T) 056(P) 794(I)  BUN 8 - 23 mg/dL 20 01(K) 55(V)  Creatinine 0.44 - 1.00 mg/dL 7.48(O) 7.07(E) 6.75(Q)  Sodium 135 - 145 mmol/L 131(L) 134(L) 134(L)  Potassium 3.5 - 5.1 mmol/L 3.3(L) 3.6 3.9  Chloride 98 - 111 mmol/L 99 97(L) 103  CO2 22 - 32 mmol/L 25 27 22   Calcium 8.9 - 10.3 mg/dL 7.8(L) 8.2(L) 8.0(L)  Total Protein 6.5 - 8.1 g/dL - 5.1(L) 5.0(L)  Total Bilirubin 0.3 - 1.2 mg/dL - 0.8 0.6  Alkaline Phos 38 - 126 U/L - 39 36(L)  AST 15 - 41  U/L - 148(H) 57(H)  ALT 0 - 44 U/L - 29 16   Studies/Results: ECHOCARDIOGRAM LIMITED  Result Date: 01/19/2019   ECHOCARDIOGRAM LIMITED REPORT   Patient Name:   Susan [name] Date of Exam: 01/19/2019 Medical Rec #:  [id number]      Height:       60.0 in Accession #:    [id number]     Weight:       86.0 lb Date of Birth:  [birth year]       BSA:  1.30 m Patient Age:    73 years       BP:           159/82 mmHg Patient Gender: F              HR:           105 bpm. [facility]  Procedure: 2D Echo, Cardiac Doppler and Color Doppler Indications:    NSTEMI / Limited Study eval LVEF and WMA  History:        Patient has prior history of Echocardiogram examinations, most                 recent 11/19/2018. Smoker.  Sonographer:    Celesta Gentile RCS Referring Phys: [id number] Ellsworth Lennox IMPRESSIONS  1. Left ventricular ejection fraction, by visual estimation, is 25 to 30%. The left ventricle has severely decreased function. Left ventricular septal wall thickness was normal.  2. Left ventricular diastolic function could not be evaluated.  3. Severely dilated left ventricular internal cavity size.  4. 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  5. Global right ventricle has normal systolc function.The right ventricular size is normal. no increase in right ventricular wall thickness.  6. Left atrial size was mildly dilated.  7. The tricuspid valve was not assessed.  8. Mild aortic valve sclerosis without stenosis.  9. Aortic root could not be assessed. 10. ? prominent chiarri network at dome of RA in 4 chamber view. FINDINGS  Left Ventricle: Left ventricular ejection fraction, by visual estimation, is 25 to 30%. The left ventricle has severely decreased function. Severe hypokinesis of the left ventricular, apical septal wall, anteroseptal wall and inferoseptal wall. Severe hypokinesis of the left  ventricular, entire inferior wall. The left ventricular internal cavity size was severely dilated left ventricle. Left ventricular septal wall thickness was normal. Left ventricular diastolic function could not be evaluated. 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 Right Ventricle: The right ventricular size is normal. No increase in right ventricular wall thickness. Global RV systolic function is has normal systolic function. Left Atrium: Left atrial size was mildly dilated. Right Atrium: Right atrial size was normal in size. Right atrial pressure is estimated at 10 mmHg. ? prominent chiarri network at dome of RA in 4 chamber view. Pericardium: There is no evidence of pericardial effusion is seen. There is no evidence of pericardial effusion. Tricuspid Valve: The tricuspid valve is not assessed. Aortic Valve: The aortic valve is tricuspid. Mild aortic valve sclerosis is present, with no evidence of aortic valve stenosis. Pulmonic Valve: The pulmonic valve was not assessed. Aorta: Aortic root could not be assessed. Shunts: No atrial level shunt detected by color flow Doppler.  LEFT VENTRICLE          Normals PLAX 2D LVIDd:         4.85 cm  3.6 cm LVIDs:         4.40 cm  1.7 cm LV PW:         0.81 cm  1.4 cm LV IVS:        1.01 cm  1.3 cm LVOT diam:     1.70 cm  2.0 cm LV SV:         22 ml    79 ml LV SV Index:  17.59    45 ml/m2 LVOT Area:     2.27 cm 3.14 cm2  LV Volumes (MOD)             Normals LV area d, A2C:    26.20 cm LV area d, A4C:    31.00 cm LV area s, A2C:    22.10 cm LV area s, A4C:    26.30 cm LV major d, A2C:   7.15 cm LV major d, A4C:   7.32 cm LV major s, A2C:   6.60 cm LV major s, A4C:   6.85 cm LV vol d, MOD A2C: 81.4 ml   68 ml LV vol d, MOD A4C: 108.0 ml LV vol s, MOD A2C: 60.5 ml   24 ml LV vol s, MOD A4C: 84.3 ml LV SV MOD A2C:     20.9 ml LV SV MOD A4C:     108.0 ml LV SV MOD  BP:      21.9 ml   45 ml LEFT ATRIUM         Index LA diam:    3.30 cm 2.53 cm/m   AORTA                 Normals Ao Root diam: 2.80 cm 31 mm  SHUNTS Systemic Diam: 1.70 cm  Jenkins Rouge MD Electronically signed by Jenkins Rouge MD Signature Date/Time: 01/19/2019/2:10:34 PM    Final     Active Problems:   Hx of CABG   SEMI (subendocardial myocardial infarction) ([place])   GI bleed   Acute upper GI bleed    Tye Savoy, NP-C @  01/21/2019, 12:30 PM  GI ATTENDING  History, laboratories, x-rays reviewed.  Patient seen and examined.  Agree with comprehensive consultation note as outlined above.  Patient with multiple significant medical problems as outlined above who presented to [facility] with symptomatic anemia and on demand ischemia.  The gastroenterologist recommended transfer to [facility] for available cardiology services and endoscopy.  No overt GI bleeding but significant interval change in hemoglobin.  In addition aspirin and Plavix she has been on multiple NSAIDs in the form of headache powders as an outpatient.  She was Hemoccult positive.  She was seen by cardiology who felt that this was on demand ischemia.  Cleared for EGD.  Feeling better posttransfusion.  On PPI.  Plan for upper endoscopy in a.m.  With Dr. Hilarie Fredrickson. The nature of the procedure, as well as the risks, benefits, and alternatives were carefully and thoroughly reviewed with the patient. Ample time for discussion and questions allowed. The patient understood, was satisfied, and agreed to proceed.  The patient is HIGH RISK given her age, comorbidities, and recent non-STEMI.  Would imagine that she could be discharged sometime thereafter.  She would resume her subsequent GI care with Dr. Oneida Alar.  Docia Chuck. Geri Seminole., M.D. [facility] Division of Gastroenterology

## 2019-01-21 NOTE — Progress Notes (Signed)
PROGRESS NOTE    Susan [name]  JOA:[id number] DOB: [birth year] DOA: 01/18/2019 PCP: [name], MD   Brief Narrative:   74 year old woman with history of coronary artery disease status post CABG on dual antiplatelet therapy presenting with acute anemia likely secondary to GI bleed.  The patient initially presented to [facility] with fatigue and weakness over the past month.  She endorses some black stools and had a positive FOBT.  She is transferred to [facility] for further evaluation with gastroenterology here.  Cardiology was also consulted. -For EGD on 01/22/2019   Assessment & Plan:   Active Problems:   Hx of CABG   SEMI (subendocardial myocardial infarction) (HCC)   GI bleed   Acute upper GI bleed   Acute anemia, likely secondary to GI bleed in the setting of dual antiplatelet use.  Received 2 units of packed red blood cells for hemoglobin 6.7. No melena or hematochezia, continue to monitor. Type and hold ordered here.  - Hemoglobin now 8.0  --GI consult appreciated continue PPI, for EGD on 01/22/2019  History of CAD/NSTEMI-troponin peaked at 4940,  elevated troponin most likely due to demand ischemia in the setting of acute GI bleed ---Continue to hold aspirin and Plavix in setting of GI bleed.  Monitor for symptoms of ischemia. -Continue Imdur, metoprolol and Crestor.   -Cardiology consult appreciated they recommend medical management at this time -Cardiology service signed off as of 01/21/2019  Heart failure with reduced ejection fraction- -repeat echo 01/19/2019 with EF of 25 to 30% with findings consistent with multivessel CAD--- - Appreciate Cardiology recommendations  -Continue Lasix 40 mg daily  Distal radius and ulnar facture, she will need follow-up with orthopedics.  Standing Tylenol ordered.  Oxycodone for severe pain. Consider bisphosphonate as outpatient.   CKD II, monitor creatinine, avoid nephrotoxic agents. Baseline appears to be ~1.10.   Elevated  AST-denies significant EtOH use, no abdominal pain this AM. Monitor.   DVT prophylaxis: SCDs   Code Status: FULL Family Communication: Discussed with patient Disposition Plan: Likely inpatient 1-2 more days pending GI evaluation     Consultants:   Gastroenterology (Dr. Henrene Pastor )  Cardiology   Procedures:   None  Antimicrobials:   None    Subjective:  -Eating and drinking well, no new concerns, -No emesis.    Objective: Vitals:   01/21/19 0600 01/21/19 0601 01/21/19 1623 01/21/19 1625  BP: 118/68 118/68 122/83 122/83  Pulse:  79 83   Resp: 19 20 18  (!) 21  Temp:  97.9 F (36.6 C) 97.8 F (36.6 C)   TempSrc:  Oral Oral   SpO2:  96% 99%   Weight:  39.1 kg    Height:        Intake/Output Summary (Last 24 hours) at 01/21/2019 1848 Last data filed at 01/21/2019 1500 Gross per 24 hour  Intake 960 ml  Output 900 ml  Net 60 ml   Filed Weights   01/18/19 1251 01/21/19 0601  Weight: 39 kg 39.1 kg    Examination:  General exam: Appears calm and comfortable  Respiratory system: Clear to auscultation. Respiratory effort normal. Cardiovascular system: S1 & S2 heard, RRR. Sternotomy scar.  Gastrointestinal system: Abdomen is nondistended, soft and nontender.  Normal bowel sounds heard.  Old midline laparotomy scar Central nervous system: Alert and oriented to place. Uncertain of day. No focal neurological deficits. Skin: No rashes, lesions or ulcers Psychiatry: Judgement and insight appear normal. Mood & affect appropriate.  EXT: Left UE splint, good  cap refill and sensation     Data Reviewed: I have personally reviewed following labs and imaging studies  CBC: Recent Labs  Lab 01/18/19 1450 01/19/19 0335 01/20/19 0752 01/20/19 1816 01/21/19 0140  WBC 12.4* 10.3 10.7* 11.0* 9.3  HGB 9.2* 6.7* 9.1* 8.6* 8.0*  HCT 27.8* 20.7* 27.0* 25.9* 24.4*  MCV 100.0 101.5* 90.9 92.2 92.8  PLT 238 180 143* 138* 134*   Basic Metabolic Panel: Recent Labs  Lab  01/18/19 1450 01/19/19 0335 01/20/19 0752 01/21/19 0140  NA 135 134* 134* 131*  K 3.9 3.9 3.6 3.3*  CL 102 103 97* 99  CO2 23 22 27 25   GLUCOSE 117* 138* 141* 119*  BUN 64* 53* 32* 20  CREATININE 1.09* 1.11* 1.31* 1.17*  CALCIUM 8.6* 8.0* 8.2* 7.8*   GFR: Estimated Creatinine Clearance: 26 mL/min (A) (by C-G formula based on SCr of 1.17 mg/dL (H)). Liver Function Tests: Recent Labs  Lab 01/18/19 1450 01/19/19 0335 01/20/19 0752  AST 20 57* 148*  ALT 15 16 29   ALKPHOS 46 36* 39  BILITOT 0.6 0.6 0.8  PROT 6.0* 5.0* 5.1*  ALBUMIN 3.2* 2.6* 2.6*   CB  Recent Results (from the past 240 hour(s))  SARS CORONAVIRUS 2 (TAT 6-24 HRS) Nasopharyngeal Nasopharyngeal Swab     Status: None   Collection Time: 01/18/19  5:25 PM   Specimen: Nasopharyngeal Swab  Result Value Ref Range Status   SARS Coronavirus 2 NEGATIVE NEGATIVE Final    Comment: (NOTE) SARS-CoV-2 target nucleic acids are NOT DETECTED. The SARS-CoV-2 RNA is generally detectable in upper and lower respiratory specimens during the acute phase of infection. Negative results do not preclude SARS-CoV-2 infection, do not rule out co-infections with other pathogens, and should not be used as the sole basis for treatment or other patient management decisions. Negative results must be combined with clinical observations, patient history, and epidemiological information. The expected result is Negative. Fact Sheet for Patients: Fact Sheet for Healthcare Providers: 03/18/19 This test is not yet approved or cleared by the [place] FDA and  has been authorized for detection and/or diagnosis of SARS-CoV-2 by FDA under an Emergency Use Authorization (EUA). This EUA will remain  in effect (meaning this test can be used) for the duration of the COVID-19 declaration under Section 56 4(b)(1) of the Act, 21 U.S.C. section 360bbb-3(b)(1), unless the  authorization is terminated or revoked sooner. Performed at [facility], [address] [place], [address] [place]      Radiology Studies: No results found.   Scheduled Meds: . sodium chloride   Intravenous Once  . sodium chloride   Intravenous Once  . acetaminophen  650 mg Oral Q6H  . furosemide  40 mg Oral Daily  . hydrALAZINE  25 mg Oral TID  . isosorbide mononitrate  15 mg Oral Daily  . metoprolol succinate  12.5 mg Oral Daily  . pantoprazole (PROTONIX) IV  40 mg Intravenous Q12H  . rosuvastatin  5 mg Oral Daily  . sodium chloride flush  3 mL Intravenous Q12H   Continuous Infusions: . sodium chloride    . sodium chloride 20 mL/hr at 01/20/19 1739     LOS: 3 days   [id number], MD [facility]  If 7PM-7AM, please contact night-coverage [email or web address] Password [facility] 01/21/2019, 6:48 PM

## 2019-01-21 NOTE — Progress Notes (Signed)
Progress Note  Patient Name: Susan [name] Date of Encounter: 01/21/2019  Primary Cardiologist: Kate Sable, MD   Subjective   No chest pain or sob.   Inpatient Medications    Scheduled Meds: . sodium chloride   Intravenous Once  . sodium chloride   Intravenous Once  . acetaminophen  650 mg Oral Q6H  . furosemide  40 mg Oral Daily  . hydrALAZINE  25 mg Oral TID  . isosorbide mononitrate  15 mg Oral Daily  . metoprolol succinate  12.5 mg Oral Daily  . pantoprazole (PROTONIX) IV  40 mg Intravenous Q12H  . rosuvastatin  5 mg Oral Daily  . sodium chloride flush  3 mL Intravenous Q12H   Continuous Infusions: . sodium chloride    . sodium chloride 20 mL/hr at 01/20/19 1739   PRN Meds: sodium chloride, nitroGLYCERIN, ondansetron **OR** ondansetron (ZOFRAN) IV, oxyCODONE, sodium chloride flush   Vital Signs    Vitals:   01/20/19 0948 01/20/19 1643 01/20/19 2015 01/21/19 0601  BP: 109/77 (!) 113/102 118/67 118/68  Pulse:   96 79  Resp: 17 (!) 28 (!) 22 20  Temp:   99.5 F (37.5 C) 97.9 F (36.6 C)  TempSrc:   Oral Oral  SpO2:   91% 96%  Weight:    39.1 kg  Height:        Intake/Output Summary (Last 24 hours) at 01/21/2019 0924 Last data filed at 01/21/2019 0604 Gross per 24 hour  Intake 1068.8 ml  Output 200 ml  Net 868.8 ml   Filed Weights   01/18/19 1251 01/21/19 0601  Weight: 39 kg 39.1 kg    Telemetry    nsr - Personally Reviewed  ECG    none - Personally Reviewed  Physical Exam   GEN: No acute distress.   Neck: No JVD Cardiac: RRR, no murmurs, rubs, or gallops.  Respiratory: Clear to auscultation bilaterally. GI: Soft, nontender, non-distended  MS: No edema; No deformity. Neuro:  Nonfocal  Psych: Normal affect   Labs    Chemistry Recent Labs  Lab 01/18/19 1450 01/19/19 0335 01/20/19 0752 01/21/19 0140  NA 135 134* 134* 131*  K 3.9 3.9 3.6 3.3*  CL 102 103 97* 99  CO2 23 22 27 25   GLUCOSE 117* 138* 141* 119*  BUN 64* 53*  32* 20  CREATININE 1.09* 1.11* 1.31* 1.17*  CALCIUM 8.6* 8.0* 8.2* 7.8*  PROT 6.0* 5.0* 5.1*  --   ALBUMIN 3.2* 2.6* 2.6*  --   AST 20 57* 148*  --   ALT 15 16 29   --   ALKPHOS 46 36* 39  --   BILITOT 0.6 0.6 0.8  --   GFRNONAA 50* 49* 40* 46*  GFRAA 58* 57* 46* 53*  ANIONGAP 10 9 10 7      Hematology Recent Labs  Lab 01/20/19 0752 01/20/19 1816 01/21/19 0140  WBC 10.7* 11.0* 9.3  RBC 2.97* 2.81* 2.63*  HGB 9.1* 8.6* 8.0*  HCT 27.0* 25.9* 24.4*  MCV 90.9 92.2 92.8  MCH 30.6 30.6 30.4  MCHC 33.7 33.2 32.8  RDW 18.0* 17.8* 17.5*  PLT 143* 138* 134*    Cardiac EnzymesNo results for input(s): TROPONINI in the last 168 hours. No results for input(s): TROPIPOC in the last 168 hours.   BNP Recent Labs  Lab 01/18/19 1450  BNP 496.0*     DDimer No results for input(s): DDIMER in the last 168 hours.   Radiology    ECHOCARDIOGRAM LIMITED  Result  Date: 01/19/2019   ECHOCARDIOGRAM LIMITED REPORT   Patient Name:   Susan [name] Date of Exam: 01/19/2019 Medical Rec #:  [id number]      Height:       60.0 in Accession #:    [id number]     Weight:       86.0 lb Date of Birth:  Apr 30, 1945       BSA:          1.30 m Patient Age:    73 years       BP:           159/82 mmHg Patient Gender: F              HR:           105 bpm. [facility]  Procedure: 2D Echo, Cardiac Doppler and Color Doppler Indications:    NSTEMI / Limited Study eval LVEF and WMA  History:        Patient has prior history of Echocardiogram examinations, most                 recent 11/19/2018. Smoker.  Sonographer:    Celesta Gentile RCS Referring Phys: [id number] Ellsworth Lennox IMPRESSIONS  1. Left ventricular ejection fraction, by visual estimation, is 25 to 30%. The left ventricle has severely decreased function. Left ventricular septal wall thickness was normal.  2. Left ventricular diastolic function could not be evaluated.  3. Severely dilated left ventricular internal cavity size.  4. 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  5. Global right ventricle has normal systolc function.The right ventricular size is normal. no increase in right ventricular wall thickness.  6. Left atrial size was mildly dilated.  7. The tricuspid valve was not assessed.  8. Mild aortic valve sclerosis without stenosis.  9. Aortic root could not be assessed. 10. ? prominent chiarri network at dome of RA in 4 chamber view. FINDINGS  Left Ventricle: Left ventricular ejection fraction, by visual estimation, is 25 to 30%. The left ventricle has severely decreased function. Severe hypokinesis of the left ventricular, apical septal wall, anteroseptal wall and inferoseptal wall. Severe hypokinesis of the left ventricular, entire inferior wall. The left ventricular internal cavity size was severely dilated left ventricle. Left ventricular septal wall thickness was normal. Left ventricular diastolic function could not be evaluated. 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 Right Ventricle: The right ventricular size is normal. No increase in right ventricular wall thickness. Global RV systolic function is has normal systolic function. Left Atrium: Left atrial size was mildly dilated. Right Atrium: Right atrial size was normal in size. Right atrial pressure is estimated at 10 mmHg. ? prominent chiarri network at dome of RA in 4 chamber view. Pericardium: There is no evidence of pericardial effusion is seen. There is no evidence of pericardial effusion. Tricuspid Valve: The tricuspid valve is not assessed. Aortic Valve: The aortic valve is tricuspid. Mild aortic valve sclerosis is present, with no evidence of aortic valve stenosis. Pulmonic Valve: The pulmonic valve was not assessed. Aorta: Aortic root could not be assessed. Shunts: No  atrial level shunt detected by color flow Doppler.  LEFT VENTRICLE          Normals PLAX 2D LVIDd:         4.85 cm  3.6 cm LVIDs:         4.40 cm  1.7 cm LV PW:         0.81 cm  1.4 cm LV IVS:        1.01 cm  1.3 cm LVOT diam:     1.70 cm  2.0 cm LV SV:         22 ml    79 ml LV SV Index:   17.59    45 ml/m2 LVOT Area:     2.27 cm 3.14 cm2  LV Volumes (MOD)             Normals LV area d, A2C:    26.20 cm LV area d, A4C:    31.00 cm LV area s, A2C:    22.10 cm LV area s, A4C:    26.30 cm LV major d, A2C:   7.15 cm LV major d, A4C:   7.32 cm LV major s, A2C:   6.60 cm LV major s, A4C:   6.85 cm LV vol d, MOD A2C: 81.4 ml   68 ml LV vol d, MOD A4C: 108.0 ml LV vol s, MOD A2C: 60.5 ml   24 ml LV vol s, MOD A4C: 84.3 ml LV SV MOD A2C:     20.9 ml LV SV MOD A4C:     108.0 ml LV SV MOD BP:      21.9 ml   45 ml LEFT ATRIUM         Index LA diam:    3.30 cm 2.53 cm/m   AORTA                 Normals Ao Root diam: 2.80 cm 31 mm  SHUNTS Systemic Diam: 1.70 cm  Charlton Haws MD Electronically signed by Charlton Haws MD Signature Date/Time: 01/19/2019/2:10:34 PM    Final     Cardiac Studies   none  Patient Profile     74 y.o. female admitted with GI bleeding and an ischemic CM.   Assessment & Plan    1. NSTEMI - due to GI bleeding. No plans for additional evaluation. We will sign off.   2. Acute GI bleeding - she needs transfusion and GI eval.  [facility] will sign off.   Medication Recommendations:  Stop all blood thinners until GI service tells [place] when we can restart ASA Other recommendations (labs, testing, etc):  none Follow up as an outpatient:  followup with Dr. Darl Householder in 3-4 weeks  For questions or updates, please contact [facility] Please consult [email or web address] for contact info under Cardiology/STEMI.      Signed, Lewayne Bunting, MD  01/21/2019, 9:24 AM  Patient ID: [name], female   DOB: [birth year], 74 y.o.   MRN: [id number]

## 2019-01-21 NOTE — Consult Note (Addendum)
Referring Provider:  [facility]         Primary Care Physician:  [name], MD Primary Gastroenterologist: Jonette Eva, MD             We were asked to see this patient for:    Anemia / FOBT+              ASSESSMENT /  PLAN      74 yo female with pmh significant for but not limited to CAD / remote CABG / PCI / combined systolic and diastolic CHF, HTN, CKD3, COPD, hyperlipidemia, GERD, dementia  1. 74 yo female with normocytic anemia, FOBT+ in setting of chronic Goody powder use and dual anti-platelet therapy since November (NSTEMI). Rule out erosive disease / PUD in setting of heavy NSAID use.  --hgb 8 today, up from 6.7 after 2 units of blood --Agree with BID PPI --Trend H+H --Will schedule for EGD to be done tomorrow. The risks and benefits of EGD were discussed and the patient. She would like to discuss with husband before signing consent.   2. CAD / CABG. NSTEMI in November. No acute changes on cardiac cath. Decision made to treat medically.   3. Combined systolic diastolic heart failure. EF 25-30%   HPI:    Chief Complaint: anemia  Susan [name] is a 74 y.o. female who presented to [facility] ED on 01/17/18 with weakness x 2 days. She fell at home, sustained radius and ulna fracture s/p reduction in ED. Still some displacement on xrays. Her labs were remarkable for new anemia. In November patient had a NSTEMI. No acute changes on cardiac cath. Plan was to treat medically, subsequently started on asa + plavix. Since then her hgb has declined from 14.7 to 6.7,  she is FOBT+.  Transfused 2u blood. Hgb improved to 8.6 but down to 8 today.  Patient denies any rectal bleeding or black stools. Says she learned of blood being in stool while at [facility].  She chronically takes [place] powders 2-3 times a day. Patient has no abdominal pain. No nausea / vomiting. No bowel changes. Reports good appetitie   Past Medical History:  Diagnosis Date  . Acute on chronic systolic and  diastolic heart failure, NYHA class 1 (HCC) 11/21/2018  . Anxiety   . CAD (coronary artery disease)    a. s/p CABG x5 in 2000 b. cath in 02/2017 showing 2/5 patent grafts with patent LIMA-LAD, patent SVG-distal RCA, occluded SVG-D1 and occluded seq-SVG-OM1-OM2 with medical management recommended and possible PCI of distal RCA branches in the future if recurrent anginal symptoms  . CHF (congestive heart failure) (HCC)    a. EF 20-25% by echo in 12/2016 b. EF 45-50% by echo in 05/2017 c. EF 30% by echo in 11/2018  . Chronic bronchitis (HCC)   . CKD (chronic kidney disease) stage 3, GFR 30-59 ml/min 11/21/2018  . COPD (chronic obstructive pulmonary disease) (HCC)   . Esophageal reflux   . Essential hypertension   . Fatigue   . HLD (hyperlipidemia) 11/21/2018  . HTN (hypertension) 11/21/2018  . Hypercholesteremia   . Ovarian failure     Past Surgical History:  Procedure Laterality Date  . ABDOMINAL HYSTERECTOMY    . APPENDECTOMY    . BREAST BIOPSY Right   . CARDIAC CATHETERIZATION  2000  . CORONARY ARTERY BYPASS GRAFT  2000   CABG X5  . LEFT HEART CATH AND CORS/GRAFTS ANGIOGRAPHY N/A 02/17/2017   Procedure: LEFT HEART CATH AND CORS/GRAFTS  ANGIOGRAPHY;  Surgeon: McAlhany, Christopher D, MD;  Location: [facility] INVASIVE CV LAB;  Service: Cardiovascular;  Laterality: N/A;  . LEFT HEART CATH AND CORS/GRAFTS ANGIOGRAPHY N/A 11/20/2018   Procedure: LEFT HEART CATH AND CORS/GRAFTS ANGIOGRAPHY;  Surgeon: End, Christopher, MD;  Location: [facility] INVASIVE CV LAB;  Service: Cardiovascular;  Laterality: N/A;    Prior to Admission medications   Medication Sig Start Date End Date Taking? Authorizing Provider  aspirin EC 81 MG tablet Take 81 mg by mouth daily.   Yes [provider]  clopidogrel (PLAVIX) 75 MG tablet Take 1 tablet (75 mg total) by mouth daily with breakfast. 11/22/18  Yes Ingold, Laura R, NP  furosemide (LASIX) 40 MG tablet Take 1 tablet (40 mg total) by mouth daily. (may take extra as  needed for weight gain of 3 pounds in 1 day or 5 pounds in 1 week) 12/11/18  Yes Koneswaran, Suresh A, MD  hydrALAZINE (APRESOLINE) 25 MG tablet Take 1 tablet (25 mg total) by mouth 3 (three) times daily. 12/11/18  Yes Koneswaran, Suresh A, MD  isosorbide mononitrate (IMDUR) 30 MG 24 hr tablet Take 0.5 tablets (15 mg total) by mouth daily. 11/22/18  Yes Ingold, Laura R, NP  metoprolol succinate (TOPROL-XL) 25 MG 24 hr tablet Take 0.5 tablets (12.5 mg total) by mouth daily. 11/22/18  Yes Ingold, Laura R, NP  Multiple Vitamin (MULTIVITAMIN) tablet Take 1 tablet by mouth daily.   Yes [provider]  nitroGLYCERIN (NITROSTAT) 0.4 MG SL tablet Place 1 tablet (0.4 mg total) under the tongue every 5 (five) minutes x 3 doses as needed for chest pain. 02/18/17  Yes Kilroy, Luke K, PA-C  potassium chloride SA (KLOR-CON M20) 20 MEQ tablet Take 1 tablet (20 mEq total) by mouth daily. (may take one extra tab when taking extra Lasix) 12/11/18  Yes Koneswaran, Suresh A, MD  rosuvastatin (CRESTOR) 5 MG tablet Take 5 mg by mouth daily. 12/22/18  Yes [provider]  vitamin B-12 (CYANOCOBALAMIN) 250 MCG tablet Take 250 mcg by mouth daily.   Yes [provider]    Current Facility-Administered Medications  Medication Dose Route Frequency Provider Last Rate Last Admin  . 0.9 %  sodium chloride infusion (Manually program via Guardrails IV Fluids)   Intravenous Once Lama, Gagan S, MD      . 0.9 %  sodium chloride infusion (Manually program via Guardrails IV Fluids)   Intravenous Once Johnson, Clanford L, MD      . 0.9 %  sodium chloride infusion  250 mL Intravenous PRN Lama, Gagan S, MD      . 0.9 %  sodium chloride infusion   Intravenous Continuous Gill, Eric A, NP 20 mL/hr at 01/20/19 1739 Rate Verify at 01/20/19 1739  . acetaminophen (TYLENOL) tablet 650 mg  650 mg Oral Q6H Brown, Carina M, MD   650 mg at 01/20/19 2206  . furosemide (LASIX) tablet 40 mg  40 mg Oral Daily Brown, Carina M, MD    40 mg at 01/21/19 0944  . hydrALAZINE (APRESOLINE) tablet 25 mg  25 mg Oral TID Lama, Gagan S, MD   25 mg at 01/21/19 0945  . isosorbide mononitrate (IMDUR) 24 hr tablet 15 mg  15 mg Oral Daily Lama, Gagan S, MD   15 mg at 01/21/19 0942  . metoprolol succinate (TOPROL-XL) 24 hr tablet 12.5 mg  12.5 mg Oral Daily Lama, Gagan S, MD   12.5 mg at 01/21/19 0945  . nitroGLYCERIN (NITROSTAT) SL tablet 0.4   mg  0.4 mg Sublingual Q5 Min x 3 PRN Meredeth Ide, MD      . ondansetron [facility]) tablet 4 mg  4 mg Oral Q6H PRN Meredeth Ide, MD       Or  . ondansetron (ZOFRAN) injection 4 mg  4 mg Intravenous Q6H PRN Meredeth Ide, MD      . oxyCODONE (Oxy IR/ROXICODONE) immediate release tablet 5 mg  5 mg Oral Q6H PRN Jonette Eva L, MD   5 mg at 01/20/19 0740  . pantoprazole (PROTONIX) injection 40 mg  40 mg Intravenous Q12H Westley Chandler, MD   40 mg at 01/21/19 1610  . rosuvastatin (CRESTOR) tablet 5 mg  5 mg Oral Daily Meredeth Ide, MD   5 mg at 01/21/19 0944  . sodium chloride flush (NS) 0.9 % injection 3 mL  3 mL Intravenous Q12H Meredeth Ide, MD   3 mL at 01/21/19 0946  . sodium chloride flush (NS) 0.9 % injection 3 mL  3 mL Intravenous PRN Meredeth Ide, MD        Allergies as of 01/18/2019  . (No Known Allergies)    Family History  Problem Relation Age of Onset  . Heart attack Mother   . Hypertension Father     Social History   Socioeconomic History  . Marital status: Married    Spouse name: Not on file  . Number of children: Not on file  . Years of education: Not on file  . Highest education level: Not on file  Occupational History  . Not on file  Tobacco Use  . Smoking status: Current Every Day Smoker    Packs/day: 1.00    Years: 50.00    Pack years: 50.00    Types: Cigarettes    Last attempt to quit: 02/22/2017    Years since quitting: 1.9  . Smokeless tobacco: Never Used  Substance and Sexual Activity  . Alcohol use: No  . Drug use: No  . Sexual activity: Never    Other Topics Concern  . Not on file  Social History Narrative  . Not on file   Social Determinants of Health   Financial Resource Strain:   . Difficulty of Paying Living Expenses: Not on file  Food Insecurity:   . Worried About Programme researcher, broadcasting/film/video in the Last Year: Not on file  . Ran Out of Food in the Last Year: Not on file  Transportation Needs:   . Lack of Transportation (Medical): Not on file  . Lack of Transportation (Non-Medical): Not on file  Physical Activity:   . Days of Exercise per Week: Not on file  . Minutes of Exercise per Session: Not on file  Stress:   . Feeling of Stress : Not on file  Social Connections:   . Frequency of Communication with Friends and Family: Not on file  . Frequency of Social Gatherings with Friends and Family: Not on file  . Attends Religious Services: Not on file  . Active Member of Clubs or Organizations: Not on file  . Attends Banker Meetings: Not on file  . Marital Status: Not on file  Intimate Partner Violence:   . Fear of Current or Ex-Partner: Not on file  . Emotionally Abused: Not on file  . Physically Abused: Not on file  . Sexually Abused: Not on file    Review of Systems: All systems reviewed and negative except where noted in HPI.  Physical Exam:  Vital signs in last 24 hours: Temp:  [97.9 F (36.6 C)-99.5 F (37.5 C)] 97.9 F (36.6 C) (01/10 0601) Pulse Rate:  [79-96] 79 (01/10 0601) Resp:  [19-28] 20 (01/10 0601) BP: (113-118)/(67-102) 118/68 (01/10 0601) SpO2:  [91 %-96 %] 96 % (01/10 0601) Weight:  [39.1 kg] 39.1 kg (01/10 0601) Last BM Date: 01/19/19 General:   Alert, thin female in NAD Psych:  Pleasant, cooperative. Normal mood and affect. Eyes:  Pupils equal, sclera clear, no icterus.   Conjunctiva pink. Ears:  Normal auditory acuity. Nose:  No deformity, discharge,  or lesions. Neck:  Supple; no masses Lungs:  Clear throughout to auscultation.   No wheezes, crackles, or rhonchi.  Heart:   Regular rate and rhythm;  no lower extremity edema Abdomen:  Soft, non-distended, nontender, BS active, no palp mass   Rectal:  Deferred  Msk:  Symmetrical without gross deformities. . Neurologic:  Alert and  Oriented, grossly normal neurologically. Skin:  Intact without significant lesions or rashes.   Intake/Output from previous day: 01/09 0701 - 01/10 0700 In: 1068.8 [P.O.:840; I.V.:228.8] Out: 200 [Urine:200] Intake/Output this shift: Total I/O In: -  Out: 350 [Urine:350]  Lab Results: Recent Labs    01/20/19 0752 01/20/19 1816 01/21/19 0140  WBC 10.7* 11.0* 9.3  HGB 9.1* 8.6* 8.0*  HCT 27.0* 25.9* 24.4*  PLT 143* 138* 134*   BMET Recent Labs    01/19/19 0335 01/20/19 0752 01/21/19 0140  NA 134* 134* 131*  K 3.9 3.6 3.3*  CL 103 97* 99  CO2 22 27 25   GLUCOSE 138* 141* 119*  BUN 53* 32* 20  CREATININE 1.11* 1.31* 1.17*  CALCIUM 8.0* 8.2* 7.8*   LFT Recent Labs    01/20/19 0752  PROT 5.1*  ALBUMIN 2.6*  AST 148*  ALT 29  ALKPHOS 39  BILITOT 0.8  BILIDIR 0.2  IBILI 0.6    Hepatitis Panel No results for input(s): HEPBSAG, HCVAB, HEPAIGM, HEPBIGM in the last 72 hours.   . CBC Latest Ref Rng & Units 01/21/2019 01/20/2019 01/20/2019  WBC 4.0 - 10.5 K/uL 9.3 11.0(H) 10.7(H)  Hemoglobin 12.0 - 15.0 g/dL 8.0(L) 8.6(L) 9.1(L)  Hematocrit 36.0 - 46.0 % 24.4(L) 25.9(L) 27.0(L)  Platelets 150 - 400 K/uL 134(L) 138(L) 143(L)    . CMP Latest Ref Rng & Units 01/21/2019 01/20/2019 01/19/2019  Glucose 70 - 99 mg/dL 791(T) 056(P) 794(I)  BUN 8 - 23 mg/dL 20 01(K) 55(V)  Creatinine 0.44 - 1.00 mg/dL 7.48(O) 7.07(E) 6.75(Q)  Sodium 135 - 145 mmol/L 131(L) 134(L) 134(L)  Potassium 3.5 - 5.1 mmol/L 3.3(L) 3.6 3.9  Chloride 98 - 111 mmol/L 99 97(L) 103  CO2 22 - 32 mmol/L 25 27 22   Calcium 8.9 - 10.3 mg/dL 7.8(L) 8.2(L) 8.0(L)  Total Protein 6.5 - 8.1 g/dL - 5.1(L) 5.0(L)  Total Bilirubin 0.3 - 1.2 mg/dL - 0.8 0.6  Alkaline Phos 38 - 126 U/L - 39 36(L)  AST 15 - 41  U/L - 148(H) 57(H)  ALT 0 - 44 U/L - 29 16   Studies/Results: ECHOCARDIOGRAM LIMITED  Result Date: 01/19/2019   ECHOCARDIOGRAM LIMITED REPORT   Patient Name:   Susan [name] Date of Exam: 01/19/2019 Medical Rec #:  [id number]      Height:       60.0 in Accession #:    [id number]     Weight:       86.0 lb Date of Birth:  [birth year]       BSA:  1.30 m Patient Age:    73 years       BP:           159/82 mmHg Patient Gender: F              HR:           105 bpm. [facility]  Procedure: 2D Echo, Cardiac Doppler and Color Doppler Indications:    NSTEMI / Limited Study eval LVEF and WMA  History:        Patient has prior history of Echocardiogram examinations, most                 recent 11/19/2018. Smoker.  Sonographer:    Celesta Gentile RCS Referring Phys: [id number] Ellsworth Lennox IMPRESSIONS  1. Left ventricular ejection fraction, by visual estimation, is 25 to 30%. The left ventricle has severely decreased function. Left ventricular septal wall thickness was normal.  2. Left ventricular diastolic function could not be evaluated.  3. Severely dilated left ventricular internal cavity size.  4. 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  5. Global right ventricle has normal systolc function.The right ventricular size is normal. no increase in right ventricular wall thickness.  6. Left atrial size was mildly dilated.  7. The tricuspid valve was not assessed.  8. Mild aortic valve sclerosis without stenosis.  9. Aortic root could not be assessed. 10. ? prominent chiarri network at dome of RA in 4 chamber view. FINDINGS  Left Ventricle: Left ventricular ejection fraction, by visual estimation, is 25 to 30%. The left ventricle has severely decreased function. Severe hypokinesis of the left ventricular, apical septal wall, anteroseptal wall and inferoseptal wall. Severe hypokinesis of the left  ventricular, entire inferior wall. The left ventricular internal cavity size was severely dilated left ventricle. Left ventricular septal wall thickness was normal. Left ventricular diastolic function could not be evaluated. 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 Right Ventricle: The right ventricular size is normal. No increase in right ventricular wall thickness. Global RV systolic function is has normal systolic function. Left Atrium: Left atrial size was mildly dilated. Right Atrium: Right atrial size was normal in size. Right atrial pressure is estimated at 10 mmHg. ? prominent chiarri network at dome of RA in 4 chamber view. Pericardium: There is no evidence of pericardial effusion is seen. There is no evidence of pericardial effusion. Tricuspid Valve: The tricuspid valve is not assessed. Aortic Valve: The aortic valve is tricuspid. Mild aortic valve sclerosis is present, with no evidence of aortic valve stenosis. Pulmonic Valve: The pulmonic valve was not assessed. Aorta: Aortic root could not be assessed. Shunts: No atrial level shunt detected by color flow Doppler.  LEFT VENTRICLE          Normals PLAX 2D LVIDd:         4.85 cm  3.6 cm LVIDs:         4.40 cm  1.7 cm LV PW:         0.81 cm  1.4 cm LV IVS:        1.01 cm  1.3 cm LVOT diam:     1.70 cm  2.0 cm LV SV:         22 ml    79 ml LV SV Index:  17.59    45 ml/m2 LVOT Area:     2.27 cm 3.14 cm2  LV Volumes (MOD)             Normals LV area d, A2C:    26.20 cm LV area d, A4C:    31.00 cm LV area s, A2C:    22.10 cm LV area s, A4C:    26.30 cm LV major d, A2C:   7.15 cm LV major d, A4C:   7.32 cm LV major s, A2C:   6.60 cm LV major s, A4C:   6.85 cm LV vol d, MOD A2C: 81.4 ml   68 ml LV vol d, MOD A4C: 108.0 ml LV vol s, MOD A2C: 60.5 ml   24 ml LV vol s, MOD A4C: 84.3 ml LV SV MOD A2C:     20.9 ml LV SV MOD A4C:     108.0 ml LV SV MOD  BP:      21.9 ml   45 ml LEFT ATRIUM         Index LA diam:    3.30 cm 2.53 cm/m   AORTA                 Normals Ao Root diam: 2.80 cm 31 mm  SHUNTS Systemic Diam: 1.70 cm  Jenkins Rouge MD Electronically signed by Jenkins Rouge MD Signature Date/Time: 01/19/2019/2:10:34 PM    Final     Active Problems:   Hx of CABG   SEMI (subendocardial myocardial infarction) ([place])   GI bleed   Acute upper GI bleed    Tye Savoy, NP-C @  01/21/2019, 12:30 PM  GI ATTENDING  History, laboratories, x-rays reviewed.  Patient seen and examined.  Agree with comprehensive consultation note as outlined above.  Patient with multiple significant medical problems as outlined above who presented to [facility] with symptomatic anemia and on demand ischemia.  The gastroenterologist recommended transfer to [facility] for available cardiology services and endoscopy.  No overt GI bleeding but significant interval change in hemoglobin.  In addition aspirin and Plavix she has been on multiple NSAIDs in the form of headache powders as an outpatient.  She was Hemoccult positive.  She was seen by cardiology who felt that this was on demand ischemia.  Cleared for EGD.  Feeling better posttransfusion.  On PPI.  Plan for upper endoscopy in a.m.  With Dr. Hilarie Fredrickson. The nature of the procedure, as well as the risks, benefits, and alternatives were carefully and thoroughly reviewed with the patient. Ample time for discussion and questions allowed. The patient understood, was satisfied, and agreed to proceed.  The patient is HIGH RISK given her age, comorbidities, and recent non-STEMI.  Would imagine that she could be discharged sometime thereafter.  She would resume her subsequent GI care with Dr. Oneida Alar.  Docia Chuck. Geri Seminole., M.D. [facility] Division of Gastroenterology

## 2019-01-22 ENCOUNTER — Inpatient Hospital Stay (HOSPITAL_COMMUNITY): Payer: Medicare PPO | Admitting: Anesthesiology

## 2019-01-22 ENCOUNTER — Encounter (HOSPITAL_COMMUNITY): Payer: Self-pay | Admitting: Family Medicine

## 2019-01-22 ENCOUNTER — Encounter (HOSPITAL_COMMUNITY)
Admission: EM | Disposition: A | Discharge status: Home / Self Care | Payer: Self-pay | Source: Home / Self Care | Attending: Internal Medicine

## 2019-01-22 DIAGNOSIS — K2901 Acute gastritis with bleeding: Secondary | ICD-10-CM

## 2019-01-22 DIAGNOSIS — K2970 Gastritis, unspecified, without bleeding: Secondary | ICD-10-CM

## 2019-01-22 DIAGNOSIS — K250 Acute gastric ulcer with hemorrhage: Secondary | ICD-10-CM

## 2019-01-22 HISTORY — PX: BIOPSY: SHX5522

## 2019-01-22 HISTORY — PX: ESOPHAGOGASTRODUODENOSCOPY (EGD) WITH PROPOFOL: SHX5813

## 2019-01-22 LAB — COMPREHENSIVE METABOLIC PANEL
ALT: 31 U/L (ref 0–44)
AST: 58 U/L — ABNORMAL HIGH (ref 15–41)
Albumin: 2.3 g/dL — ABNORMAL LOW (ref 3.5–5.0)
Alkaline Phosphatase: 49 U/L (ref 38–126)
Anion gap: 8 (ref 5–15)
BUN: 15 mg/dL (ref 8–23)
CO2: 31 mmol/L (ref 22–32)
Calcium: 7.9 mg/dL — ABNORMAL LOW (ref 8.9–10.3)
Chloride: 95 mmol/L — ABNORMAL LOW (ref 98–111)
Creatinine, Ser: 1.2 mg/dL — ABNORMAL HIGH (ref 0.44–1.00)
GFR calc Af Amer: 52 mL/min — ABNORMAL LOW (ref >60)
GFR calc non Af Amer: 44 mL/min — ABNORMAL LOW (ref >60)
Glucose, Bld: 105 mg/dL — ABNORMAL HIGH (ref 70–99)
Potassium: 2.8 mmol/L — ABNORMAL LOW (ref 3.5–5.1)
Sodium: 134 mmol/L — ABNORMAL LOW (ref 135–145)
Total Bilirubin: 1 mg/dL (ref 0.3–1.2)
Total Protein: 5.1 g/dL — ABNORMAL LOW (ref 6.5–8.1)

## 2019-01-22 LAB — POCT I-STAT, CHEM 8
BUN: 13 mg/dL (ref 8–23)
Calcium, Ion: 1.06 mmol/L — ABNORMAL LOW (ref 1.15–1.40)
Chloride: 95 mmol/L — ABNORMAL LOW (ref 98–111)
Creatinine, Ser: 1.1 mg/dL — ABNORMAL HIGH (ref 0.44–1.00)
Glucose, Bld: 107 mg/dL — ABNORMAL HIGH (ref 70–99)
HCT: 25 % — ABNORMAL LOW (ref 36.0–46.0)
Hemoglobin: 8.5 g/dL — ABNORMAL LOW (ref 12.0–15.0)
Potassium: 4.1 mmol/L (ref 3.5–5.1)
Sodium: 131 mmol/L — ABNORMAL LOW (ref 135–145)
TCO2: 29 mmol/L (ref 22–32)

## 2019-01-22 LAB — CBC
HCT: 25.3 % — ABNORMAL LOW (ref 36.0–46.0)
Hemoglobin: 8.5 g/dL — ABNORMAL LOW (ref 12.0–15.0)
MCH: 31.3 pg (ref 26.0–34.0)
MCHC: 33.6 g/dL (ref 30.0–36.0)
MCV: 93 fL (ref 80.0–100.0)
Platelets: 188 10*3/uL (ref 150–400)
RBC: 2.72 MIL/uL — ABNORMAL LOW (ref 3.87–5.11)
RDW: 17.2 % — ABNORMAL HIGH (ref 11.5–15.5)
WBC: 7.4 10*3/uL (ref 4.0–10.5)
nRBC: 0.5 % — ABNORMAL HIGH (ref 0.0–0.2)

## 2019-01-22 LAB — MAGNESIUM: Magnesium: 1.8 mg/dL (ref 1.7–2.4)

## 2019-01-22 SURGERY — ESOPHAGOGASTRODUODENOSCOPY (EGD) WITH PROPOFOL
Anesthesia: Monitor Anesthesia Care

## 2019-01-22 MED ORDER — PANTOPRAZOLE SODIUM 40 MG PO TBEC
40.0000 mg | DELAYED_RELEASE_TABLET | Freq: Two times a day (BID) | ORAL | Status: DC
  Administered 2019-01-22 – 2019-01-24 (×4): 40 mg via ORAL
  Filled 2019-01-22 (×4): qty 1

## 2019-01-22 MED ORDER — POTASSIUM CHLORIDE 10 MEQ/100ML IV SOLN
10.0000 meq | INTRAVENOUS | Status: DC
  Administered 2019-01-22: 10 meq via INTRAVENOUS
  Filled 2019-01-22: qty 100

## 2019-01-22 MED ORDER — POTASSIUM CHLORIDE CRYS ER 10 MEQ PO TBCR
40.0000 meq | EXTENDED_RELEASE_TABLET | ORAL | Status: AC
  Administered 2019-01-22: 40 meq via ORAL
  Filled 2019-01-22: qty 4

## 2019-01-22 MED ORDER — PROPOFOL 10 MG/ML IV BOLUS
INTRAVENOUS | Status: DC | PRN
  Administered 2019-01-22 (×3): 15 mg via INTRAVENOUS

## 2019-01-22 MED ORDER — LACTATED RINGERS IV SOLN: INTRAVENOUS | Status: DC

## 2019-01-22 MED ORDER — LORAZEPAM 0.5 MG PO TABS
0.5000 mg | ORAL_TABLET | Freq: Every day | ORAL | Status: AC
  Administered 2019-01-22: 0.5 mg via ORAL
  Filled 2019-01-22: qty 1

## 2019-01-22 MED ORDER — PROPOFOL 500 MG/50ML IV EMUL
INTRAVENOUS | Status: DC | PRN
  Administered 2019-01-22: 50 ug/kg/min via INTRAVENOUS

## 2019-01-22 SURGICAL SUPPLY — 15 items

## 2019-01-22 NOTE — Anesthesia Postprocedure Evaluation (Signed)
Anesthesia Post Note  Patient: Susan [name]  Procedure(s) Performed: ESOPHAGOGASTRODUODENOSCOPY (EGD) WITH PROPOFOL (N/A ) BIOPSY     Patient location during evaluation: PACU Anesthesia Type: MAC Level of consciousness: awake and alert Pain management: pain level controlled Vital Signs Assessment: post-procedure vital signs reviewed and stable Respiratory status: spontaneous breathing, nonlabored ventilation and respiratory function stable Cardiovascular status: stable and blood pressure returned to baseline Anesthetic complications: no    Last Vitals:  Vitals:   01/22/19 1515 01/22/19 1521  BP: 124/77 (!) 151/83  Pulse: 80 82  Resp: (!) 29 (!) 29  Temp:    SpO2: 97% 99%    Last Pain:  Vitals:   01/22/19 1505  TempSrc: Temporal  PainSc: 0-No pain                 Audry Pili

## 2019-01-22 NOTE — Interval H&P Note (Signed)
History and Physical Interval Note: For EGD today to evaluate dark heme positive stool and acute anemia. Plavix and aspirin have been on hold Hemoglobin is stable at 8.5 BUN was markedly elevated and has normalized, high suspicion for recent upper GI bleeding The nature of the procedure, as well as the risks, benefits, and alternatives were carefully and thoroughly reviewed with the patient. Ample time for discussion and questions allowed. The patient understood, was satisfied, and agreed to proceed.    01/22/2019 1:54 PM  Susan [name]  has presented today for surgery, with the diagnosis of anemia, hemoccult positive stool.  The various methods of treatment have been discussed with the patient and family. After consideration of risks, benefits and other options for treatment, the patient has consented to  Procedure(s): ESOPHAGOGASTRODUODENOSCOPY (EGD) WITH PROPOFOL (N/A) as a surgical intervention.  The patient's history has been reviewed, patient examined, no change in status, stable for surgery.  I have reviewed the patient's chart and labs.  Questions were answered to the patient's satisfaction.     Carie Caddy Melisa Donofrio

## 2019-01-22 NOTE — Progress Notes (Addendum)
PROGRESS NOTE    Susan [name]  OJJ:[id number] DOB: 1945-04-10 DOA: 01/18/2019 PCP: [name], MD   Brief Narrative:  74 year old woman with history of coronary artery disease status post CABG on dual antiplatelet therapy presenting with acute anemia likely secondary to GI bleed.  The patient initially presented to [facility] with fatigue and weakness over the past month.  She endorses some black stools and had a positive FOBT.  She is transferred to [facility] for further evaluation with gastroenterology here.  Cardiology was also consulted. Going for EGD today.  Subjective: Patient has no new complaints today .  Denies any nausea, vomiting or abdominal pain.  Assessment & Plan:   Active Problems:   Hx of CABG   SEMI (subendocardial myocardial infarction) (HCC)   GI bleed   Acute upper GI bleed  Anemia secondary to GI bleed.  Patient was on dual antiplatelet and using Goody powder quite frequently.  Most likely a combination of dual antiplatelet and chronic NSAID use. Hemoglobin improved to 8.5 today after 2 units. GI was consulted and patient is going for EGD today.  Hypokalemia.  Potassium of 2.8 with magnesium of 1.8. -Replete electrolytes and monitor.  History of CAD/NSTEMI.  Troponin peaked at 4940.  Please secondary to demand with acute GI bleed. Cardiology was consulted and they do not think that she needs any further intervention-recommending medical management. -Continue M. Doerr, metoprolol and Crestor. -Hold aspirin and Plavix.  Heart failure with reduced ejection fraction. repeat echo 01/19/2019 with EF of 25 to 30% with findings consistent with multivessel CAD. -Does not appear volume overload. -Continue with daily Lasix 40 mg.  Distal left radius and ulnar facture.  Secondary to mechanical fall.  No current issue.  She will follow-up with orthopedic. -Tylenol and oxycodone for pain. -Should get bisphosphonate as an outpatient-PCP can manage.  CKD II.  baseline around 1.1. Return in stable around baseline. -Nephrotoxic and monitor.  Objective: Vitals:   01/21/19 1623 01/21/19 1625 01/21/19 1948 01/22/19 1249  BP: 122/83 122/83 (!) 109/56 (!) 170/92  Pulse: 83  78 88  Resp: 18 (!) 21 19 (!) 28  Temp: 97.8 F (36.6 C)  99 F (37.2 C) 98.1 F (36.7 C)  TempSrc: Oral  Oral Temporal  SpO2: 99%  96% 95%  Weight:    36.3 kg  Height:    5' (1.524 m)    Intake/Output Summary (Last 24 hours) at 01/22/2019 1426 Last data filed at 01/22/2019 0548 Gross per 24 hour  Intake 240 ml  Output 1100 ml  Net -860 ml   Filed Weights   01/18/19 1251 01/21/19 0601 01/22/19 1249  Weight: 39 kg 39.1 kg 36.3 kg    Examination:  General exam: Pleasant and very emaciated elderly lady,appears calm and comfortable  Respiratory system: Clear to auscultation. Respiratory effort normal. Cardiovascular system: S1 & S2 heard, RRR. No JVD, murmurs, rubs, gallops or clicks. Gastrointestinal system: Soft, nontender, nondistended, bowel sounds positive. Central nervous system: Alert and oriented. No focal neurological deficits.Symmetric 5 x 5 power. Extremities: No edema, no cyanosis, pulses intact and symmetrical.  Left forearm with Ace wrap. Skin: No rashes, lesions or ulcers Psychiatry: Judgement and insight appear normal. Mood & affect appropriate.   DVT prophylaxis: SCDs. Code Status: Full Family Communication: Son was updated at bedside. Disposition Plan: Pending GI recommendations after EGD.  Consultants:   GI  Procedures:  EGD  Antimicrobials:   Data Reviewed: I have personally reviewed following labs and imaging studies  CBC: Recent Labs  Lab 01/19/19 0335 01/20/19 0752 01/20/19 1816 01/21/19 0140 01/22/19 0349 01/22/19 1259  WBC 10.3 10.7* 11.0* 9.3 7.4  --   HGB 6.7* 9.1* 8.6* 8.0* 8.5* 8.5*  HCT 20.7* 27.0* 25.9* 24.4* 25.3* 25.0*  MCV 101.5* 90.9 92.2 92.8 93.0  --   PLT 180 143* 138* 134* 188  --    Basic Metabolic  Panel: Recent Labs  Lab 01/18/19 1450 01/19/19 0335 01/20/19 0752 01/21/19 0140 01/22/19 0329 01/22/19 0349 01/22/19 1259  NA 135 134* 134* 131*  --  134* 131*  K 3.9 3.9 3.6 3.3*  --  2.8* 4.1  CL 102 103 97* 99  --  95* 95*  CO2 23 22 27 25   --  31  --   GLUCOSE 117* 138* 141* 119*  --  105* 107*  BUN 64* 53* 32* 20  --  15 13  CREATININE 1.09* 1.11* 1.31* 1.17*  --  1.20* 1.10*  CALCIUM 8.6* 8.0* 8.2* 7.8*  --  7.9*  --   MG  --   --   --   --  1.8  --   --    GFR: Estimated Creatinine Clearance: 25.7 mL/min (A) (by C-G formula based on SCr of 1.1 mg/dL (H)). Liver Function Tests: Recent Labs  Lab 01/18/19 1450 01/19/19 0335 01/20/19 0752 01/22/19 0349  AST 20 57* 148* 58*  ALT 15 16 29 31   ALKPHOS 46 36* 39 49  BILITOT 0.6 0.6 0.8 1.0  PROT 6.0* 5.0* 5.1* 5.1*  ALBUMIN 3.2* 2.6* 2.6* 2.3*   No results for input(s): LIPASE, AMYLASE in the last 168 hours. No results for input(s): AMMONIA in the last 168 hours. Coagulation Profile: No results for input(s): INR, PROTIME in the last 168 hours. Cardiac Enzymes: No results for input(s): CKTOTAL, CKMB, CKMBINDEX, TROPONINI in the last 168 hours. BNP (last 3 results) No results for input(s): PROBNP in the last 8760 hours. HbA1C: No results for input(s): HGBA1C in the last 72 hours. CBG: Recent Labs  Lab 01/18/19 1258  GLUCAP 153*   Lipid Profile: No results for input(s): CHOL, HDL, LDLCALC, TRIG, CHOLHDL, LDLDIRECT in the last 72 hours. Thyroid Function Tests: No results for input(s): TSH, T4TOTAL, FREET4, T3FREE, THYROIDAB in the last 72 hours. Anemia Panel: No results for input(s): VITAMINB12, FOLATE, FERRITIN, TIBC, IRON, RETICCTPCT in the last 72 hours. Sepsis Labs: No results for input(s): PROCALCITON, LATICACIDVEN in the last 168 hours.  Recent Results (from the past 240 hour(s))  SARS CORONAVIRUS 2 (TAT 6-24 HRS) Nasopharyngeal Nasopharyngeal Swab     Status: None   Collection Time: 01/18/19  5:25 PM     Specimen: Nasopharyngeal Swab  Result Value Ref Range Status   SARS Coronavirus 2 NEGATIVE NEGATIVE Final    Comment: (NOTE) SARS-CoV-2 target nucleic acids are NOT DETECTED. The SARS-CoV-2 RNA is generally detectable in upper and lower respiratory specimens during the acute phase of infection. Negative results do not preclude SARS-CoV-2 infection, do not rule out co-infections with other pathogens, and should not be used as the sole basis for treatment or other patient management decisions. Negative results must be combined with clinical observations, patient history, and epidemiological information. The expected result is Negative. Fact Sheet for Patients: 03/18/19 Fact Sheet for Healthcare Providers: 03/18/19 This test is not yet approved or cleared by the [employer] FDA and  has been authorized for detection and/or diagnosis of SARS-CoV-2 by FDA under an Emergency Use Authorization (EUA). This EUA will  remain  in effect (meaning this test can be used) for the duration of the COVID-19 declaration under Section 56 4(b)(1) of the Act, 21 U.S.C. section 360bbb-3(b)(1), unless the authorization is terminated or revoked sooner. Performed at [facility], [address] [place], [place] [address]      Radiology Studies: No results found.  Scheduled Meds: . [MAR Hold] acetaminophen  650 mg Oral Q6H  . [MAR Hold] furosemide  40 mg Oral Daily  . [MAR Hold] hydrALAZINE  25 mg Oral TID  . [MAR Hold] isosorbide mononitrate  15 mg Oral Daily  . [MAR Hold] metoprolol succinate  12.5 mg Oral Daily  . [MAR Hold] pantoprazole (PROTONIX) IV  40 mg Intravenous Q12H  . [MAR Hold] potassium chloride  40 mEq Oral Q4H  . [MAR Hold] rosuvastatin  5 mg Oral Daily   Continuous Infusions: . lactated ringers 20 mL/hr at 01/22/19 1253     LOS: 4 days   Time spent: 35 minutes  Arnetha Courser, MD [facility] Pager [phone]  If 7PM-7AM, please contact night-coverage [email or web address] Password [facility] 01/22/2019, 2:26 PM   This record has been created using Conservation officer, historic buildings. Errors have been sought and corrected,but may not always be located. Such creation errors do not reflect on the standard of care.

## 2019-01-22 NOTE — Anesthesia Preprocedure Evaluation (Addendum)
Anesthesia Evaluation  Patient identified by MRN, date of birth, ID band Patient awake    Reviewed: Allergy & Precautions, NPO status , Patient's Chart, lab work & pertinent test results  History of Anesthesia Complications Negative for: history of anesthetic complications  Airway Mallampati: III   Neck ROM: Full    Dental  (+) Upper Dentures, Lower Dentures   Pulmonary COPD,  COPD inhaler, Current Smoker and Patient abstained from smoking.,    Pulmonary exam normal        Cardiovascular hypertension, Pt. on home beta blockers and Pt. on medications (-) angina+ CAD, + Past MI, + CABG (2000) and +CHF  Normal cardiovascular exam   '21 TTE - EF 25 to 30%. Severely dilated LV internal cavity size. 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 LA was mildly dilated. Mild aortic valve sclerosis without stenosis. ? prominent chiarri network at dome of RA in 4 chamber view.  '20 Cath - 1.Overall stable appearance of the coronary arteries with severe three-vessel coronary artery disease, including chronic total occlusions of the mid LAD, mid LCx, and proximal RCA. 2. Widely patent LIMA-LAD. 3. Patent SVG-distal RCA with up to 50% stenosis in the mid graft.  Bifurcation disease involving the distal RCA is similar to prior catheterization, with 90% lesions involving the ostial/proximal rPDA and ostial/proximal RCA continuation. 4. Normal left ventricular filling pressure.    Neuro/Psych PSYCHIATRIC DISORDERS Anxiety negative neurological ROS     GI/Hepatic Neg liver ROS, GERD  , Elevated AST (isolated)    Endo/Other  negative endocrine ROS Hyponatremia Hypokalemia Hypocalcemia Hypochloremia   Renal/GU CRFRenal disease     Musculoskeletal negative musculoskeletal ROS (+)   Abdominal   Peds  Hematology  (+) anemia ,    Anesthesia Other Findings Covid neg 1/7  Reproductive/Obstetrics  Ovarian failure                             Anesthesia Physical Anesthesia Plan  ASA: III  Anesthesia Plan: MAC   Post-op Pain Management:    Induction: Intravenous  PONV Risk Score and Plan: 2 and Propofol infusion and Treatment may vary due to age or medical condition  Airway Management Planned: Nasal Cannula and Natural Airway  Additional Equipment: None  Intra-op Plan:   Post-operative Plan:   Informed Consent: I have reviewed the patients History and Physical, chart, labs and discussed the procedure including the risks, benefits and alternatives for the proposed anesthesia with the patient or authorized representative who has indicated his/her understanding and acceptance.       Plan Discussed with: CRNA and Anesthesiologist  Anesthesia Plan Comments:        Anesthesia Quick Evaluation

## 2019-01-22 NOTE — Procedures (Addendum)
Discussed with the patient's husband by phone following the procedure

## 2019-01-22 NOTE — Care Management Important Message (Signed)
Important Message  Patient Details  Name: Susan [name] MRN: [id number] Date of Birth: 1945/10/13   Medicare Important Message Given:  Yes     Renie Ora 01/22/2019, 1:07 PM

## 2019-01-22 NOTE — Op Note (Signed)
[facility] Patient Name: Susan [name] Procedure Date : 01/22/2019 MRN: [id number] Attending MD: Beverley Fiedler , MD Date of Birth: 06/20/1945 CSN: [id number] Age: 74 Admit Type: Inpatient Procedure:                Upper GI endoscopy Indications:              Acute post hemorrhagic anemia, Heme positive stool,                            recent use of aspirin (Goody's powder) Providers:                Carie Caddy. Rhea Belton, MD, Bonney Leitz, Kandice Robinsons,                            Technician, Wanita Chamberlain, Technician Referring MD:             [facility] Medicines:                Monitored Anesthesia Care Complications:            No immediate complications. Estimated Blood Loss:     Estimated blood loss was minimal. Procedure:                Pre-Anesthesia Assessment:                           - Prior to the procedure, a History and Physical                            was performed, and patient medications and                            allergies were reviewed. The patient's tolerance of                            previous anesthesia was also reviewed. The risks                            and benefits of the procedure and the sedation                            options and risks were discussed with the patient.                            All questions were answered, and informed consent                            was obtained. Prior Anticoagulants: The patient has                            taken Plavix (clopidogrel), last dose was 3 days                            prior to procedure. ASA Grade Assessment: III - A  patient with severe systemic disease. After                            reviewing the risks and benefits, the patient was                            deemed in satisfactory condition to undergo the                            procedure.                           After obtaining informed consent, the endoscope was              passed under direct vision. Throughout the                            procedure, the patient's blood pressure, pulse, and                            oxygen saturations were monitored continuously. The                            GIF-H190 ([id number] Olympus gastroscope was                            introduced through the mouth, and advanced to the                            second part of duodenum. The upper GI endoscopy was                            accomplished without difficulty. The patient                            tolerated the procedure well. Scope In: Scope Out: Findings:      The examined esophagus was normal.      Moderate inflammation characterized by congestion (edema), erythema and       shallow ulcerations was found in the gastric antrum and in the       prepyloric region of the stomach. Biopsies were taken with a cold       forceps for histology and Helicobacter pylori testing.      The examined duodenum was normal. Impression:               - Normal esophagus.                           - Ulcerative gastritis. Biopsied. Likely source of                            heme + stools and anemia in the setting of aspirin                            and Plavix.                           -  Normal examined duodenum. Moderate Sedation:      N/A Recommendation:           - Return patient to hospital ward for ongoing care.                           - Advance diet as tolerated.                           - Continue present medications.                           - BID PPI x 1 month, then daily thereafter.                           - Would hold Plavix x 1 week and if Hgb stable,                            then can resume though would do so in conjunction                            with PPI.                           - Await pathology results to exclude H. Pylori                            (though likely related to NSAIDs/ASA use).                           - GI will sign off,  call with questions. Procedure Code(s):        --- Professional ---                           [phone], Esophagogastroduodenoscopy, flexible,                            transoral; with biopsy, single or multiple Diagnosis Code(s):        --- Professional ---                           K29.70, Gastritis, unspecified, without bleeding                           D62, Acute posthemorrhagic anemia                           R19.5, Other fecal abnormalities CPT copyright 2019 American Medical Association. All rights reserved. The codes documented in this report are preliminary and upon coder review may  be revised to meet current compliance requirements. Beverley Fiedler, MD 01/22/2019 3:11:24 PM This report has been signed electronically. Number of Addenda: 0

## 2019-01-22 NOTE — Anesthesia Procedure Notes (Signed)
Procedure Name: MAC Date/Time: 01/22/2019 2:54 PM Performed by: Imagene Riches, CRNA Pre-anesthesia Checklist: Patient identified, Emergency Drugs available, Suction available, Patient being monitored and Timeout performed Patient Re-evaluated:Patient Re-evaluated prior to induction Oxygen Delivery Method: Nasal cannula Preoxygenation: Pre-oxygenation with 100% oxygen

## 2019-01-22 NOTE — Transfer of Care (Signed)
Immediate Anesthesia Transfer of Care Note  Patient: Susan [name]  Procedure(s) Performed: ESOPHAGOGASTRODUODENOSCOPY (EGD) WITH PROPOFOL (N/A ) BIOPSY  Patient Location: Endoscopy Unit  Anesthesia Type:MAC  Level of Consciousness: drowsy and patient cooperative  Airway & Oxygen Therapy: Patient Spontanous Breathing and Patient connected to nasal cannula oxygen  Post-op Assessment: Report given to RN and Post -op Vital signs reviewed and stable  Post vital signs: Reviewed and stable  Last Vitals:  Vitals Value Taken Time  BP    Temp    Pulse 84 01/22/19 1505  Resp 34 01/22/19 1505  SpO2 100 % 01/22/19 1505  Vitals shown include unvalidated device data.  Last Pain:  Vitals:   01/22/19 1249  TempSrc: Temporal  PainSc: 0-No pain      Patients Stated Pain Goal: 0 ([id number])  Complications: No apparent anesthesia complications

## 2019-01-23 LAB — CBC
HCT: 25.3 % — ABNORMAL LOW (ref 36.0–46.0)
Hemoglobin: 8.4 g/dL — ABNORMAL LOW (ref 12.0–15.0)
MCH: 31.6 pg (ref 26.0–34.0)
MCHC: 33.2 g/dL (ref 30.0–36.0)
MCV: 95.1 fL (ref 80.0–100.0)
Platelets: 228 10*3/uL (ref 150–400)
RBC: 2.66 MIL/uL — ABNORMAL LOW (ref 3.87–5.11)
RDW: 17.2 % — ABNORMAL HIGH (ref 11.5–15.5)
WBC: 8.2 10*3/uL (ref 4.0–10.5)
nRBC: 0.4 % — ABNORMAL HIGH (ref 0.0–0.2)

## 2019-01-23 LAB — COMPREHENSIVE METABOLIC PANEL
ALT: 30 U/L (ref 0–44)
AST: 37 U/L (ref 15–41)
Albumin: 2.3 g/dL — ABNORMAL LOW (ref 3.5–5.0)
Alkaline Phosphatase: 54 U/L (ref 38–126)
Anion gap: 9 (ref 5–15)
BUN: 15 mg/dL (ref 8–23)
CO2: 26 mmol/L (ref 22–32)
Calcium: 8 mg/dL — ABNORMAL LOW (ref 8.9–10.3)
Chloride: 96 mmol/L — ABNORMAL LOW (ref 98–111)
Creatinine, Ser: 1.16 mg/dL — ABNORMAL HIGH (ref 0.44–1.00)
GFR calc Af Amer: 54 mL/min — ABNORMAL LOW (ref >60)
GFR calc non Af Amer: 46 mL/min — ABNORMAL LOW (ref >60)
Glucose, Bld: 103 mg/dL — ABNORMAL HIGH (ref 70–99)
Potassium: 3.4 mmol/L — ABNORMAL LOW (ref 3.5–5.1)
Sodium: 131 mmol/L — ABNORMAL LOW (ref 135–145)
Total Bilirubin: 0.7 mg/dL (ref 0.3–1.2)
Total Protein: 4.9 g/dL — ABNORMAL LOW (ref 6.5–8.1)

## 2019-01-23 MED ORDER — POTASSIUM CHLORIDE CRYS ER 20 MEQ PO TBCR
40.0000 meq | EXTENDED_RELEASE_TABLET | Freq: Once | ORAL | Status: AC
  Administered 2019-01-23: 09:00:00 40 meq via ORAL
  Filled 2019-01-23: qty 2

## 2019-01-23 NOTE — Progress Notes (Signed)
PROGRESS NOTE    Susan [name]  ZRA:[id number] DOB: 1945-05-07 DOA: 01/18/2019 PCP: [name], MD   Brief Narrative:  74 year old woman with history of coronary artery disease status post CABG on dual antiplatelet therapy presenting with acute anemia likely secondary to GI bleed.  The patient initially presented to [facility] with fatigue and weakness over the past month.  She endorses some black stools and had a positive FOBT.  She is transferred to [facility] for further evaluation with gastroenterology here.  Cardiology was also consulted. Going for EGD today.  Subjective: Patient was feeling better when seen this morning.  No new complaints.  She would like to go back home.  Assessment & Plan:   Active Problems:   Hx of CABG   SEMI (subendocardial myocardial infarction) (HCC)   GI bleed   Acute upper GI bleed   Acute gastritis with hemorrhage   Acute gastric ulcer with hemorrhage  Anemia secondary to GI bleed.  Patient was on dual antiplatelet and using Goody powder quite frequently.  Most likely a combination of dual antiplatelet and chronic NSAID use. Hemoglobin stable at 8.4 after 2 units. GI was consulted and underwent EGD which shows ulcerative gastritis. Discussed with patient regarding no more use of NSAID. Currently holding aspirin and Plavix-GI need to clear her to start either of those. Continue Protonix.  Hypokalemia.  Still low although improved to 3.4 today. -Replete electrolytes and monitor.  History of CAD/NSTEMI.  Troponin peaked at 4940.  Please secondary to demand with acute GI bleed. Cardiology was consulted and they do not think that she needs any further intervention-recommending medical management. -Continue M. Doerr, metoprolol and Crestor. -Hold aspirin and Plavix-GI clearance for restart.  Heart failure with reduced ejection fraction. repeat echo 01/19/2019 with EF of 25 to 30% with findings consistent with multivessel CAD. -Does not appear  volume overload. -Continue with daily Lasix 40 mg.  Distal left radius and ulnar facture.  Secondary to mechanical fall.  No current issue.  She will follow-up with orthopedic. -Tylenol and oxycodone for pain. -Should get bisphosphonate as an outpatient-PCP can manage.  CKD II. baseline around 1.1. Return in stable around baseline. -Nephrotoxic and monitor.  Objective: Vitals:   01/22/19 1521 01/22/19 1800 01/22/19 2104 01/23/19 1352  BP: (!) 151/83  136/88 110/66  Pulse: 82   80  Resp: (!) 29 (!) 23  16  Temp:    97.8 F (36.6 C)  TempSrc:    Oral  SpO2: 99%   97%  Weight:      Height:        Intake/Output Summary (Last 24 hours) at 01/23/2019 1502 Last data filed at 01/23/2019 0831 Gross per 24 hour  Intake 240 ml  Output --  Net 240 ml   Filed Weights   01/18/19 1251 01/21/19 0601 01/22/19 1249  Weight: 39 kg 39.1 kg 36.3 kg    Examination:  General exam: Pleasant and very emaciated elderly lady,appears calm and comfortable  Respiratory system: Clear to auscultation. Respiratory effort normal. Cardiovascular system: S1 & S2 heard, RRR. No JVD, murmurs, rubs, gallops or clicks. Gastrointestinal system: Soft, nontender, nondistended, bowel sounds positive. Central nervous system: Alert and oriented. No focal neurological deficits.Symmetric 5 x 5 power. Extremities: No edema, no cyanosis, pulses intact and symmetrical.  Left forearm with Ace wrap. Skin: No rashes, lesions or ulcers Psychiatry: Judgement and insight appear normal. Mood & affect appropriate.   DVT prophylaxis: SCDs. Code Status: Full Family Communication: Son was  updated at bedside. Disposition Plan: Pending GI recommendations after EGD.  Consultants:   GI  Cardiology  Procedures:  EGD  Antimicrobials:   Data Reviewed: I have personally reviewed following labs and imaging studies  CBC: Recent Labs  Lab 01/20/19 0752 01/20/19 1816 01/21/19 0140 01/22/19 0349 01/22/19 1259  01/23/19 0436  WBC 10.7* 11.0* 9.3 7.4  --  8.2  HGB 9.1* 8.6* 8.0* 8.5* 8.5* 8.4*  HCT 27.0* 25.9* 24.4* 25.3* 25.0* 25.3*  MCV 90.9 92.2 92.8 93.0  --  95.1  PLT 143* 138* 134* 188  --  097   Basic Metabolic Panel: Recent Labs  Lab 01/19/19 0335 01/20/19 0752 01/21/19 0140 01/22/19 0329 01/22/19 0349 01/22/19 1259 01/23/19 0436  NA 134* 134* 131*  --  134* 131* 131*  K 3.9 3.6 3.3*  --  2.8* 4.1 3.4*  CL 103 97* 99  --  95* 95* 96*  CO2 22 27 25   --  31  --  26  GLUCOSE 138* 141* 119*  --  105* 107* 103*  BUN 53* 32* 20  --  15 13 15   CREATININE 1.11* 1.31* 1.17*  --  1.20* 1.10* 1.16*  CALCIUM 8.0* 8.2* 7.8*  --  7.9*  --  8.0*  MG  --   --   --  1.8  --   --   --    GFR: Estimated Creatinine Clearance: 24.4 mL/min (A) (by C-G formula based on SCr of 1.16 mg/dL (H)). Liver Function Tests: Recent Labs  Lab 01/18/19 1450 01/19/19 0335 01/20/19 0752 01/22/19 0349 01/23/19 0436  AST 20 57* 148* 58* 37  ALT 15 16 29 31 30   ALKPHOS 46 36* 39 49 54  BILITOT 0.6 0.6 0.8 1.0 0.7  PROT 6.0* 5.0* 5.1* 5.1* 4.9*  ALBUMIN 3.2* 2.6* 2.6* 2.3* 2.3*   No results for input(s): LIPASE, AMYLASE in the last 168 hours. No results for input(s): AMMONIA in the last 168 hours. Coagulation Profile: No results for input(s): INR, PROTIME in the last 168 hours. Cardiac Enzymes: No results for input(s): CKTOTAL, CKMB, CKMBINDEX, TROPONINI in the last 168 hours. BNP (last 3 results) No results for input(s): PROBNP in the last 8760 hours. HbA1C: No results for input(s): HGBA1C in the last 72 hours. CBG: Recent Labs  Lab 01/18/19 1258  GLUCAP 153*   Lipid Profile: No results for input(s): CHOL, HDL, LDLCALC, TRIG, CHOLHDL, LDLDIRECT in the last 72 hours. Thyroid Function Tests: No results for input(s): TSH, T4TOTAL, FREET4, T3FREE, THYROIDAB in the last 72 hours. Anemia Panel: No results for input(s): VITAMINB12, FOLATE, FERRITIN, TIBC, IRON, RETICCTPCT in the last 72  hours. Sepsis Labs: No results for input(s): PROCALCITON, LATICACIDVEN in the last 168 hours.  Recent Results (from the past 240 hour(s))  SARS CORONAVIRUS 2 (TAT 6-24 HRS) Nasopharyngeal Nasopharyngeal Swab     Status: None   Collection Time: 01/18/19  5:25 PM   Specimen: Nasopharyngeal Swab  Result Value Ref Range Status   SARS Coronavirus 2 NEGATIVE NEGATIVE Final    Comment: (NOTE) SARS-CoV-2 target nucleic acids are NOT DETECTED. The SARS-CoV-2 RNA is generally detectable in upper and lower respiratory specimens during the acute phase of infection. Negative results do not preclude SARS-CoV-2 infection, do not rule out co-infections with other pathogens, and should not be used as the sole basis for treatment or other patient management decisions. Negative results must be combined with clinical observations, patient history, and epidemiological information. The expected result is Negative. Fact Sheet  for Patients: HairSlick.no Fact Sheet for Healthcare Providers: quierodirigir.com This test is not yet approved or cleared by the [place] FDA and  has been authorized for detection and/or diagnosis of SARS-CoV-2 by FDA under an Emergency Use Authorization (EUA). This EUA will remain  in effect (meaning this test can be used) for the duration of the COVID-19 declaration under Section 56 4(b)(1) of the Act, 21 U.S.C. section 360bbb-3(b)(1), unless the authorization is terminated or revoked sooner. Performed at [facility], [address], [place], [place] [address]      Radiology Studies: No results found.  Scheduled Meds: . acetaminophen  650 mg Oral Q6H  . furosemide  40 mg Oral Daily  . hydrALAZINE  25 mg Oral TID  . isosorbide mononitrate  15 mg Oral Daily  . metoprolol succinate  12.5 mg Oral Daily  . pantoprazole  40 mg Oral BID AC  . rosuvastatin  5 mg Oral Daily   Continuous Infusions:    LOS: 5  days   Time spent: 35 minutes  Arnetha Courser, MD [facility] Pager [phone]  If 7PM-7AM, please contact night-coverage [email or web address] Password [facility] 01/23/2019, 3:02 PM   This record has been created using Dragon voice recognition software. Errors have been sought and corrected,but may not always be located. Such creation errors do not reflect on the standard of care.

## 2019-01-23 NOTE — Progress Notes (Signed)
Patient has not been compliant with safety measures and relentlessly attempts to get out of bed.  Patient became defensive when RN attempted to reorient patient and return her to the bed. [facility] was paged regarding the anxiousness of the patient, orders received (Ativan given). Patient is now resting. Will continue to monitor.   Bari Edward, RN

## 2019-01-24 ENCOUNTER — Other Ambulatory Visit: Payer: Self-pay | Admitting: Physician Assistant

## 2019-01-24 ENCOUNTER — Encounter: Payer: Self-pay | Admitting: *Deleted

## 2019-01-24 ENCOUNTER — Other Ambulatory Visit: Payer: Self-pay

## 2019-01-24 ENCOUNTER — Encounter: Payer: Self-pay | Admitting: Internal Medicine

## 2019-01-24 DIAGNOSIS — K250 Acute gastric ulcer with hemorrhage: Principal | ICD-10-CM

## 2019-01-24 DIAGNOSIS — K2901 Acute gastritis with bleeding: Secondary | ICD-10-CM

## 2019-01-24 LAB — CBC
HCT: 25.3 % — ABNORMAL LOW (ref 36.0–46.0)
Hemoglobin: 8.4 g/dL — ABNORMAL LOW (ref 12.0–15.0)
MCH: 31.1 pg (ref 26.0–34.0)
MCHC: 33.2 g/dL (ref 30.0–36.0)
MCV: 93.7 fL (ref 80.0–100.0)
Platelets: 276 10*3/uL (ref 150–400)
RBC: 2.7 MIL/uL — ABNORMAL LOW (ref 3.87–5.11)
RDW: 17 % — ABNORMAL HIGH (ref 11.5–15.5)
WBC: 6.7 10*3/uL (ref 4.0–10.5)
nRBC: 0.4 % — ABNORMAL HIGH (ref 0.0–0.2)

## 2019-01-24 LAB — SURGICAL PATHOLOGY

## 2019-01-24 LAB — BASIC METABOLIC PANEL
Anion gap: 8 (ref 5–15)
BUN: 16 mg/dL (ref 8–23)
CO2: 28 mmol/L (ref 22–32)
Calcium: 8.1 mg/dL — ABNORMAL LOW (ref 8.9–10.3)
Chloride: 97 mmol/L — ABNORMAL LOW (ref 98–111)
Creatinine, Ser: 1.19 mg/dL — ABNORMAL HIGH (ref 0.44–1.00)
GFR calc Af Amer: 52 mL/min — ABNORMAL LOW (ref >60)
GFR calc non Af Amer: 45 mL/min — ABNORMAL LOW (ref >60)
Glucose, Bld: 108 mg/dL — ABNORMAL HIGH (ref 70–99)
Potassium: 3.4 mmol/L — ABNORMAL LOW (ref 3.5–5.1)
Sodium: 133 mmol/L — ABNORMAL LOW (ref 135–145)

## 2019-01-24 LAB — MAGNESIUM: Magnesium: 1.9 mg/dL (ref 1.7–2.4)

## 2019-01-24 MED ORDER — PANTOPRAZOLE SODIUM 40 MG PO TBEC: 40.0000 mg | DELAYED_RELEASE_TABLET | Freq: Two times a day (BID) | ORAL | 1 refills | Status: DC

## 2019-01-24 MED ORDER — FERROUS SULFATE 325 (65 FE) MG PO TABS
325.0000 mg | ORAL_TABLET | Freq: Two times a day (BID) | ORAL | Status: DC
  Administered 2019-01-24: 325 mg via ORAL
  Filled 2019-01-24: qty 1

## 2019-01-24 MED ORDER — FERROUS SULFATE 325 (65 FE) MG PO TABS: 325.0000 mg | ORAL_TABLET | Freq: Two times a day (BID) | ORAL | 3 refills | Status: DC

## 2019-01-24 MED ORDER — POTASSIUM CHLORIDE CRYS ER 20 MEQ PO TBCR
40.0000 meq | EXTENDED_RELEASE_TABLET | Freq: Once | ORAL | Status: AC
  Administered 2019-01-24: 40 meq via ORAL
  Filled 2019-01-24: qty 2

## 2019-01-24 NOTE — Discharge Instructions (Signed)
Urinary Tract Infection, Adult A urinary tract infection (UTI) is an infection of any part of the urinary tract. The urinary tract includes:  The kidneys.  The ureters.  The bladder.  The urethra. These organs make, store, and get rid of pee (urine) in the body. What are the causes? This is caused by germs (bacteria) in your genital area. These germs grow and cause swelling (inflammation) of your urinary tract. What increases the risk? You are more likely to develop this condition if:  You have a small, thin tube (catheter) to drain pee.  You cannot control when you pee or poop (incontinence).  You are female, and: ? You use these methods to prevent pregnancy:  A medicine that kills sperm (spermicide).  A device that blocks sperm (diaphragm). ? You have low levels of a female hormone (estrogen). ? You are pregnant.  You have genes that add to your risk.  You are sexually active.  You take antibiotic medicines.  You have trouble peeing because of: ? A prostate that is bigger than normal, if you are female. ? A blockage in the part of your body that drains pee from the bladder (urethra). ? A kidney stone. ? A nerve condition that affects your bladder (neurogenic bladder). ? Not getting enough to drink. ? Not peeing often enough.  You have other conditions, such as: ? Diabetes. ? A weak disease-fighting system (immune system). ? Sickle cell disease. ? Gout. ? Injury of the spine. What are the signs or symptoms? Symptoms of this condition include:  Needing to pee right away (urgently).  Peeing often.  Peeing small amounts often.  Pain or burning when peeing.  Blood in the pee.  Pee that smells bad or not like normal.  Trouble peeing.  Pee that is cloudy.  Fluid coming from the vagina, if you are female.  Pain in the belly or lower back. Other symptoms include:  Throwing up (vomiting).  No urge to eat.  Feeling mixed up (confused).  Being tired  and grouchy (irritable).  A fever.  Watery poop (diarrhea). How is this treated? This condition may be treated with:  Antibiotic medicine.  Other medicines.  Drinking enough water. Follow these instructions at home:  Medicines  Take over-the-counter and prescription medicines only as told by your doctor.  If you were prescribed an antibiotic medicine, take it as told by your doctor. Do not stop taking it even if you start to feel better. General instructions  Make sure you: ? Pee until your bladder is empty. ? Do not hold pee for a long time. ? Empty your bladder after sex. ? Wipe from front to back after pooping if you are a female. Use each tissue one time when you wipe.  Drink enough fluid to keep your pee pale yellow.  Keep all follow-up visits as told by your doctor. This is important. Contact a doctor if:  You do not get better after 1-2 days.  Your symptoms go away and then come back. Get help right away if:  You have very bad back pain.  You have very bad pain in your lower belly.  You have a fever.  You are sick to your stomach (nauseous).  You are throwing up. Summary  A urinary tract infection (UTI) is an infection of any part of the urinary tract.  This condition is caused by germs in your genital area.  There are many risk factors for a UTI. These include having a small, thin   tube to drain pee and not being able to control when you pee or poop.  Treatment includes antibiotic medicines for germs.  Drink enough fluid to keep your pee pale yellow. This information is not intended to replace advice given to you by your health care provider. Make sure you discuss any questions you have with your health care provider. Document Revised: 12/15/2017 Document Reviewed: 07/07/2017 Elsevier Patient Education  2020 [employer]  

## 2019-01-24 NOTE — Progress Notes (Signed)
Contacted by hospital medicine regarding discharge.  Okay to discharge home from a GI perspective.  Would hold Plavix x1 week.  Okay to resume 81 mg aspirin daily at this time.  Would recommend long-term PPI in the setting of aspirin and Plavix given gastritis with ulcer.  She should follow-up with Dr. Darrick Penna office within the month after hospitalization  Please call if further questions

## 2019-01-24 NOTE — Discharge Summary (Signed)
Physician Discharge Summary  Susan [name] HCW:[id number] DOB: [birth year] DOA: 01/18/2019  PCP: [name], MD  Admit date: 01/18/2019 Discharge date: 01/24/2019  Admitted From:  Disposition:    Recommendations for Outpatient Follow-up:  1. Follow up with PCP in 1-2 weeks 2. Follow-up with cardiology 3. Follow-up with GI. 4. Please obtain BMP/CBC in one week 5. Please follow up on the following pending results:  Home Health: No Equipment/Devices: None Discharge Condition: Stable CODE STATUS: Full Diet recommendation: Heart Healthy  Brief/Interim Summary: 74 year old woman with history of coronary artery disease status post CABG on dual antiplatelet therapy presenting with acute anemia likely secondary to GI bleed. The patient initially presented to [facility] with fatigue and weakness over the past month. She endorses some black stools and had a positive FOBT. She is transferred to [facility] for further evaluation with gastroenterology here. Cardiology was also consulted as patient has recent NSTEMI in November 2020 and was placed on dual antiplatelets. Patient was using Goody powder most of the days.  Patient underwent EGD with gastroenterology on 01/22/2019 which shows ulcerative gastritis.  No active bleeding.  GI is recommending twice daily Protonix. Avoid all NSAID, she can restart her aspirin 81 mg daily from tomorrow.  She will hold Plavix for another week. She needs to follow-up with her cardiologist and gastroenterologist for further management. Was also started on iron supplement for anemia.  Heart failure with reduced ejection fraction. repeat echo 01/19/2019 with EF of 25 to 30% with findings consistent with multivessel CAD. -Does not appear volume overload. -Continue with daily Lasix 40 mg.  Distal left radius and ulnar facture.  Secondary to mechanical fall.  No current issue.  She will follow-up with orthopedic. -Tylenol and oxycodone for pain. -Should get  bisphosphonate as an outpatient-PCP can manage.  CKD II. baseline around 1.1. Creatinine remain stable around baseline.  Discharge Diagnoses:  Active Problems:   Hx of CABG   SEMI (subendocardial myocardial infarction) (HCC)   GI bleed   Acute upper GI bleed   Acute gastritis with hemorrhage   Acute gastric ulcer with hemorrhage   Discharge Instructions  Discharge Instructions    Diet - low sodium heart healthy   Complete by: As directed    Discharge instructions   Complete by: As directed    It was pleasure taking care of you. According to your gastroenterologist recommendations you can start taking aspirin 81 mg daily from tomorrow. Hold Plavix for another week and then restarted. Please follow-up with cardiology and gastroenterology were then 2 to 4 weeks for further recommendations and management. You will continue taking Protonix twice daily. Avoid any Goody powder, ibuprofen or Aleve. Can use Tylenol for pain if needed.   Increase activity slowly   Complete by: As directed      Allergies as of 01/24/2019   No Known Allergies     Medication List    TAKE these medications   aspirin EC 81 MG tablet Take 81 mg by mouth daily.   clopidogrel 75 MG tablet Commonly known as: PLAVIX Take 1 tablet (75 mg total) by mouth daily with breakfast.   ferrous sulfate 325 (65 FE) MG tablet Take 1 tablet (325 mg total) by mouth 2 (two) times daily with a meal.   furosemide 40 MG tablet Commonly known as: LASIX Take 1 tablet (40 mg total) by mouth daily. (may take extra as needed for weight gain of 3 pounds in 1 day or 5 pounds in 1 week)  hydrALAZINE 25 MG tablet Commonly known as: APRESOLINE Take 1 tablet (25 mg total) by mouth 3 (three) times daily.   isosorbide mononitrate 30 MG 24 hr tablet Commonly known as: IMDUR Take 0.5 tablets (15 mg total) by mouth daily.   metoprolol succinate 25 MG 24 hr tablet Commonly known as: TOPROL-XL Take 0.5 tablets (12.5 mg  total) by mouth daily.   multivitamin tablet Take 1 tablet by mouth daily.   nitroGLYCERIN 0.4 MG SL tablet Commonly known as: NITROSTAT Place 1 tablet (0.4 mg total) under the tongue every 5 (five) minutes x 3 doses as needed for chest pain.   pantoprazole 40 MG tablet Commonly known as: PROTONIX Take 1 tablet (40 mg total) by mouth 2 (two) times daily before a meal.   potassium chloride SA 20 MEQ tablet Commonly known as: Klor-Con M20 Take 1 tablet (20 mEq total) by mouth daily. (may take one extra tab when taking extra Lasix)   rosuvastatin 5 MG tablet Commonly known as: CRESTOR Take 5 mg by mouth daily.   vitamin B-12 250 MCG tablet Commonly known as: CYANOCOBALAMIN Take 250 mcg by mouth daily.      Follow-up Information    Fields, Darleene Cleaver, MD. Schedule an appointment as soon as possible for a visit.   Specialty: Gastroenterology Why: call to make appointment to follow up w Dr Darrick Penna or her nurse practitioner in next 3 to 4 weeks.  for follow up of bleeding and gastritis.   Contact information: [address] [place] [address] [phone]          No Known Allergies  Consultations:  GI  Cardiology  Procedures/Studies: DG Chest 2 View  Result Date: 01/18/2019 CLINICAL DATA:  74 year old female with history of cough. EXAM: CHEST - 2 VIEW COMPARISON:  Chest x-ray 01/17/2019. FINDINGS: Lung volumes are increased with emphysematous changes. No acute consolidative airspace disease. No pleural effusions. No pneumothorax. No suspicious appearing pulmonary nodules or masses. No evidence of pulmonary edema. Heart size is mildly enlarged. Upper mediastinal contours are within normal limits. Aortic atherosclerosis. Status post median sternotomy for CABG including LIMA. Surgical clips projecting over the right hemithorax, presumably in the overlying breast. IMPRESSION: 1. No radiographic evidence of acute cardiopulmonary disease. 2. Emphysema. 3. Mild cardiomegaly. 4.  Aortic atherosclerosis. Electronically Signed   By: Trudie Reed M.D.   On: 01/18/2019 16:10   DG Wrist 2 Views Left  Result Date: 01/18/2019 CLINICAL DATA:  Status post reduction EXAM: LEFT WRIST - 2 VIEW COMPARISON:  01/18/2019 FINDINGS: Casting material is now seen. Previously seen distal radial and ulnar fractures are again identified. The posterior displacement has been significantly reduced although mild posterior angulation remains. No other focal abnormality is seen IMPRESSION: Reduction and casting of distal radial and ulnar fractures. Electronically Signed   By: Alcide Clever M.D.   On: 01/18/2019 21:39   DG Wrist Complete Left  Result Date: 01/18/2019 CLINICAL DATA:  Fall EXAM: LEFT HAND - COMPLETE 3+ VIEW; LEFT WRIST - COMPLETE 3+ VIEW COMPARISON:  None. FINDINGS: There are fractures of the distal left radius and ulna with dorsal angulation and mild displacement. There are marked changes of osteoarthritis at the first carpometacarpal joint. Less pronounced changes of osteoarthritis are present at the triscaphe and distal interphalangeal joints. IMPRESSION: Acute fractures of the distal left radius and ulna with dorsal angulation and mild displacement. Electronically Signed   By: Guadlupe Spanish M.D.   On: 01/18/2019 13:35   CT Head Wo Contrast  Result Date: 01/18/2019 CLINICAL DATA:  Recent fall with headaches and neck pain, initial encounter EXAM: CT HEAD WITHOUT CONTRAST CT CERVICAL SPINE WITHOUT CONTRAST TECHNIQUE: Multidetector CT imaging of the head and cervical spine was performed following the standard protocol without intravenous contrast. Multiplanar CT image reconstructions of the cervical spine were also generated. COMPARISON:  None. FINDINGS: CT HEAD FINDINGS Brain: No evidence of acute infarction, hemorrhage, hydrocephalus, extra-axial collection or mass lesion/mass effect. Mild chronic white matter ischemic changes are seen. 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 Vascular:  No hyperdense vessel or unexpected calcification. Skull: Normal. Negative for fracture or focal lesion. Sinuses/Orbits: No acute finding. Other: None. CT CERVICAL SPINE FINDINGS Alignment: Within normal limits. Skull base and vertebrae: 7 cervical segments are well visualized. Vertebral body height is well maintained. Multilevel facet hypertrophic changes are seen. No acute fracture or acute facet abnormality is noted. Soft tissues and spinal canal: Surrounding soft tissue structures are within normal limits. Upper chest: Visualized lung apices are within normal limits. Other: None IMPRESSION: CT of the head: Chronic ischemic changes without acute abnormality. CT of the cervical spine: Multilevel degenerative change without acute abnormality. Electronically Signed   By: Alcide Clever M.D.   On: 01/18/2019 17:06   CT Cervical Spine Wo Contrast  Result Date: 01/18/2019 CLINICAL DATA:  Recent fall with headaches and neck pain, initial encounter EXAM: CT HEAD WITHOUT CONTRAST CT CERVICAL SPINE WITHOUT CONTRAST TECHNIQUE: Multidetector CT imaging of the head and cervical spine was performed following the standard protocol without intravenous contrast. Multiplanar CT image reconstructions of the cervical spine were also generated. COMPARISON:  None. FINDINGS: CT HEAD FINDINGS Brain: No evidence of acute infarction, hemorrhage, hydrocephalus, extra-axial collection or mass lesion/mass effect. Mild chronic white matter ischemic changes are seen. 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 Vascular: No hyperdense vessel or unexpected calcification. Skull: Normal. Negative for fracture or focal lesion. Sinuses/Orbits: No acute finding. Other: None. CT CERVICAL SPINE FINDINGS Alignment: Within normal limits. Skull base and vertebrae: 7 cervical segments are well visualized. Vertebral body height is well maintained. Multilevel facet hypertrophic changes are seen. No acute fracture or acute facet abnormality is noted. Soft  tissues and spinal canal: Surrounding soft tissue structures are within normal limits. Upper chest: Visualized lung apices are within normal limits. Other: None IMPRESSION: CT of the head: Chronic ischemic changes without acute abnormality. CT of the cervical spine: Multilevel degenerative change without acute abnormality. Electronically Signed   By: Alcide Clever M.D.   On: 01/18/2019 17:06   DG Hand Complete Left  Result Date: 01/18/2019 CLINICAL DATA:  Fall EXAM: LEFT HAND - COMPLETE 3+ VIEW; LEFT WRIST - COMPLETE 3+ VIEW COMPARISON:  None. FINDINGS: There are fractures of the distal left radius and ulna with dorsal angulation and mild displacement. There are marked changes of osteoarthritis at the first carpometacarpal joint. Less pronounced changes of osteoarthritis are present at the triscaphe and distal interphalangeal joints. IMPRESSION: Acute fractures of the distal left radius and ulna with dorsal angulation and mild displacement. Electronically Signed   By: Guadlupe Spanish M.D.   On: 01/18/2019 13:35   ECHOCARDIOGRAM LIMITED  Result Date: 01/19/2019   ECHOCARDIOGRAM LIMITED REPORT   Patient Name:   Susan [name] Date of Exam: 01/19/2019 Medical Rec #:  [id number]      Height:       60.0 in Accession #:    [id number]     Weight:       86.0 lb Date  of Birth:  1945/03/08       BSA:          1.30 m Patient Age:    73 years       BP:           159/82 mmHg Patient Gender: F              HR:           105 bpm. [facility]  Procedure: 2D Echo, Cardiac Doppler and Color Doppler Indications:    NSTEMI / Limited Study eval LVEF and WMA  History:        Patient has prior history of Echocardiogram examinations, most                 recent 11/19/2018. Smoker.  Sonographer:    Celesta Gentile RCS Referring Phys: [id number] Ellsworth Lennox IMPRESSIONS  1. Left ventricular ejection fraction, by visual estimation, is 25 to 30%. The left ventricle has severely decreased function. Left ventricular septal wall  thickness was normal.  2. Left ventricular diastolic function could not be evaluated.  3. Severely dilated left ventricular internal cavity size.  4. 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  5. Global right ventricle has normal systolc function.The right ventricular size is normal. no increase in right ventricular wall thickness.  6. Left atrial size was mildly dilated.  7. The tricuspid valve was not assessed.  8. Mild aortic valve sclerosis without stenosis.  9. Aortic root could not be assessed. 10. ? prominent chiarri network at dome of RA in 4 chamber view. FINDINGS  Left Ventricle: Left ventricular ejection fraction, by visual estimation, is 25 to 30%. The left ventricle has severely decreased function. Severe hypokinesis of the left ventricular, apical septal wall, anteroseptal wall and inferoseptal wall. Severe hypokinesis of the left ventricular, entire inferior wall. The left ventricular internal cavity size was severely dilated left ventricle. Left ventricular septal wall thickness was normal. Left ventricular diastolic function could not be evaluated. 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 Right Ventricle: The right ventricular size is normal. No increase in right ventricular wall thickness. Global RV systolic function is has normal systolic function. Left Atrium: Left atrial size was mildly dilated. Right Atrium: Right atrial size was normal in size. Right atrial pressure is estimated at 10 mmHg. ? prominent chiarri network at dome of RA in 4 chamber view. Pericardium: There is no evidence of pericardial effusion is seen. There is no evidence of pericardial effusion. Tricuspid Valve: The tricuspid valve is not assessed. Aortic Valve: The aortic valve is tricuspid. Mild aortic  valve sclerosis is present, with no evidence of aortic valve stenosis. Pulmonic Valve: The pulmonic valve was not assessed. Aorta: Aortic root could not be assessed. Shunts: No atrial level shunt detected by color flow Doppler.  LEFT VENTRICLE          Normals PLAX 2D LVIDd:         4.85 cm  3.6 cm LVIDs:         4.40 cm  1.7 cm LV PW:         0.81 cm  1.4 cm LV IVS:        1.01 cm  1.3 cm LVOT diam:     1.70 cm  2.0 cm LV  SV:         22 ml    79 ml LV SV Index:   17.59    45 ml/m2 LVOT Area:     2.27 cm 3.14 cm2  LV Volumes (MOD)             Normals LV area d, A2C:    26.20 cm LV area d, A4C:    31.00 cm LV area s, A2C:    22.10 cm LV area s, A4C:    26.30 cm LV major d, A2C:   7.15 cm LV major d, A4C:   7.32 cm LV major s, A2C:   6.60 cm LV major s, A4C:   6.85 cm LV vol d, MOD A2C: 81.4 ml   68 ml LV vol d, MOD A4C: 108.0 ml LV vol s, MOD A2C: 60.5 ml   24 ml LV vol s, MOD A4C: 84.3 ml LV SV MOD A2C:     20.9 ml LV SV MOD A4C:     108.0 ml LV SV MOD BP:      21.9 ml   45 ml LEFT ATRIUM         Index LA diam:    3.30 cm 2.53 cm/m   AORTA                 Normals Ao Root diam: 2.80 cm 31 mm  SHUNTS Systemic Diam: 1.70 cm  Charlton Haws MD Electronically signed by Charlton Haws MD Signature Date/Time: 01/19/2019/2:10:34 PM    Final     Subjective: Patient was feeling better when the day of discharge.  No new complaints.  No nausea or vomiting.  Discharge Exam: Vitals:   01/24/19 0617 01/24/19 0752  BP: 135/75 119/68  Pulse: 86   Resp: (!) 24 20  Temp: 98.8 F (37.1 C)   SpO2: 97% 98%   Vitals:   01/23/19 2042 01/23/19 2222 01/24/19 0617 01/24/19 0752  BP: 133/90 (!) 145/91 135/75 119/68  Pulse: 92  86   Resp: 16  (!) 24 20  Temp: 99.3 F (37.4 C)  98.8 F (37.1 C)   TempSrc: Oral  Oral   SpO2: 97%  97% 98%  Weight:   37.8 kg   Height:        General: Pt is alert, awake, not in acute distress Cardiovascular: RRR, S1/S2 +, no rubs, no gallops Respiratory: CTA bilaterally, no wheezing,  no rhonchi Abdominal: Soft, NT, ND, bowel sounds + Extremities: no edema, no cyanosis   The results of significant diagnostics from this hospitalization (including imaging, microbiology, ancillary and laboratory) are listed below for reference.    Microbiology: Recent Results (from the past 240 hour(s))  SARS CORONAVIRUS 2 (TAT 6-24 HRS) Nasopharyngeal Nasopharyngeal Swab     Status: None   Collection Time: 01/18/19  5:25 PM   Specimen: Nasopharyngeal Swab  Result Value Ref Range Status   SARS Coronavirus 2 NEGATIVE NEGATIVE Final    Comment: (NOTE) SARS-CoV-2 target nucleic acids are NOT DETECTED. The SARS-CoV-2 RNA is generally detectable in upper and lower respiratory specimens during the acute phase of infection. Negative results do not preclude SARS-CoV-2 infection, do not rule out co-infections with other pathogens, and should not be used as the sole basis for treatment or other patient management decisions. Negative results must be combined with clinical observations, patient history, and epidemiological information. The expected result is Negative. Fact Sheet for Patients: HairSlick.no Fact Sheet for Healthcare Providers: quierodirigir.com This test is not  yet approved or cleared by the [place] and  has been authorized for detection and/or diagnosis of SARS-CoV-2 by FDA under an Emergency Use Authorization (EUA). This EUA will remain  in effect (meaning this test can be used) for the duration of the COVID-19 declaration under Section 56 4(b)(1) of the Act, 21 U.S.C. section 360bbb-3(b)(1), unless the authorization is terminated or revoked sooner. Performed at [facility], [address], [place], [place] [address]      Labs: BNP (last 3 results) Recent Labs    11/19/18 1151 01/18/19 1450  BNP 1,855.0* 496.0*   Basic Metabolic Panel: Recent Labs  Lab 01/20/19 0752 01/21/19 0140  01/22/19 0329 01/22/19 0349 01/22/19 1259 01/23/19 0436 01/24/19 0319  NA 134* 131*  --  134* 131* 131* 133*  K 3.6 3.3*  --  2.8* 4.1 3.4* 3.4*  CL 97* 99  --  95* 95* 96* 97*  CO2 27 25  --  31  --  26 28  GLUCOSE 141* 119*  --  105* 107* 103* 108*  BUN 32* 20  --  15 13 15 16   CREATININE 1.31* 1.17*  --  1.20* 1.10* 1.16* 1.19*  CALCIUM 8.2* 7.8*  --  7.9*  --  8.0* 8.1*  MG  --   --  1.8  --   --   --   --    Liver Function Tests: Recent Labs  Lab 01/18/19 1450 01/19/19 0335 01/20/19 0752 01/22/19 0349 01/23/19 0436  AST 20 57* 148* 58* 37  ALT 15 16 29 31 30   ALKPHOS 46 36* 39 49 54  BILITOT 0.6 0.6 0.8 1.0 0.7  PROT 6.0* 5.0* 5.1* 5.1* 4.9*  ALBUMIN 3.2* 2.6* 2.6* 2.3* 2.3*   No results for input(s): LIPASE, AMYLASE in the last 168 hours. No results for input(s): AMMONIA in the last 168 hours. CBC: Recent Labs  Lab 01/20/19 1816 01/21/19 0140 01/22/19 0349 01/22/19 1259 01/23/19 0436 01/24/19 0319  WBC 11.0* 9.3 7.4  --  8.2 6.7  HGB 8.6* 8.0* 8.5* 8.5* 8.4* 8.4*  HCT 25.9* 24.4* 25.3* 25.0* 25.3* 25.3*  MCV 92.2 92.8 93.0  --  95.1 93.7  PLT 138* 134* 188  --  228 276   Cardiac Enzymes: No results for input(s): CKTOTAL, CKMB, CKMBINDEX, TROPONINI in the last 168 hours. BNP: Invalid input(s): POCBNP CBG: Recent Labs  Lab 01/18/19 1258  GLUCAP 153*   D-Dimer No results for input(s): DDIMER in the last 72 hours. Hgb A1c No results for input(s): HGBA1C in the last 72 hours. Lipid Profile No results for input(s): CHOL, HDL, LDLCALC, TRIG, CHOLHDL, LDLDIRECT in the last 72 hours. Thyroid function studies No results for input(s): TSH, T4TOTAL, T3FREE, THYROIDAB in the last 72 hours.  Invalid input(s): FREET3 Anemia work up No results for input(s): VITAMINB12, FOLATE, FERRITIN, TIBC, IRON, RETICCTPCT in the last 72 hours. Urinalysis    Component Value Date/Time   COLORURINE YELLOW 01/18/2019 2329   APPEARANCEUR CLEAR 01/18/2019 2329   LABSPEC  1.019 01/18/2019 2329   PHURINE 5.0 01/18/2019 2329   GLUCOSEU NEGATIVE 01/18/2019 2329   HGBUR SMALL (A) 01/18/2019 2329   BILIRUBINUR NEGATIVE 01/18/2019 2329   KETONESUR NEGATIVE 01/18/2019 2329   PROTEINUR 100 (A) 01/18/2019 2329   NITRITE NEGATIVE 01/18/2019 2329   LEUKOCYTESUR NEGATIVE 01/18/2019 2329   Sepsis Labs Invalid input(s): PROCALCITONIN,  WBC,  LACTICIDVEN Microbiology Recent Results (from the past 240 hour(s))  SARS CORONAVIRUS 2 (TAT 6-24 HRS)  Nasopharyngeal Nasopharyngeal Swab     Status: None   Collection Time: 01/18/19  5:25 PM   Specimen: Nasopharyngeal Swab  Result Value Ref Range Status   SARS Coronavirus 2 NEGATIVE NEGATIVE Final    Comment: (NOTE) SARS-CoV-2 target nucleic acids are NOT DETECTED. The SARS-CoV-2 RNA is generally detectable in upper and lower respiratory specimens during the acute phase of infection. Negative results do not preclude SARS-CoV-2 infection, do not rule out co-infections with other pathogens, and should not be used as the sole basis for treatment or other patient management decisions. Negative results must be combined with clinical observations, patient history, and epidemiological information. The expected result is Negative. Fact Sheet for Patients: HairSlick.no Fact Sheet for Healthcare Providers: quierodirigir.com This test is not yet approved or cleared by the [place] FDA and  has been authorized for detection and/or diagnosis of SARS-CoV-2 by FDA under an Emergency Use Authorization (EUA). This EUA will remain  in effect (meaning this test can be used) for the duration of the COVID-19 declaration under Section 56 4(b)(1) of the Act, 21 U.S.C. section 360bbb-3(b)(1), unless the authorization is terminated or revoked sooner. Performed at [facility], [address], [place], [place] [address]     Time coordinating discharge: Over 30  minutes  SIGNED:  Arnetha Courser, MD  [facility] 01/24/2019, 12:28 PM Pager [phone]  If 7PM-7AM, please contact night-coverage [email or web address] Password [email or web address]  This record has been created using Conservation officer, historic buildings. Errors have been sought and corrected,but may not always be located. Such creation errors do not reflect on the standard of care.

## 2019-01-31 ENCOUNTER — Other Ambulatory Visit (HOSPITAL_COMMUNITY): Payer: Self-pay | Admitting: Specialist

## 2019-01-31 DIAGNOSIS — Z681 Body mass index (BMI) 19 or less, adult: Secondary | ICD-10-CM | POA: Diagnosis not present

## 2019-01-31 DIAGNOSIS — S62102A Fracture of unspecified carpal bone, left wrist, initial encounter for closed fracture: Secondary | ICD-10-CM | POA: Diagnosis not present

## 2019-01-31 DIAGNOSIS — J449 Chronic obstructive pulmonary disease, unspecified: Secondary | ICD-10-CM | POA: Diagnosis not present

## 2019-01-31 DIAGNOSIS — I10 Essential (primary) hypertension: Secondary | ICD-10-CM | POA: Diagnosis not present

## 2019-01-31 DIAGNOSIS — D649 Anemia, unspecified: Secondary | ICD-10-CM | POA: Diagnosis not present

## 2019-01-31 DIAGNOSIS — Z299 Encounter for prophylactic measures, unspecified: Secondary | ICD-10-CM | POA: Diagnosis not present

## 2019-02-01 ENCOUNTER — Ambulatory Visit: Payer: Self-pay

## 2019-02-01 ENCOUNTER — Ambulatory Visit (INDEPENDENT_AMBULATORY_CARE_PROVIDER_SITE_OTHER): Payer: Medicare PPO

## 2019-02-01 ENCOUNTER — Encounter: Payer: Self-pay | Admitting: Orthopaedic Surgery

## 2019-02-01 ENCOUNTER — Ambulatory Visit (INDEPENDENT_AMBULATORY_CARE_PROVIDER_SITE_OTHER): Payer: Medicare PPO | Admitting: Orthopaedic Surgery

## 2019-02-01 VITALS — Ht 60.0 in | Wt 83.0 lb

## 2019-02-01 DIAGNOSIS — S52532A Colles' fracture of left radius, initial encounter for closed fracture: Secondary | ICD-10-CM

## 2019-02-01 DIAGNOSIS — M25532 Pain in left wrist: Secondary | ICD-10-CM

## 2019-02-01 DIAGNOSIS — M25531 Pain in right wrist: Secondary | ICD-10-CM | POA: Diagnosis not present

## 2019-02-01 NOTE — Progress Notes (Signed)
Office Visit Note   Patient: Susan [name]           Date of Birth: 01/17/1945           MRN: [id number] Visit Date: 02/01/2019              Requested by: [name], MD [address] [place],  [place] [address] PCP: [name], MD   Assessment & Plan: Visit Diagnoses:  1. Pain in left wrist   2. Closed Colles' fracture of left radius, initial encounter     Plan: Betadine prep hematoma block Xeroform to the skin abrasion mid forearm dorsally, fingertrap traction 10 pounds counterweight, hematoma block attempted but it 2 weeks post injury only trace blood noted.  Wrist reduction and postreduction x-rays obtained second set.  Some improvement in angulation slight increased length but she still remains angulated.  We'll recheck her in 1 week repeat x-rays in the splint.  Follow-Up Instructions: Return in about 1 week (around 02/08/2019).   Orders:  Orders Placed This Encounter  Procedures  . XR Wrist 2 Views Left  . XR Wrist 2 Views Left   No orders of the defined types were placed in this encounter.     Procedures: No procedures performed   Clinical Data: No additional findings.   Subjective: Chief Complaint  Patient presents with  . Left Wrist - Fracture    Fall 01/18/2019    HPI 74 year old female fell in her kitchen with distal radius fracture, left.  Her fall and injury was on 01/18/2019 she was seen in the emergency room at [facility].  Reduced in the ER by ER MD placed in a volar splint he got fairly good reduction with correction angulation only slight displacement.  Elbow was not immobilized and patient is lost reduction.  New x-rays demonstrate 100% displacement and 45 degrees angulation.  Review of Systems patient review of systems updated history of GI problems GI bleed history of CABG procedure ischemic cardiomyopathy pulmonary hypertension renal insufficiency otherwise noncontributory to HPI.   Objective: Vital Signs: Ht 5' (1.524 m)   Wt 83 lb (37.6 kg)   BMI  16.21 kg/m   Physical Exam Constitutional:      Appearance: She is well-developed.  HENT:     Head: Normocephalic.     Right Ear: External ear normal.     Left Ear: External ear normal.  Eyes:     Pupils: Pupils are equal, round, and reactive to light.  Neck:     Thyroid: No thyromegaly.     Trachea: No tracheal deviation.  Cardiovascular:     Rate and Rhythm: Normal rate.  Pulmonary:     Effort: Pulmonary effort is normal.  Abdominal:     Palpations: Abdomen is soft.  Skin:    General: Skin is warm and dry.  Neurological:     Mental Status: She is alert and oriented to person, place, and time.  Psychiatric:        Behavior: Behavior normal.     Ortho Exam median sensation is intact.  Patient had abrasion over the mid dorsum with thin skin this was cleaned Xeroform applied and 2 x 2.  Specialty Comments:  No specialty comments available.  Imaging: XR Wrist 2 Views Left  Result Date: 02/01/2019 Postreduction x-rays demonstrate correction from 40 degrees dorsal angulation 30 degrees angulation.  Wrist is in flexed position slight ulnar deviation. Impression: Comparison to postreduction x-rays 01/18/2019 alignment is not is good.  It is improved from  prereduction x-rays with 15 degrees improvement in angulation.  XR Wrist 2 Views Left  Result Date: 02/01/2019 Three-view x-rays left distal radius obtained and reviewed.  This shows comminuted distal radius fracture with loss of position compared to postreduction images obtained after ER MD reduced the wrist placed in a short arm splint. Impression: Recurrent deformity comminuted distal radius fracture with 45 degrees angulation and 100% displacement.    PMFS History: Patient Active Problem List   Diagnosis Date Noted  . Acute gastritis with hemorrhage   . Acute gastric ulcer with hemorrhage   . Acute upper GI bleed 01/19/2019  . Symptomatic anemia   . ST segment depression   . Elevated troponin   . Fracture, Colles,  left, closed   . Fracture   . GI bleed 01/18/2019  . Acute on chronic systolic and diastolic heart failure, NYHA class 1 (HCC) 11/21/2018  . CKD (chronic kidney disease) stage 3, GFR 30-59 ml/min 11/21/2018  . CAD in native artery 11/21/2018  . HTN (hypertension) 11/21/2018  . HLD (hyperlipidemia) 11/21/2018  . SEMI (subendocardial myocardial infarction) (HCC) 11/19/2018  . Hx of CABG 02/18/2017  . Diastolic dysfunction 02/18/2017  . Pulmonary hypertension (HCC) 02/18/2017  . Smoker 02/18/2017  . Renal insufficiency 02/18/2017  . Dyslipidemia 02/18/2017  . Ischemic cardiomyopathy    Past Medical History:  Diagnosis Date  . Acute on chronic systolic and diastolic heart failure, NYHA class 1 (HCC) 11/21/2018  . Anxiety   . CAD (coronary artery disease)    a. s/p CABG x5 in 2000 b. cath in 02/2017 showing 2/5 patent grafts with patent LIMA-LAD, patent SVG-distal RCA, occluded SVG-D1 and occluded seq-SVG-OM1-OM2 with medical management recommended and possible PCI of distal RCA branches in the future if recurrent anginal symptoms  . CHF (congestive heart failure) (HCC)    a. EF 20-25% by echo in 12/2016 b. EF 45-50% by echo in 05/2017 c. EF 30% by echo in 11/2018  . Chronic bronchitis (HCC)   . CKD (chronic kidney disease) stage 3, GFR 30-59 ml/min 11/21/2018  . COPD (chronic obstructive pulmonary disease) (HCC)   . Dizziness   . Esophageal reflux   . Essential hypertension   . Fatigue   . HLD (hyperlipidemia) 11/21/2018  . HTN (hypertension) 11/21/2018  . Hypercholesteremia   . Hyperkalemia   . Ovarian failure   . SOB (shortness of breath)     Family History  Problem Relation Age of Onset  . Heart attack Mother   . Hypertension Father     Past Surgical History:  Procedure Laterality Date  . ABDOMINAL HYSTERECTOMY    . APPENDECTOMY    . BIOPSY  01/22/2019   Procedure: BIOPSY;  Surgeon: Beverley Fiedler, MD;  Location: [facility] ENDOSCOPY;  Service: Gastroenterology;;  . BREAST  BIOPSY Right   . CARDIAC CATHETERIZATION  2000  . CORONARY ARTERY BYPASS GRAFT  2000   CABG X5  . ESOPHAGOGASTRODUODENOSCOPY (EGD) WITH PROPOFOL N/A 01/22/2019   Procedure: ESOPHAGOGASTRODUODENOSCOPY (EGD) WITH PROPOFOL;  Surgeon: Beverley Fiedler, MD;  Location: [facility] ENDOSCOPY;  Service: Gastroenterology;  Laterality: N/A;  . LEFT HEART CATH AND CORS/GRAFTS ANGIOGRAPHY N/A 02/17/2017   Procedure: LEFT HEART CATH AND CORS/GRAFTS ANGIOGRAPHY;  Surgeon: Kathleene Hazel, MD;  Location: [facility] INVASIVE CV LAB;  Service: Cardiovascular;  Laterality: N/A;  . LEFT HEART CATH AND CORS/GRAFTS ANGIOGRAPHY N/A 11/20/2018   Procedure: LEFT HEART CATH AND CORS/GRAFTS ANGIOGRAPHY;  Surgeon: Yvonne Kendall, MD;  Location: [facility] INVASIVE CV LAB;  Service: Cardiovascular;  Laterality: N/A;   Social History   Occupational History  . Not on file  Tobacco Use  . Smoking status: Current Every Day Smoker    Packs/day: 1.00    Years: 50.00    Pack years: 50.00    Types: Cigarettes    Last attempt to quit: 02/22/2017    Years since quitting: 1.9  . Smokeless tobacco: Never Used  Substance and Sexual Activity  . Alcohol use: No  . Drug use: No  . Sexual activity: Never

## 2019-02-08 ENCOUNTER — Ambulatory Visit (INDEPENDENT_AMBULATORY_CARE_PROVIDER_SITE_OTHER): Payer: Medicare PPO | Admitting: Orthopaedic Surgery

## 2019-02-08 ENCOUNTER — Ambulatory Visit (INDEPENDENT_AMBULATORY_CARE_PROVIDER_SITE_OTHER): Payer: Medicare PPO

## 2019-02-08 VITALS — Ht 60.0 in | Wt 83.0 lb

## 2019-02-08 DIAGNOSIS — S52532A Colles' fracture of left radius, initial encounter for closed fracture: Secondary | ICD-10-CM | POA: Diagnosis not present

## 2019-02-08 NOTE — Progress Notes (Signed)
Office Visit Note   Patient: Susan [name]           Date of Birth: 1945-01-18           MRN: [id number] Visit Date: 02/08/2019              Requested by: [name], MD [address] [place],  [place] [address] PCP: [name], MD   Assessment & Plan: Visit Diagnoses:  1. Closed Colles' fracture of left radius, initial encounter     Plan: Patient is maintained her position although she does angulation since she presented about 1 month after original fracture.  Continue current treatment return in 3 weeks for cast off x-rays out of cast and likely wrist splint application.  Follow-Up Instructions: Return in about 3 weeks (around 03/01/2019).   Orders:  Orders Placed This Encounter  Procedures  . XR Wrist 2 Views Left   No orders of the defined types were placed in this encounter.     Procedures: No procedures performed   Clinical Data: No additional findings.   Subjective: Chief Complaint  Patient presents with  . Left Wrist - Follow-up    Fall 01/18/2019    HPI follow-up distal radius fracture.  Review of Systems unchanged.   Objective: Vital Signs: Ht 5' (1.524 m)   Wt 83 lb (37.6 kg)   BMI 16.21 kg/m   Physical Exam unchanged.  Ortho Exam median ulnar sensation intact of the fingers.  Splint is in good position and alignment.  Specialty Comments:  No specialty comments available.  Imaging: No results found.   PMFS History: Patient Active Problem List   Diagnosis Date Noted  . Acute gastritis with hemorrhage   . Acute gastric ulcer with hemorrhage   . Acute upper GI bleed 01/19/2019  . Symptomatic anemia   . ST segment depression   . Elevated troponin   . Fracture, Colles, left, closed   . Fracture   . GI bleed 01/18/2019  . Acute on chronic systolic and diastolic heart failure, NYHA class 1 (HCC) 11/21/2018  . CKD (chronic kidney disease) stage 3, GFR 30-59 ml/min 11/21/2018  . CAD in native artery 11/21/2018  . HTN (hypertension)  11/21/2018  . HLD (hyperlipidemia) 11/21/2018  . SEMI (subendocardial myocardial infarction) (HCC) 11/19/2018  . Hx of CABG 02/18/2017  . Diastolic dysfunction 02/18/2017  . Pulmonary hypertension (HCC) 02/18/2017  . Smoker 02/18/2017  . Renal insufficiency 02/18/2017  . Dyslipidemia 02/18/2017  . Ischemic cardiomyopathy    Past Medical History:  Diagnosis Date  . Acute on chronic systolic and diastolic heart failure, NYHA class 1 (HCC) 11/21/2018  . Anxiety   . CAD (coronary artery disease)    a. s/p CABG x5 in 2000 b. cath in 02/2017 showing 2/5 patent grafts with patent LIMA-LAD, patent SVG-distal RCA, occluded SVG-D1 and occluded seq-SVG-OM1-OM2 with medical management recommended and possible PCI of distal RCA branches in the future if recurrent anginal symptoms  . CHF (congestive heart failure) (HCC)    a. EF 20-25% by echo in 12/2016 b. EF 45-50% by echo in 05/2017 c. EF 30% by echo in 11/2018  . Chronic bronchitis (HCC)   . CKD (chronic kidney disease) stage 3, GFR 30-59 ml/min 11/21/2018  . COPD (chronic obstructive pulmonary disease) (HCC)   . Dizziness   . Esophageal reflux   . Essential hypertension   . Fatigue   . HLD (hyperlipidemia) 11/21/2018  . HTN (hypertension) 11/21/2018  . Hypercholesteremia   . Hyperkalemia   .  Ovarian failure   . SOB (shortness of breath)     Family History  Problem Relation Age of Onset  . Heart attack Mother   . Hypertension Father     Past Surgical History:  Procedure Laterality Date  . ABDOMINAL HYSTERECTOMY    . APPENDECTOMY    . BIOPSY  01/22/2019   Procedure: BIOPSY;  Surgeon: Jerene Bears, MD;  Location: [facility];  Service: Gastroenterology;;  . BREAST BIOPSY Right   . CARDIAC CATHETERIZATION  2000  . CORONARY ARTERY BYPASS GRAFT  2000   CABG X5  . ESOPHAGOGASTRODUODENOSCOPY (EGD) WITH PROPOFOL N/A 01/22/2019   Procedure: ESOPHAGOGASTRODUODENOSCOPY (EGD) WITH PROPOFOL;  Surgeon: Jerene Bears, MD;  Location: [facility]  ENDOSCOPY;  Service: Gastroenterology;  Laterality: N/A;  . LEFT HEART CATH AND CORS/GRAFTS ANGIOGRAPHY N/A 02/17/2017   Procedure: LEFT HEART CATH AND CORS/GRAFTS ANGIOGRAPHY;  Surgeon: Burnell Blanks, MD;  Location: [place] CV LAB;  Service: Cardiovascular;  Laterality: N/A;  . LEFT HEART CATH AND CORS/GRAFTS ANGIOGRAPHY N/A 11/20/2018   Procedure: LEFT HEART CATH AND CORS/GRAFTS ANGIOGRAPHY;  Surgeon: Nelva Bush, MD;  Location: [place] CV LAB;  Service: Cardiovascular;  Laterality: N/A;   Social History   Occupational History  . Not on file  Tobacco Use  . Smoking status: Current Every Day Smoker    Packs/day: 1.00    Years: 50.00    Pack years: 50.00    Types: Cigarettes    Last attempt to quit: 02/22/2017    Years since quitting: 1.9  . Smokeless tobacco: Never Used  Substance and Sexual Activity  . Alcohol use: No  . Drug use: No  . Sexual activity: Never

## 2019-02-28 DIAGNOSIS — I10 Essential (primary) hypertension: Secondary | ICD-10-CM | POA: Diagnosis not present

## 2019-02-28 DIAGNOSIS — J449 Chronic obstructive pulmonary disease, unspecified: Secondary | ICD-10-CM | POA: Diagnosis not present

## 2019-03-01 ENCOUNTER — Encounter: Payer: Self-pay | Admitting: Orthopaedic Surgery

## 2019-03-01 ENCOUNTER — Ambulatory Visit (INDEPENDENT_AMBULATORY_CARE_PROVIDER_SITE_OTHER): Payer: Medicare PPO

## 2019-03-01 ENCOUNTER — Ambulatory Visit (INDEPENDENT_AMBULATORY_CARE_PROVIDER_SITE_OTHER): Payer: Medicare PPO | Admitting: Orthopaedic Surgery

## 2019-03-01 VITALS — BP 169/97 | HR 105 | Ht 60.0 in | Wt 83.0 lb

## 2019-03-01 DIAGNOSIS — S52532A Colles' fracture of left radius, initial encounter for closed fracture: Secondary | ICD-10-CM

## 2019-03-01 NOTE — Progress Notes (Signed)
Post-Op Visit Note   Patient: Susan [name]           Date of Birth: 05-21-1945           MRN: [id number] Visit Date: 03/01/2019 PCP: [name], MD   Assessment & Plan: Left distal radius fracture with angulation.  She took her splint off today she has it in a bag states she remove it so she can take a shower.  She still has some tenderness.  She notes that she is not been able to use the hand well when she tries to cook.  X-rays today shows residual dorsal angulation 30 to 40 degrees with shortening.  Callus formation noted.  Chief Complaint:  Chief Complaint  Patient presents with  . Left Wrist - Fracture, Follow-up    Fall 01/18/2019   Visit Diagnoses:  1. Closed Colles' fracture of left radius, initial encounter     Plan: Distal radius fracture presented late and despite reduction there was minimal improvement.  She is healed in a short and angulated position.  If she has persistent problems with this we discussed osteotomy and plate fixation would be required.  Risk splint applied that she can use intermittently and see how she does if she is continuing to have problems with the wrist she can return to discuss distal radius osteotomy and plating.  Follow-Up Instructions: No follow-ups on file.   Orders:  Orders Placed This Encounter  Procedures  . XR Wrist Complete Left   No orders of the defined types were placed in this encounter.   Imaging: XR Wrist Complete Left  Result Date: 03/01/2019 Three-view x-rays left wrist demonstrates distal radius fracture with subsidence impaction and angulation callus formation comparison to 01/18/2019 images. Impression: Partially healed distal radius fracture with deformity.   PMFS History: Patient Active Problem List   Diagnosis Date Noted  . Acute gastritis with hemorrhage   . Acute gastric ulcer with hemorrhage   . Acute upper GI bleed 01/19/2019  . Symptomatic anemia   . ST segment depression   . Elevated troponin   .  Fracture, Colles, left, closed   . Fracture   . GI bleed 01/18/2019  . Acute on chronic systolic and diastolic heart failure, NYHA class 1 ([place]) 11/21/2018  . CKD (chronic kidney disease) stage 3, GFR 30-59 ml/min 11/21/2018  . CAD in native artery 11/21/2018  . HTN (hypertension) 11/21/2018  . HLD (hyperlipidemia) 11/21/2018  . SEMI (subendocardial myocardial infarction) ([place]) 11/19/2018  . Hx of CABG 02/18/2017  . Diastolic dysfunction 97/02/6376  . Pulmonary hypertension ([place]) 02/18/2017  . Smoker 02/18/2017  . Renal insufficiency 02/18/2017  . Dyslipidemia 02/18/2017  . Ischemic cardiomyopathy    Past Medical History:  Diagnosis Date  . Acute on chronic systolic and diastolic heart failure, NYHA class 1 ([place]) 11/21/2018  . Anxiety   . CAD (coronary artery disease)    a. s/p CABG x5 in 2000 b. cath in 02/2017 showing 2/5 patent grafts with patent LIMA-LAD, patent SVG-distal RCA, occluded SVG-D1 and occluded seq-SVG-OM1-OM2 with medical management recommended and possible PCI of distal RCA branches in the future if recurrent anginal symptoms  . CHF (congestive heart failure) ([place])    a. EF 20-25% by echo in 12/2016 b. EF 45-50% by echo in 05/2017 c. EF 30% by echo in 11/2018  . Chronic bronchitis ([place])   . CKD (chronic kidney disease) stage 3, GFR 30-59 ml/min 11/21/2018  . COPD (chronic obstructive pulmonary disease) ([place])   .  Dizziness   . Esophageal reflux   . Essential hypertension   . Fatigue   . HLD (hyperlipidemia) 11/21/2018  . HTN (hypertension) 11/21/2018  . Hypercholesteremia   . Hyperkalemia   . Ovarian failure   . SOB (shortness of breath)     Family History  Problem Relation Age of Onset  . Heart attack Mother   . Hypertension Father     Past Surgical History:  Procedure Laterality Date  . ABDOMINAL HYSTERECTOMY    . APPENDECTOMY    . BIOPSY  01/22/2019   Procedure: BIOPSY;  Surgeon: Beverley Fiedler, MD;  Location: [facility] ENDOSCOPY;  Service:  Gastroenterology;;  . BREAST BIOPSY Right   . CARDIAC CATHETERIZATION  2000  . CORONARY ARTERY BYPASS GRAFT  2000   CABG X5  . ESOPHAGOGASTRODUODENOSCOPY (EGD) WITH PROPOFOL N/A 01/22/2019   Procedure: ESOPHAGOGASTRODUODENOSCOPY (EGD) WITH PROPOFOL;  Surgeon: Beverley Fiedler, MD;  Location: [facility] ENDOSCOPY;  Service: Gastroenterology;  Laterality: N/A;  . LEFT HEART CATH AND CORS/GRAFTS ANGIOGRAPHY N/A 02/17/2017   Procedure: LEFT HEART CATH AND CORS/GRAFTS ANGIOGRAPHY;  Surgeon: Kathleene Hazel, MD;  Location: [facility] INVASIVE CV LAB;  Service: Cardiovascular;  Laterality: N/A;  . LEFT HEART CATH AND CORS/GRAFTS ANGIOGRAPHY N/A 11/20/2018   Procedure: LEFT HEART CATH AND CORS/GRAFTS ANGIOGRAPHY;  Surgeon: Yvonne Kendall, MD;  Location: [facility] INVASIVE CV LAB;  Service: Cardiovascular;  Laterality: N/A;   Social History   Occupational History  . Not on file  Tobacco Use  . Smoking status: Current Every Day Smoker    Packs/day: 1.00    Years: 50.00    Pack years: 50.00    Types: Cigarettes    Last attempt to quit: 02/22/2017    Years since quitting: 2.0  . Smokeless tobacco: Never Used  Substance and Sexual Activity  . Alcohol use: No  . Drug use: No  . Sexual activity: Never

## 2019-03-15 ENCOUNTER — Encounter: Payer: Self-pay | Admitting: Cardiovascular Disease

## 2019-03-15 ENCOUNTER — Telehealth (INDEPENDENT_AMBULATORY_CARE_PROVIDER_SITE_OTHER): Payer: Medicare PPO | Admitting: Cardiovascular Disease

## 2019-03-15 VITALS — Ht 60.0 in | Wt 76.0 lb

## 2019-03-15 DIAGNOSIS — Z720 Tobacco use: Secondary | ICD-10-CM

## 2019-03-15 DIAGNOSIS — I255 Ischemic cardiomyopathy: Secondary | ICD-10-CM

## 2019-03-15 DIAGNOSIS — I5022 Chronic systolic (congestive) heart failure: Secondary | ICD-10-CM

## 2019-03-15 DIAGNOSIS — I519 Heart disease, unspecified: Secondary | ICD-10-CM

## 2019-03-15 DIAGNOSIS — I10 Essential (primary) hypertension: Secondary | ICD-10-CM

## 2019-03-15 DIAGNOSIS — I5189 Other ill-defined heart diseases: Secondary | ICD-10-CM

## 2019-03-15 DIAGNOSIS — K922 Gastrointestinal hemorrhage, unspecified: Secondary | ICD-10-CM

## 2019-03-15 DIAGNOSIS — I25708 Atherosclerosis of coronary artery bypass graft(s), unspecified, with other forms of angina pectoris: Secondary | ICD-10-CM

## 2019-03-15 NOTE — Patient Instructions (Signed)
Medication Instructions:   Stop the Lopresor (Metoprolol Tart).  Continue the Metoprolol Succ (Toprol XL) at 12.5mg  daily.  Continue all other medications.    Labwork: none  Testing/Procedures: none  Follow-Up: 3 months   Any Other Special Instructions Will Be Listed Below (If Applicable). Purchase a new blood pressure cuff if possible.  If you need a refill on your cardiac medications before your next appointment, please call your pharmacy.

## 2019-03-15 NOTE — Progress Notes (Signed)
Virtual Visit via Telephone Note   This visit type was conducted due to national recommendations for restrictions regarding the COVID-19 Pandemic (e.g. social distancing) in an effort to limit this patient's exposure and mitigate transmission in our community.  Due to her co-morbid illnesses, this patient is at least at moderate risk for complications without adequate follow up.  This format is felt to be most appropriate for this patient at this time.  The patient did not have access to video technology/had technical difficulties with video requiring transitioning to audio format only (telephone).  All issues noted in this document were discussed and addressed.  No physical exam could be performed with this format.  Please refer to the patient's chart for her  consent to telehealth for [facility].   Date:  03/15/2019   ID:  [name], DOB [id number], MRN [id number]  Patient Location: Home Provider Location: Office  PCP:  [name], MD  Cardiologist:  Kate Sable, MD  Electrophysiologist:  None   Evaluation Performed:  Follow-Up Visit  Chief Complaint: Posthospitalization follow-up  History of Present Illness:    Susan [name] is a 74 y.o. female with history of coronary artery disease, non-STEMI, and GI bleed.  I last saw her on 12/11/2018 at which time she was doing well.  She underwent a cardiac catheterization on 11/20/2018 which demonstrated overall stable appearance of the coronary vessels and grafts.  Medical management was recommended with dual antiplatelet therapy for a minimum of 12 months if tolerated.  She was then hospitalized in January 2021 with acute anemia secondary to GI bleed.  She underwent an EGD on 01/22/2019 which showed ulcerative gastritis with no active bleeding.  GI recommended twice daily Protonix.  She had been chronically taking NSAIDs and was told to avoid them altogether.  Follow-up echocardiogram on 01/19/2019 demonstrated severely reduced  LV systolic function, EF 25 to 30%.  She was not volume overloaded.  She has had multiple medication changes even since hospital discharge.  She is not taking clopidogrel, nitrates, or Toprol-XL.  She is taking Lopressor.  She denies exertional chest pain and dyspnea.  She occasionally has some mild ankle swelling alleviated with diuretics.  She denies hematochezia and melena.  She is smoking about 4 to 5 cigarettes daily.  She used to smoke 1/2 pack of cigarettes daily.    Past Medical History:  Diagnosis Date  . Acute on chronic systolic and diastolic heart failure, NYHA class 1 ([place]) 11/21/2018  . Anxiety   . CAD (coronary artery disease)    a. s/p CABG x5 in 2000 b. cath in 02/2017 showing 2/5 patent grafts with patent LIMA-LAD, patent SVG-distal RCA, occluded SVG-D1 and occluded seq-SVG-OM1-OM2 with medical management recommended and possible PCI of distal RCA branches in the future if recurrent anginal symptoms  . CHF (congestive heart failure) ([place])    a. EF 20-25% by echo in 12/2016 b. EF 45-50% by echo in 05/2017 c. EF 30% by echo in 11/2018  . Chronic bronchitis ([place])   . CKD (chronic kidney disease) stage 3, GFR 30-59 ml/min 11/21/2018  . COPD (chronic obstructive pulmonary disease) ([place])   . Dizziness   . Esophageal reflux   . Essential hypertension   . Fatigue   . HLD (hyperlipidemia) 11/21/2018  . HTN (hypertension) 11/21/2018  . Hypercholesteremia   . Hyperkalemia   . Ovarian failure   . SOB (shortness of breath)    Past Surgical History:  Procedure Laterality Date  . ABDOMINAL HYSTERECTOMY    .  APPENDECTOMY    . BIOPSY  01/22/2019   Procedure: BIOPSY;  Surgeon: Beverley Fiedler, MD;  Location: [facility] ENDOSCOPY;  Service: Gastroenterology;;  . BREAST BIOPSY Right   . CARDIAC CATHETERIZATION  2000  . CORONARY ARTERY BYPASS GRAFT  2000   CABG X5  . ESOPHAGOGASTRODUODENOSCOPY (EGD) WITH PROPOFOL N/A 01/22/2019   Procedure: ESOPHAGOGASTRODUODENOSCOPY (EGD) WITH PROPOFOL;   Surgeon: Beverley Fiedler, MD;  Location: [facility] ENDOSCOPY;  Service: Gastroenterology;  Laterality: N/A;  . LEFT HEART CATH AND CORS/GRAFTS ANGIOGRAPHY N/A 02/17/2017   Procedure: LEFT HEART CATH AND CORS/GRAFTS ANGIOGRAPHY;  Surgeon: Kathleene Hazel, MD;  Location: [facility] INVASIVE CV LAB;  Service: Cardiovascular;  Laterality: N/A;  . LEFT HEART CATH AND CORS/GRAFTS ANGIOGRAPHY N/A 11/20/2018   Procedure: LEFT HEART CATH AND CORS/GRAFTS ANGIOGRAPHY;  Surgeon: Yvonne Kendall, MD;  Location: [facility] INVASIVE CV LAB;  Service: Cardiovascular;  Laterality: N/A;     No outpatient medications have been marked as taking for the 03/15/19 encounter (Telemedicine) with Laqueta Linden, MD.     Allergies:   Patient has no known allergies.   Social History   Tobacco Use  . Smoking status: Current Every Day Smoker    Packs/day: 1.00    Years: 50.00    Pack years: 50.00    Types: Cigarettes    Last attempt to quit: 02/22/2017    Years since quitting: 2.0  . Smokeless tobacco: Never Used  . Tobacco comment: 4-5 ciggs per day   Substance Use Topics  . Alcohol use: No  . Drug use: No     Family Hx: The patient's family history includes Heart attack in her mother; Hypertension in her father.  ROS:   Please see the history of present illness.     All other systems reviewed and are negative.   Prior CV studies:   The following studies were reviewed today:   Echocardiogram 01/19/2019:  1. Left ventricular ejection fraction, by visual estimation, is 25 to  30%. The left ventricle has severely decreased function. Left ventricular  septal wall thickness was normal.  2. Left ventricular diastolic function could not be evaluated.  3. Severely dilated left ventricular internal cavity size.  4. Findings consistent with multivessel CAD with prior inferior and  anterior (Septal /apical) wall infarcts findings similar to those on echo  November 2020.  5. Global right ventricle has normal systolc  function.The right  ventricular size is normal. no increase in right ventricular wall  thickness.  6. Left atrial size was mildly dilated.  7. The tricuspid valve was not assessed.  8. Mild aortic valve sclerosis without stenosis.  9. Aortic root could not be assessed.  10. ? prominent chiarri network at dome of RA in 4 chamber view.    11/20/18 Cardiac cath Conclusions: 1. Overall stable appearance of the coronary arteries with severe three-vessel coronary artery disease, including chronic total occlusions of the mid LAD, mid LCx, and proximal RCA. 2. Widely patent LIMA-LAD. 3. Patent SVG-distal RCA with up to 50% stenosis in the mid graft. Bifurcation disease involving the distal RCA is similar to prior catheterization, with 90% lesions involving the ostial/proximal rPDA and ostial/proximal RCA continuation. 4. Normal left ventricular filling pressure.  Recommendations: 1. Medical optimization, including escalation of evidence-based heart failure therapy, improved blood pressure control, and smoking cessation. 2. Dual antiplatelet therapy with aspirin and clopidogrel for at least 12 months, if tolerated. 3. If symptoms recur, complex PCI to the distal RCA bifurcation will need to be  considered. As previously noted, this may requiring "kissing stents" at the ostia of the rPDA and RCA continuation. This would be a potentially high risk procedure, given the territory supplied (via native vessels and collaterals).  Labs/Other Tests and Data Reviewed:    EKG:  An ECG dated 01/19/2019 was personally reviewed today and demonstrated:  Sinus tachycardia with diffuse ST segment depressions inferiorly and V3 through V6 with LVH  Recent Labs: 01/18/2019: B Natriuretic Peptide 496.0 01/23/2019: ALT 30 01/24/2019: BUN 16; Creatinine, Ser 1.19; Hemoglobin 8.4; Magnesium 1.9; Platelets 276; Potassium 3.4; Sodium 133   Recent Lipid Panel Lab Results  Component Value Date/Time   CHOL 198  11/20/2018 05:16 AM   TRIG 114 11/20/2018 05:16 AM   HDL 77 11/20/2018 05:16 AM   CHOLHDL 2.6 11/20/2018 05:16 AM   LDLCALC 98 11/20/2018 05:16 AM    Wt Readings from Last 3 Encounters:  03/15/19 76 lb (34.5 kg)  03/01/19 83 lb (37.6 kg)  02/08/19 83 lb (37.6 kg)     Objective:    Vital Signs:  Ht 5' (1.524 m)   Wt 76 lb (34.5 kg)   BMI 14.84 kg/m    VITAL SIGNS:  reviewed  ASSESSMENT & PLAN:    1.  Chronic systolic heart failure: Cardiac catheterization and echocardiogram reviewed above.  LVEF 25-30%.    She is taking Lopressor and no longer on Toprol-XL.  She is taking hydralazine but no longer on nitrates.  I will switch Lopressor to Toprol-XL 12.5 mg daily.  She does not have a blood pressure cuff that works so we Therapist, music a new one.  Continue Lasix 40 mg daily.  No signs of hypervolemia. Low-dose lisinopril previously led to dizziness and hypotension and thus I have elected not to pursue ACE inhibitor's or angiotensin receptor blockers.  She has tolerated hydralazine.  I instructed her to take an extra dose of Lasix and potassium for weight gain of 3 pounds in 24 hours.  2.  Coronary artery disease: History of CABG.  She denies anginal symptoms.  Cardiac catheterization results reviewed above.  Continue aspirin 81 mg.  No clopidogrel given recent GI bleed.    I will switch Lopressor to Toprol-XL 12.5 mg daily.  Continue rosuvastatin.  3.  Hypertension: No changes to therapy. She does not have a blood pressure cuff that works so we Therapist, music a new one.  4.  Tobacco abuse disorder: She smokes 4-5 cigarettes daily. Previously did not tolerate Chantix.  It has been reinforced that she needs to quit smoking.  5. GI bleed: No recurrences. Last Hgb 8.4 on 01/24/19. On ferrous sulfate.    COVID-19 Education: The signs and symptoms of COVID-19 were discussed with the patient and how to seek care for testing (follow up with PCP or arrange E-visit).  The  importance of social distancing was discussed today.  Time:   Today, I have spent 25 minutes with the patient with telehealth technology discussing the above problems.     Medication Adjustments/Labs and Tests Ordered: Current medicines are reviewed at length with the patient today.  Concerns regarding medicines are outlined above.   Tests Ordered: No orders of the defined types were placed in this encounter.   Medication Changes: No orders of the defined types were placed in this encounter.   Follow Up:  Virtual Visit  in 3 month(s)  Signed, Prentice Docker, MD  03/15/2019 8:54 AM    [facility]

## 2019-03-15 NOTE — Addendum Note (Signed)
Addended by: Lesle Chris on: 03/15/2019 12:34 PM   Modules accepted: Orders

## 2019-04-03 DIAGNOSIS — I10 Essential (primary) hypertension: Secondary | ICD-10-CM | POA: Diagnosis not present

## 2019-04-03 DIAGNOSIS — J449 Chronic obstructive pulmonary disease, unspecified: Secondary | ICD-10-CM | POA: Diagnosis not present

## 2019-04-12 DIAGNOSIS — I10 Essential (primary) hypertension: Secondary | ICD-10-CM | POA: Diagnosis not present

## 2019-04-12 DIAGNOSIS — J449 Chronic obstructive pulmonary disease, unspecified: Secondary | ICD-10-CM | POA: Diagnosis not present

## 2019-04-16 DIAGNOSIS — Z7982 Long term (current) use of aspirin: Secondary | ICD-10-CM | POA: Diagnosis not present

## 2019-04-16 DIAGNOSIS — K922 Gastrointestinal hemorrhage, unspecified: Secondary | ICD-10-CM | POA: Diagnosis not present

## 2019-04-16 DIAGNOSIS — J439 Emphysema, unspecified: Secondary | ICD-10-CM | POA: Diagnosis not present

## 2019-04-16 DIAGNOSIS — I509 Heart failure, unspecified: Secondary | ICD-10-CM | POA: Diagnosis not present

## 2019-04-16 DIAGNOSIS — I130 Hypertensive heart and chronic kidney disease with heart failure and stage 1 through stage 4 chronic kidney disease, or unspecified chronic kidney disease: Secondary | ICD-10-CM | POA: Diagnosis not present

## 2019-04-16 DIAGNOSIS — F17200 Nicotine dependence, unspecified, uncomplicated: Secondary | ICD-10-CM | POA: Diagnosis not present

## 2019-04-16 DIAGNOSIS — R000 Tachycardia, unspecified: Secondary | ICD-10-CM | POA: Diagnosis not present

## 2019-04-16 DIAGNOSIS — F17210 Nicotine dependence, cigarettes, uncomplicated: Secondary | ICD-10-CM | POA: Diagnosis not present

## 2019-04-16 DIAGNOSIS — E871 Hypo-osmolality and hyponatremia: Secondary | ICD-10-CM | POA: Diagnosis not present

## 2019-04-16 DIAGNOSIS — I110 Hypertensive heart disease with heart failure: Secondary | ICD-10-CM | POA: Diagnosis not present

## 2019-04-16 DIAGNOSIS — Z20822 Contact with and (suspected) exposure to covid-19: Secondary | ICD-10-CM | POA: Diagnosis not present

## 2019-04-16 DIAGNOSIS — S2232XA Fracture of one rib, left side, initial encounter for closed fracture: Secondary | ICD-10-CM | POA: Diagnosis not present

## 2019-04-16 DIAGNOSIS — Z79899 Other long term (current) drug therapy: Secondary | ICD-10-CM | POA: Diagnosis not present

## 2019-04-16 DIAGNOSIS — I5023 Acute on chronic systolic (congestive) heart failure: Secondary | ICD-10-CM | POA: Diagnosis not present

## 2019-04-16 DIAGNOSIS — J441 Chronic obstructive pulmonary disease with (acute) exacerbation: Secondary | ICD-10-CM | POA: Diagnosis not present

## 2019-04-16 DIAGNOSIS — S2232XD Fracture of one rib, left side, subsequent encounter for fracture with routine healing: Secondary | ICD-10-CM | POA: Diagnosis not present

## 2019-04-16 DIAGNOSIS — Z951 Presence of aortocoronary bypass graft: Secondary | ICD-10-CM | POA: Diagnosis not present

## 2019-04-16 DIAGNOSIS — J811 Chronic pulmonary edema: Secondary | ICD-10-CM | POA: Diagnosis not present

## 2019-04-16 DIAGNOSIS — Z720 Tobacco use: Secondary | ICD-10-CM | POA: Diagnosis not present

## 2019-04-16 DIAGNOSIS — J449 Chronic obstructive pulmonary disease, unspecified: Secondary | ICD-10-CM | POA: Diagnosis not present

## 2019-04-16 DIAGNOSIS — I4891 Unspecified atrial fibrillation: Secondary | ICD-10-CM | POA: Diagnosis not present

## 2019-04-18 DIAGNOSIS — Z20822 Contact with and (suspected) exposure to covid-19: Secondary | ICD-10-CM | POA: Diagnosis not present

## 2019-04-18 DIAGNOSIS — K922 Gastrointestinal hemorrhage, unspecified: Secondary | ICD-10-CM | POA: Diagnosis not present

## 2019-04-18 DIAGNOSIS — Z720 Tobacco use: Secondary | ICD-10-CM | POA: Diagnosis not present

## 2019-04-18 DIAGNOSIS — J441 Chronic obstructive pulmonary disease with (acute) exacerbation: Secondary | ICD-10-CM | POA: Diagnosis not present

## 2019-04-19 DIAGNOSIS — J439 Emphysema, unspecified: Secondary | ICD-10-CM | POA: Diagnosis not present

## 2019-04-19 DIAGNOSIS — J811 Chronic pulmonary edema: Secondary | ICD-10-CM | POA: Diagnosis not present

## 2019-04-19 DIAGNOSIS — J449 Chronic obstructive pulmonary disease, unspecified: Secondary | ICD-10-CM | POA: Diagnosis not present

## 2019-04-19 DIAGNOSIS — I5023 Acute on chronic systolic (congestive) heart failure: Secondary | ICD-10-CM | POA: Diagnosis not present

## 2019-04-19 DIAGNOSIS — E871 Hypo-osmolality and hyponatremia: Secondary | ICD-10-CM | POA: Diagnosis not present

## 2019-04-19 DIAGNOSIS — F17200 Nicotine dependence, unspecified, uncomplicated: Secondary | ICD-10-CM | POA: Diagnosis not present

## 2019-04-19 DIAGNOSIS — I4891 Unspecified atrial fibrillation: Secondary | ICD-10-CM | POA: Diagnosis not present

## 2019-04-19 DIAGNOSIS — Z7982 Long term (current) use of aspirin: Secondary | ICD-10-CM | POA: Diagnosis not present

## 2019-04-19 DIAGNOSIS — Z951 Presence of aortocoronary bypass graft: Secondary | ICD-10-CM | POA: Diagnosis not present

## 2019-04-19 DIAGNOSIS — K921 Melena: Secondary | ICD-10-CM | POA: Diagnosis not present

## 2019-04-19 DIAGNOSIS — I130 Hypertensive heart and chronic kidney disease with heart failure and stage 1 through stage 4 chronic kidney disease, or unspecified chronic kidney disease: Secondary | ICD-10-CM | POA: Diagnosis not present

## 2019-04-19 DIAGNOSIS — Z20822 Contact with and (suspected) exposure to covid-19: Secondary | ICD-10-CM | POA: Diagnosis not present

## 2019-04-20 DIAGNOSIS — Z951 Presence of aortocoronary bypass graft: Secondary | ICD-10-CM | POA: Diagnosis not present

## 2019-04-20 DIAGNOSIS — I5023 Acute on chronic systolic (congestive) heart failure: Secondary | ICD-10-CM | POA: Diagnosis not present

## 2019-04-20 DIAGNOSIS — Z20822 Contact with and (suspected) exposure to covid-19: Secondary | ICD-10-CM | POA: Diagnosis not present

## 2019-04-20 DIAGNOSIS — I2510 Atherosclerotic heart disease of native coronary artery without angina pectoris: Secondary | ICD-10-CM | POA: Diagnosis not present

## 2019-04-20 DIAGNOSIS — I4891 Unspecified atrial fibrillation: Secondary | ICD-10-CM | POA: Diagnosis not present

## 2019-04-20 DIAGNOSIS — I130 Hypertensive heart and chronic kidney disease with heart failure and stage 1 through stage 4 chronic kidney disease, or unspecified chronic kidney disease: Secondary | ICD-10-CM | POA: Diagnosis not present

## 2019-04-20 DIAGNOSIS — J449 Chronic obstructive pulmonary disease, unspecified: Secondary | ICD-10-CM | POA: Diagnosis not present

## 2019-04-20 DIAGNOSIS — F17200 Nicotine dependence, unspecified, uncomplicated: Secondary | ICD-10-CM | POA: Diagnosis not present

## 2019-04-20 DIAGNOSIS — N189 Chronic kidney disease, unspecified: Secondary | ICD-10-CM | POA: Diagnosis not present

## 2019-04-20 DIAGNOSIS — Z7982 Long term (current) use of aspirin: Secondary | ICD-10-CM | POA: Diagnosis not present

## 2019-04-20 DIAGNOSIS — E871 Hypo-osmolality and hyponatremia: Secondary | ICD-10-CM | POA: Diagnosis not present

## 2019-04-20 DIAGNOSIS — K921 Melena: Secondary | ICD-10-CM | POA: Diagnosis not present

## 2019-04-21 DIAGNOSIS — Z7902 Long term (current) use of antithrombotics/antiplatelets: Secondary | ICD-10-CM | POA: Diagnosis not present

## 2019-04-21 DIAGNOSIS — F17200 Nicotine dependence, unspecified, uncomplicated: Secondary | ICD-10-CM | POA: Diagnosis not present

## 2019-04-21 DIAGNOSIS — Z9114 Patient's other noncompliance with medication regimen: Secondary | ICD-10-CM | POA: Diagnosis not present

## 2019-04-21 DIAGNOSIS — R0602 Shortness of breath: Secondary | ICD-10-CM | POA: Diagnosis not present

## 2019-04-21 DIAGNOSIS — Z720 Tobacco use: Secondary | ICD-10-CM | POA: Diagnosis not present

## 2019-04-21 DIAGNOSIS — I4891 Unspecified atrial fibrillation: Secondary | ICD-10-CM | POA: Diagnosis not present

## 2019-04-21 DIAGNOSIS — R404 Transient alteration of awareness: Secondary | ICD-10-CM | POA: Diagnosis not present

## 2019-04-21 DIAGNOSIS — I10 Essential (primary) hypertension: Secondary | ICD-10-CM | POA: Diagnosis not present

## 2019-04-21 DIAGNOSIS — R069 Unspecified abnormalities of breathing: Secondary | ICD-10-CM | POA: Diagnosis not present

## 2019-04-21 DIAGNOSIS — Z7982 Long term (current) use of aspirin: Secondary | ICD-10-CM | POA: Diagnosis not present

## 2019-04-21 DIAGNOSIS — I509 Heart failure, unspecified: Secondary | ICD-10-CM | POA: Diagnosis not present

## 2019-04-21 DIAGNOSIS — R410 Disorientation, unspecified: Secondary | ICD-10-CM | POA: Diagnosis not present

## 2019-04-21 DIAGNOSIS — R918 Other nonspecific abnormal finding of lung field: Secondary | ICD-10-CM | POA: Diagnosis not present

## 2019-04-21 DIAGNOSIS — J449 Chronic obstructive pulmonary disease, unspecified: Secondary | ICD-10-CM | POA: Diagnosis not present

## 2019-04-21 DIAGNOSIS — I209 Angina pectoris, unspecified: Secondary | ICD-10-CM | POA: Diagnosis not present

## 2019-04-21 DIAGNOSIS — I110 Hypertensive heart disease with heart failure: Secondary | ICD-10-CM | POA: Diagnosis not present

## 2019-04-21 DIAGNOSIS — Z79899 Other long term (current) drug therapy: Secondary | ICD-10-CM | POA: Diagnosis not present

## 2019-04-27 DIAGNOSIS — I5022 Chronic systolic (congestive) heart failure: Secondary | ICD-10-CM | POA: Diagnosis not present

## 2019-04-27 DIAGNOSIS — F17210 Nicotine dependence, cigarettes, uncomplicated: Secondary | ICD-10-CM | POA: Diagnosis not present

## 2019-04-27 DIAGNOSIS — I10 Essential (primary) hypertension: Secondary | ICD-10-CM | POA: Diagnosis not present

## 2019-04-27 DIAGNOSIS — J449 Chronic obstructive pulmonary disease, unspecified: Secondary | ICD-10-CM | POA: Diagnosis not present

## 2019-05-01 ENCOUNTER — Encounter: Payer: Self-pay | Admitting: Family Medicine

## 2019-05-02 ENCOUNTER — Encounter (HOSPITAL_COMMUNITY): Payer: Self-pay

## 2019-05-02 ENCOUNTER — Observation Stay (HOSPITAL_COMMUNITY)
Admission: EM | Admit: 2019-05-02 | Discharge: 2019-05-04 | Disposition: A | Discharge status: Home / Self Care | Payer: Medicare PPO | Attending: Family Medicine | Admitting: Family Medicine

## 2019-05-02 ENCOUNTER — Other Ambulatory Visit: Payer: Self-pay

## 2019-05-02 ENCOUNTER — Emergency Department (HOSPITAL_COMMUNITY): Payer: Medicare PPO

## 2019-05-02 DIAGNOSIS — I110 Hypertensive heart disease with heart failure: Secondary | ICD-10-CM | POA: Diagnosis not present

## 2019-05-02 DIAGNOSIS — I130 Hypertensive heart and chronic kidney disease with heart failure and stage 1 through stage 4 chronic kidney disease, or unspecified chronic kidney disease: Secondary | ICD-10-CM | POA: Diagnosis not present

## 2019-05-02 DIAGNOSIS — Z79899 Other long term (current) drug therapy: Secondary | ICD-10-CM | POA: Diagnosis not present

## 2019-05-02 DIAGNOSIS — J449 Chronic obstructive pulmonary disease, unspecified: Secondary | ICD-10-CM | POA: Diagnosis not present

## 2019-05-02 DIAGNOSIS — F17210 Nicotine dependence, cigarettes, uncomplicated: Secondary | ICD-10-CM | POA: Diagnosis not present

## 2019-05-02 DIAGNOSIS — E782 Mixed hyperlipidemia: Secondary | ICD-10-CM | POA: Insufficient documentation

## 2019-05-02 DIAGNOSIS — I252 Old myocardial infarction: Secondary | ICD-10-CM | POA: Diagnosis not present

## 2019-05-02 DIAGNOSIS — I509 Heart failure, unspecified: Secondary | ICD-10-CM | POA: Diagnosis not present

## 2019-05-02 DIAGNOSIS — N183 Chronic kidney disease, stage 3 unspecified: Secondary | ICD-10-CM | POA: Insufficient documentation

## 2019-05-02 DIAGNOSIS — I2510 Atherosclerotic heart disease of native coronary artery without angina pectoris: Secondary | ICD-10-CM | POA: Diagnosis not present

## 2019-05-02 DIAGNOSIS — J810 Acute pulmonary edema: Secondary | ICD-10-CM | POA: Diagnosis not present

## 2019-05-02 DIAGNOSIS — Z951 Presence of aortocoronary bypass graft: Secondary | ICD-10-CM | POA: Insufficient documentation

## 2019-05-02 DIAGNOSIS — Z7982 Long term (current) use of aspirin: Secondary | ICD-10-CM | POA: Diagnosis not present

## 2019-05-02 DIAGNOSIS — E785 Hyperlipidemia, unspecified: Secondary | ICD-10-CM | POA: Diagnosis present

## 2019-05-02 DIAGNOSIS — Z20822 Contact with and (suspected) exposure to covid-19: Secondary | ICD-10-CM | POA: Diagnosis not present

## 2019-05-02 DIAGNOSIS — R0602 Shortness of breath: Secondary | ICD-10-CM

## 2019-05-02 DIAGNOSIS — I5043 Acute on chronic combined systolic (congestive) and diastolic (congestive) heart failure: Secondary | ICD-10-CM | POA: Insufficient documentation

## 2019-05-02 DIAGNOSIS — I255 Ischemic cardiomyopathy: Secondary | ICD-10-CM | POA: Diagnosis present

## 2019-05-02 DIAGNOSIS — I5023 Acute on chronic systolic (congestive) heart failure: Secondary | ICD-10-CM | POA: Diagnosis not present

## 2019-05-02 DIAGNOSIS — R778 Other specified abnormalities of plasma proteins: Secondary | ICD-10-CM | POA: Diagnosis present

## 2019-05-02 DIAGNOSIS — I2722 Pulmonary hypertension due to left heart disease: Secondary | ICD-10-CM | POA: Insufficient documentation

## 2019-05-02 DIAGNOSIS — I272 Pulmonary hypertension, unspecified: Secondary | ICD-10-CM | POA: Diagnosis present

## 2019-05-02 DIAGNOSIS — I10 Essential (primary) hypertension: Secondary | ICD-10-CM | POA: Diagnosis present

## 2019-05-02 LAB — CBC WITH DIFFERENTIAL/PLATELET
Abs Immature Granulocytes: 0.03 10*3/uL (ref 0.00–0.07)
Basophils Absolute: 0 10*3/uL (ref 0.0–0.1)
Basophils Relative: 1 %
Eosinophils Absolute: 0.1 10*3/uL (ref 0.0–0.5)
Eosinophils Relative: 1 %
HCT: 43.4 % (ref 36.0–46.0)
Hemoglobin: 14.3 g/dL (ref 12.0–15.0)
Immature Granulocytes: 1 %
Lymphocytes Relative: 16 %
Lymphs Abs: 1.1 10*3/uL (ref 0.7–4.0)
MCH: 33.3 pg (ref 26.0–34.0)
MCHC: 32.9 g/dL (ref 30.0–36.0)
MCV: 101.2 fL — ABNORMAL HIGH (ref 80.0–100.0)
Monocytes Absolute: 0.7 10*3/uL (ref 0.1–1.0)
Monocytes Relative: 10 %
Neutro Abs: 4.7 10*3/uL (ref 1.7–7.7)
Neutrophils Relative %: 71 %
Platelets: 250 10*3/uL (ref 150–400)
RBC: 4.29 MIL/uL (ref 3.87–5.11)
RDW: 14.8 % (ref 11.5–15.5)
WBC: 6.5 10*3/uL (ref 4.0–10.5)
nRBC: 0 % (ref 0.0–0.2)

## 2019-05-02 LAB — COMPREHENSIVE METABOLIC PANEL
ALT: 19 U/L (ref 0–44)
AST: 21 U/L (ref 15–41)
Albumin: 3.1 g/dL — ABNORMAL LOW (ref 3.5–5.0)
Alkaline Phosphatase: 71 U/L (ref 38–126)
Anion gap: 10 (ref 5–15)
BUN: 13 mg/dL (ref 8–23)
CO2: 26 mmol/L (ref 22–32)
Calcium: 8.5 mg/dL — ABNORMAL LOW (ref 8.9–10.3)
Chloride: 92 mmol/L — ABNORMAL LOW (ref 98–111)
Creatinine, Ser: 1.1 mg/dL — ABNORMAL HIGH (ref 0.44–1.00)
GFR calc Af Amer: 57 mL/min — ABNORMAL LOW (ref >60)
GFR calc non Af Amer: 49 mL/min — ABNORMAL LOW (ref >60)
Glucose, Bld: 122 mg/dL — ABNORMAL HIGH (ref 70–99)
Potassium: 3.9 mmol/L (ref 3.5–5.1)
Sodium: 128 mmol/L — ABNORMAL LOW (ref 135–145)
Total Bilirubin: 0.5 mg/dL (ref 0.3–1.2)
Total Protein: 6.4 g/dL — ABNORMAL LOW (ref 6.5–8.1)

## 2019-05-02 LAB — RESPIRATORY PANEL BY RT PCR (FLU A&B, COVID)
Influenza A by PCR: NEGATIVE
Influenza B by PCR: NEGATIVE
SARS Coronavirus 2 by RT PCR: NEGATIVE

## 2019-05-02 LAB — BRAIN NATRIURETIC PEPTIDE: B Natriuretic Peptide: >4500 pg/mL — ABNORMAL HIGH (ref 0.0–100.0)

## 2019-05-02 LAB — TROPONIN I (HIGH SENSITIVITY)
Troponin I (High Sensitivity): 49 ng/L — ABNORMAL HIGH (ref <18)
Troponin I (High Sensitivity): 47 ng/L — ABNORMAL HIGH (ref <18)

## 2019-05-02 MED ORDER — NITROGLYCERIN 2 % TD OINT
0.5000 [in_us] | TOPICAL_OINTMENT | Freq: Four times a day (QID) | TRANSDERMAL | Status: DC
  Administered 2019-05-02 – 2019-05-03 (×4): 0.5 [in_us] via TOPICAL
  Filled 2019-05-02 (×4): qty 1

## 2019-05-02 MED ORDER — FUROSEMIDE 10 MG/ML IJ SOLN
40.0000 mg | Freq: Once | INTRAMUSCULAR | Status: AC
  Administered 2019-05-02: 40 mg via INTRAVENOUS
  Filled 2019-05-02: qty 4

## 2019-05-02 NOTE — ED Provider Notes (Signed)
This patient is a 74 year old female presenting in respiratory distress with increased edema, JVD and pulmonary rales.  She is not severely hypoxic but is dyspneic, speaking in shortened sentences and with mild accessory muscle use.  She has bilateral 3+ symmetrical pitting edema of her legs, JVD present in the semirecumbent position.  We will get an x-ray, labs, EKG, troponin, the patient will likely need to be admitted for CHF exacerbation.  Prior chart reviewed showing that the patient has a history of congestive heart failure with a low ejection fraction.  The patient is agreeable to the plan.  Lasix Nitro Admit  I discussed the case with hospitalist who will admit   EKG Interpretation  Date/Time:  Wednesday May 02 2019 18:26:23 EDT Ventricular Rate:  105 PR Interval:    QRS Duration: 104 QT Interval:  345 QTC Calculation: 456 R Axis:   -15 Text Interpretation: Sinus tachycardia Multiform ventricular premature complexes Biatrial enlargement LVH with secondary repolarization abnormality Anterior ST elevation, probably due to LVH Since last tracing irregularity now present Confirmed by Eber Hong ([id number]) on 05/02/2019 6:34:20 PM      .Critical Care Performed by: Eber Hong, MD Authorized by: Eber Hong, MD   Critical care provider statement:    Critical care time (minutes):  35   Critical care time was exclusive of:  Separately billable procedures and treating other patients and teaching time   Critical care was necessary to treat or prevent imminent or life-threatening deterioration of the following conditions:  Cardiac failure   Critical care was time spent personally by me on the following activities:  Blood draw for specimens, development of treatment plan with patient or surrogate, discussions with consultants, evaluation of patient's response to treatment, examination of patient, obtaining history from patient or surrogate, ordering and performing treatments and  interventions, ordering and review of laboratory studies, ordering and review of radiographic studies, pulse oximetry, re-evaluation of patient's condition and review of old charts     Medical screening examination/treatment/procedure(s) were conducted as a shared visit with non-physician practitioner(s) and myself.  I personally evaluated the patient during the encounter.  Clinical Impression:   Final diagnoses:  SOB (shortness of breath)  Acute on chronic congestive heart failure, unspecified heart failure type (HCC)  Acute pulmonary edema (HCC)         Eber Hong, MD 05/02/19 2001

## 2019-05-02 NOTE — ED Provider Notes (Signed)
[facility] EMERGENCY DEPARTMENT Provider Note   CSN: [id number] Arrival date & time: 05/02/19  1815     History No chief complaint on file.   Susan [name] is a 74 y.o. female  PMHx CHF with EF 30%, CAD, CKD stage III, COPD, HTN, post CABG in 2019 on dual antiplatelet therapy, NSTEMI in November 2020 who presents to the ED today with complaint of shortness of breath.  Per chart review she was admitted in January of this year for symptomatic anemia secondary to GI bleed.  Patient states that she started having shortness of breath this morning, she denies doing anything abnormal from her normal routine this morning.  She is unsure if she took her medications this morning, she states that her husband normally gives her her medications.  Patient is not the best historian.  Patient states that her legs have been swelling more than normal.  She denies any fevers, cough, chest pain, abdominal pain, back pain, nausea, vomiting, dysuria, lightheadedness, dizziness, near syncope, weakness, paresthesias.  She states that she has not any pain anywhere.  She lives at home with her husband.  Husband is not present at this moment.  She states that she has not received her Covid vaccines.  She states that she has not been around anyone else sick.  She sees Dr.Koneswaran for cardiology.   HPI     Past Medical History:  Diagnosis Date  . Acute on chronic systolic and diastolic heart failure, NYHA class 1 (HCC) 11/21/2018  . Anxiety   . CAD (coronary artery disease)    a. s/p CABG x5 in 2000 b. cath in 02/2017 showing 2/5 patent grafts with patent LIMA-LAD, patent SVG-distal RCA, occluded SVG-D1 and occluded seq-SVG-OM1-OM2 with medical management recommended and possible PCI of distal RCA branches in the future if recurrent anginal symptoms  . CHF (congestive heart failure) (HCC)    a. EF 20-25% by echo in 12/2016 b. EF 45-50% by echo in 05/2017 c. EF 30% by echo in 11/2018  . Chronic bronchitis (HCC)     . CKD (chronic kidney disease) stage 3, GFR 30-59 ml/min 11/21/2018  . COPD (chronic obstructive pulmonary disease) (HCC)   . Dizziness   . Esophageal reflux   . Essential hypertension   . Fatigue   . HLD (hyperlipidemia) 11/21/2018  . HTN (hypertension) 11/21/2018  . Hypercholesteremia   . Hyperkalemia   . Ovarian failure   . SOB (shortness of breath)     Patient Active Problem List   Diagnosis Date Noted  . Acute gastritis with hemorrhage   . Acute gastric ulcer with hemorrhage   . Acute upper GI bleed 01/19/2019  . Symptomatic anemia   . ST segment depression   . Elevated troponin   . Fracture, [place], left, closed   . Fracture   . GI bleed 01/18/2019  . Acute on chronic systolic and diastolic heart failure, NYHA class 1 (HCC) 11/21/2018  . CKD (chronic kidney disease) stage 3, GFR 30-59 ml/min 11/21/2018  . CAD in native artery 11/21/2018  . HTN (hypertension) 11/21/2018  . HLD (hyperlipidemia) 11/21/2018  . SEMI (subendocardial myocardial infarction) (HCC) 11/19/2018  . Hx of CABG 02/18/2017  . Diastolic dysfunction 02/18/2017  . Pulmonary hypertension (HCC) 02/18/2017  . Smoker 02/18/2017  . Renal insufficiency 02/18/2017  . Dyslipidemia 02/18/2017  . Ischemic cardiomyopathy     Past Surgical History:  Procedure Laterality Date  . ABDOMINAL HYSTERECTOMY    . APPENDECTOMY    . BIOPSY  01/22/2019   Procedure: BIOPSY;  Surgeon: Beverley Fiedler, MD;  Location: [facility] ENDOSCOPY;  Service: Gastroenterology;;  . BREAST BIOPSY Right   . CARDIAC CATHETERIZATION  2000  . CORONARY ARTERY BYPASS GRAFT  2000   CABG X5  . ESOPHAGOGASTRODUODENOSCOPY (EGD) WITH PROPOFOL N/A 01/22/2019   Procedure: ESOPHAGOGASTRODUODENOSCOPY (EGD) WITH PROPOFOL;  Surgeon: Beverley Fiedler, MD;  Location: [facility] ENDOSCOPY;  Service: Gastroenterology;  Laterality: N/A;  . LEFT HEART CATH AND CORS/GRAFTS ANGIOGRAPHY N/A 02/17/2017   Procedure: LEFT HEART CATH AND CORS/GRAFTS ANGIOGRAPHY;  Surgeon: Kathleene Hazel, MD;  Location: [facility] INVASIVE CV LAB;  Service: Cardiovascular;  Laterality: N/A;  . LEFT HEART CATH AND CORS/GRAFTS ANGIOGRAPHY N/A 11/20/2018   Procedure: LEFT HEART CATH AND CORS/GRAFTS ANGIOGRAPHY;  Surgeon: Yvonne Kendall, MD;  Location: [facility] INVASIVE CV LAB;  Service: Cardiovascular;  Laterality: N/A;     OB History   No obstetric history on file.     Family History  Problem Relation Age of Onset  . Heart attack Mother   . Hypertension Father     Social History   Tobacco Use  . Smoking status: Current Every Day Smoker    Packs/day: 1.00    Years: 50.00    Pack years: 50.00    Types: Cigarettes    Last attempt to quit: 02/22/2017    Years since quitting: 2.1  . Smokeless tobacco: Never Used  . Tobacco comment: 4-5 ciggs per day   Substance Use Topics  . Alcohol use: No  . Drug use: No    Home Medications Prior to Admission medications   Medication Sig Start Date End Date Taking? Authorizing Provider  aspirin EC 81 MG tablet Take 81 mg by mouth daily.    [provider]  ferrous sulfate 325 (65 FE) MG tablet Take 1 tablet (325 mg total) by mouth 2 (two) times daily with a meal. 01/24/19   Arnetha Courser, MD  furosemide (LASIX) 40 MG tablet Take 1 tablet (40 mg total) by mouth daily. (may take extra as needed for weight gain of 3 pounds in 1 day or 5 pounds in 1 week) 12/11/18   Laqueta Linden, MD  hydrALAZINE (APRESOLINE) 25 MG tablet Take 1 tablet (25 mg total) by mouth 3 (three) times daily. 12/11/18   Laqueta Linden, MD  isosorbide mononitrate (IMDUR) 30 MG 24 hr tablet Take 0.5 tablets (15 mg total) by mouth daily. Patient not taking: Reported on 03/15/2019 11/22/18   Leone Brand, NP  metoprolol succinate (TOPROL-XL) 25 MG 24 hr tablet Take 0.5 tablets (12.5 mg total) by mouth daily. 11/22/18   Leone Brand, NP  Multiple Vitamin (MULTIVITAMIN) tablet Take 1 tablet by mouth daily.    [provider]  nitroGLYCERIN  (NITROSTAT) 0.4 MG SL tablet Place 1 tablet (0.4 mg total) under the tongue every 5 (five) minutes x 3 doses as needed for chest pain. 02/18/17   Abelino Derrick, PA-C  potassium chloride SA (KLOR-CON M20) 20 MEQ tablet Take 1 tablet (20 mEq total) by mouth daily. (may take one extra tab when taking extra Lasix) 12/11/18   Laqueta Linden, MD  rosuvastatin (CRESTOR) 5 MG tablet Take 5 mg by mouth daily. 12/22/18   [provider]  vitamin B-12 (CYANOCOBALAMIN) 250 MCG tablet Take 250 mcg by mouth daily.    [provider]    Allergies    Patient has no known allergies.  Review of Systems   Review of Systems  Respiratory: Positive for shortness of breath.    Ten systems are reviewed by me and are negative for acute changes, except as noted in the HPI. Physical Exam Updated Vital Signs There were no vitals taken for this visit.  Physical Exam PE: Constitutional: Patient in respiratory distress with increased work of breathing.  Tachypnea present.  No hypoxia or cyanosis. Airway is patent.  HENT: normocephalic, atraumatic Cardiovascular: normal rate and rhythm, distal pulses intact Pulmonary/Chest: Increased effort, patient speaking in short sentences, patient has pulmonary rales in bilateral lower posterior fields.  No wheezing or stridor.   Abdominal: NBS, soft and nontender, No abdominal distention Musculoskeletal: full ROM, pitting edema 3+ on feet up to knees bilaterally.   Lymphadenopathy: no cervical adenopathy Neurological: alert with goal directed thinking Skin: warm and dry, no diaphoresis, JVD present  Psychiatric: normal mood and affect, normal behavior   ED Results / Procedures / Treatments   Labs (all labs ordered are listed, but only abnormal results are displayed) Labs Reviewed - No data to display  EKG None  Radiology No results found.  Procedures .Critical Care Performed by: Alfredia Client, PA-C Authorized by: Alfredia Client, PA-C    Critical care provider statement:    Critical care time (minutes):  45   Critical care was necessary to treat or prevent imminent or life-threatening deterioration of the following conditions:  Respiratory failure   Critical care was time spent personally by me on the following activities:  Discussions with consultants, evaluation of patient's response to treatment, examination of patient, ordering and performing treatments and interventions, ordering and review of laboratory studies, ordering and review of radiographic studies, pulse oximetry, re-evaluation of patient's condition, obtaining history from patient or surrogate and review of old charts   (including critical care time)  Medications Ordered in ED Medications - No data to display  ED Course  I have reviewed the triage vital signs and the nursing notes.  Pertinent labs & imaging results that were available during my care of the patient were reviewed by me and considered in my medical decision making (see chart for details).    MDM Rules/Calculators/A&P                     Susan [name] is a 74 y.o. female  PMHx CHF with EF 30%, CAD, CKD stage III, COPD, HTN, post CABG in 2019 on dual antiplatelet therapy, NSTEMI in November 2020 who presents to the ED today with complaint of shortness of breath.  Physical exam consistent with CHF exacerbation.  Chest x-ray, troponin, CBC, CMP, BNP, EKG ordered.  Patient will most likely need admission.  IV Lasix and Nitropaste ordered.   Patient does not seem altered, Unsure medical compliance.  Discussed case with my attending physician who is agreeable to plan to consult for admission. 7:08 PM discussed case with hospitalist who agrees to accept care of patient.  The patient appears reasonably stabilized for admission considering the current resources, flow, and capabilities available in the ED at this time, and I doubt any other [facility] requiring further screening and/or treatment in the ED prior  to admission.  I discussed this case with my attending physician, Dr. Sabra Heck, who cosigned this note including patient's presenting symptoms, physical exam, and planned diagnostics and interventions. Attending physician stated agreement with plan or made changes to plan which were implemented.   Attending physician assessed patient at bedside.  Final Clinical Impression(s) / ED Diagnoses Final diagnoses:  None    Rx /  DC Orders ED Discharge Orders    None       Farrel Gordon, PA-C 05/03/19 1140    Eber Hong, MD 05/05/19 [phone]

## 2019-05-02 NOTE — ED Triage Notes (Signed)
Pt presents to ED with complaints of increased SOB started today. Pt denies fever or cough.

## 2019-05-02 NOTE — H&P (Signed)
History and Physical    Patient Demographics:    Susan [name] MGN:[id number] DOB: 20-Feb-1945 DOA: 05/02/2019  PCP: [name], MD  Patient coming from: Home  I have personally briefly reviewed patient's old medical records in [facility]  Chief Complaint: Shortness of breath   Assessment & Plan:     Assessment/Plan Active Problems:   Ischemic cardiomyopathy   Hx of CABG   Pulmonary hypertension (HCC)   Dyslipidemia   Acute on chronic systolic and diastolic heart failure, NYHA class 1 (HCC)   CKD (chronic kidney disease) stage 3, GFR 30-59 ml/min   HTN (hypertension)   Elevated troponin     Principal Problem: Acute on chronic congestive heart failure with reduced ejection fraction Patient has a history of ischemic cardiomyopathy.  Most recent echocardiogram in January 2021 showed an EF of 25 to 30% with severely reduced LV systolic function. Has been having increased dyspnea on exertion, orthopnea, bilateral lower extremity swelling over the last 2 to 3 days.  BNP is greater than 4500 at presentation.  Troponin is mildly elevated.  Presentation is consistent with CHF exacerbation. -We will place on IV Lasix -Monitor input output, renal function, daily weights -Continue aspirin, statins, beta-blockers  Other Active Problems: Elevated troponin: Likely demand ischemia in the setting of CHF exacerbation.  Troponin is at 49.  Review of records shows it was 195 on discharge in January 2021. -Telemetry monitoring, serial troponins  Coronary artery disease s/p CABG s/p CABG x5 in 2000 with cath in 11/2018 showing 2/5 patent grafts with patent LIMA-LAD, patent SVG-distal RCA, occluded SVG-D1 and occluded seq-SVG-OM1-OM2 with medical management recommended -Continue aspirin, Plavix  Hypertension -Continue metoprolol, hydralazine  Hyperlipidemia -Continue Crestor  Chronic iron deficiency anemia -Continue ferrous sulfate  Hyponatremia Sodium 1.8 on presentation.   Likely hypervolemic hyponatremia -We will place on diuretics and monitor serum sodium closely  DVT prophylaxis: Heparin subcu Code Status:  Full code Family Communication: N/A  Disposition Plan: Admitted as inpatient for CHF exacerbation, expect 3-day inpatient stay, may need PT eval prior to discharge Consults called: N/A Admission status: Inpatient status    HPI:     HPI: Susan [name] is a 74 y.o. female with medical history significant of CAD (s/p CABG x5 in 2000 with cath in 11/2018 showing 2/5 patent grafts with patent LIMA-LAD, patent SVG-distal RCA, occluded SVG-D1 and occluded seq-SVG-OM1-OM2 with medical management recommended, repeat cath in 11/2018 showing stable anatomy with patent LIMA-LAD and patent SVG-dRCA with 50% mid-graft stenosis and medical therapy recommended), chronic combined systolic and diastolic (EF 20-25% by echo in 12/2016, at 45-50% by echo in 05/2017, 30% by repeat echo in 11/2018), HTN, HLD, COPD and continued tobacco use  who presented to the ER with worsening shortness of breath.  Patient has been having worsening dyspnea on exertion over the last 2 to 3 days.  Also reports increased bilateral lower extremity swelling.  Has had orthopnea as well as PND.  No fever, chills, cough, palpitations, nausea, vomiting, diarrhea, abdominal pain, hematemesis, melena, hematochezia.  Has been on Lasix at home. ED Course:  Vital Signs reviewed on presentation, significant for temperature 98.4, heart rate 71, blood pressure 145/93, saturation 95% on room air. Labs reviewed, significant for sodium 128, potassium 3.9, chloride 92, BUN 13, creatinine 1.1, albumin 3.1, LFTs within normal limits, BNP more than 4500, troponin 49, WBC count 6.5, hemoglobin 14.3, hematocrit 43, platelets 250. Imaging personally Reviewed, chest x-ray shows central pulmonary vascular congestion. EKG personally reviewed, shows sinus tachycardia,  PVCs, LVH    Review of systems:    Review of Systems:  As per HPI otherwise 10 point review of systems negative.  All other review of systems is negative except the ones noted above in the HPI.    Past Medical and Surgical History:  Reviewed by me  Past Medical History:  Diagnosis Date  . Acute on chronic systolic and diastolic heart failure, NYHA class 1 (HCC) 11/21/2018  . Anxiety   . CAD (coronary artery disease)    a. s/p CABG x5 in 2000 b. cath in 02/2017 showing 2/5 patent grafts with patent LIMA-LAD, patent SVG-distal RCA, occluded SVG-D1 and occluded seq-SVG-OM1-OM2 with medical management recommended and possible PCI of distal RCA branches in the future if recurrent anginal symptoms  . CHF (congestive heart failure) (HCC)    a. EF 20-25% by echo in 12/2016 b. EF 45-50% by echo in 05/2017 c. EF 30% by echo in 11/2018  . Chronic bronchitis (HCC)   . CKD (chronic kidney disease) stage 3, GFR 30-59 ml/min 11/21/2018  . COPD (chronic obstructive pulmonary disease) (HCC)   . Dizziness   . Esophageal reflux   . Essential hypertension   . Fatigue   . HLD (hyperlipidemia) 11/21/2018  . HTN (hypertension) 11/21/2018  . Hypercholesteremia   . Hyperkalemia   . Ovarian failure   . SOB (shortness of breath)     Past Surgical History:  Procedure Laterality Date  . ABDOMINAL HYSTERECTOMY    . APPENDECTOMY    . BIOPSY  01/22/2019   Procedure: BIOPSY;  Surgeon: Beverley Fiedler, MD;  Location: [facility] ENDOSCOPY;  Service: Gastroenterology;;  . BREAST BIOPSY Right   . CARDIAC CATHETERIZATION  2000  . CORONARY ARTERY BYPASS GRAFT  2000   CABG X5  . ESOPHAGOGASTRODUODENOSCOPY (EGD) WITH PROPOFOL N/A 01/22/2019   Procedure: ESOPHAGOGASTRODUODENOSCOPY (EGD) WITH PROPOFOL;  Surgeon: Beverley Fiedler, MD;  Location: [facility] ENDOSCOPY;  Service: Gastroenterology;  Laterality: N/A;  . LEFT HEART CATH AND CORS/GRAFTS ANGIOGRAPHY N/A 02/17/2017   Procedure: LEFT HEART CATH AND CORS/GRAFTS ANGIOGRAPHY;  Surgeon: Kathleene Hazel, MD;  Location: [facility] INVASIVE CV  LAB;  Service: Cardiovascular;  Laterality: N/A;  . LEFT HEART CATH AND CORS/GRAFTS ANGIOGRAPHY N/A 11/20/2018   Procedure: LEFT HEART CATH AND CORS/GRAFTS ANGIOGRAPHY;  Surgeon: Yvonne Kendall, MD;  Location: [facility] INVASIVE CV LAB;  Service: Cardiovascular;  Laterality: N/A;     Social History:  Reviewed by me   reports that she has been smoking cigarettes. She has a 50.00 pack-year smoking history. She has never used smokeless tobacco. She reports that she does not drink alcohol or use drugs.  Allergies:    No Known Allergies  Family History :   Family History  Problem Relation Age of Onset  . Heart attack Mother   . Hypertension Father    Family history reviewed, noted as above, not pertinent to current presentation.   Home Medications:    Prior to Admission medications   Medication Sig Start Date End Date Taking? Authorizing Provider  albuterol (VENTOLIN HFA) 108 (90 Base) MCG/ACT inhaler Inhale 1-2 puffs into the lungs every 6 (six) hours as needed for wheezing or shortness of breath.   Yes [provider]  aspirin EC 81 MG tablet Take 81 mg by mouth daily.   Yes [provider]  clopidogrel (PLAVIX) 75 MG tablet Take 75 mg by mouth daily.   Yes [provider]  ferrous sulfate 325 (65 FE) MG tablet Take 1 tablet (  325 mg total) by mouth 2 (two) times daily with a meal. 01/24/19  Yes Arnetha Courser, MD  furosemide (LASIX) 40 MG tablet Take 1 tablet (40 mg total) by mouth daily. (may take extra as needed for weight gain of 3 pounds in 1 day or 5 pounds in 1 week) 12/11/18  Yes Laqueta Linden, MD  hydrALAZINE (APRESOLINE) 25 MG tablet Take 1 tablet (25 mg total) by mouth 3 (three) times daily. 12/11/18  Yes Laqueta Linden, MD  metoprolol tartrate (LOPRESSOR) 25 MG tablet Take 12.5 mg by mouth daily. 04/21/19  Yes [provider]  Multiple Vitamin (MULTIVITAMIN) tablet Take 1 tablet by mouth daily.   Yes [provider]    potassium chloride SA (KLOR-CON M20) 20 MEQ tablet Take 1 tablet (20 mEq total) by mouth daily. (may take one extra tab when taking extra Lasix) 12/11/18  Yes Laqueta Linden, MD  rosuvastatin (CRESTOR) 5 MG tablet Take 5 mg by mouth daily. 12/22/18  Yes [provider]  vitamin B-12 (CYANOCOBALAMIN) 250 MCG tablet Take 250 mcg by mouth daily.   Yes [provider]  nitroGLYCERIN (NITROSTAT) 0.4 MG SL tablet Place 1 tablet (0.4 mg total) under the tongue every 5 (five) minutes x 3 doses as needed for chest pain. 02/18/17   Abelino Derrick, PA-C    Physical Exam:    Physical Exam: Vitals:   05/02/19 1833 05/02/19 1850 05/02/19 1900 05/02/19 1930  BP:   (!) 133/97 (!) 152/100  Pulse:  96 (!) 106 98  Resp:  (!) 25 (!) 23 (!) 24  Temp:      TempSrc:      SpO2: 97% 96% 97% 95%  Weight:      Height:        Constitutional: NAD, calm, comfortable Vitals:   05/02/19 1833 05/02/19 1850 05/02/19 1900 05/02/19 1930  BP:   (!) 133/97 (!) 152/100  Pulse:  96 (!) 106 98  Resp:  (!) 25 (!) 23 (!) 24  Temp:      TempSrc:      SpO2: 97% 96% 97% 95%  Weight:      Height:       Eyes: PERRL, lids and conjunctivae normal ENMT: Mucous membranes are moist. Posterior pharynx clear of any exudate or lesions.Normal dentition.  Neck: normal, supple, no masses, no thyromegaly Respiratory: Bilateral basal crepitations.  Normal respiratory effort. No accessory muscle use.  Cardiovascular: Regular rate and rhythm, no murmurs / rubs / gallops.  Bilateral 2+ pedal edema.. 2+ pedal pulses. No carotid bruits.  Abdomen: no tenderness, no masses palpated. No hepatosplenomegaly. Bowel sounds positive.  Musculoskeletal: no clubbing / cyanosis. No joint deformity upper and lower extremities. Good ROM, no contractures. Normal muscle tone.  Skin: no rashes, lesions, ulcers. No induration Neurologic: CN 2-12 grossly intact. Sensation intact, DTR normal. Strength 5/5 in all 4.  Psychiatric: Normal  judgment and insight. Alert and oriented x 3. Normal mood.    Decubitus Ulcers: Not present on admission Catheters and tubes: None  Data Review:    Labs on Admission: I have personally reviewed following labs and imaging studies  CBC: Recent Labs  Lab 05/02/19 1841  WBC 6.5  NEUTROABS 4.7  HGB 14.3  HCT 43.4  MCV 101.2*  PLT 250   Basic Metabolic Panel: Recent Labs  Lab 05/02/19 1841  NA 128*  K 3.9  CL 92*  CO2 26  GLUCOSE 122*  BUN 13  CREATININE 1.10*  CALCIUM  8.5*   GFR: Estimated Creatinine Clearance: 28.5 mL/min (A) (by C-G formula based on SCr of 1.1 mg/dL (H)). Liver Function Tests: Recent Labs  Lab 05/02/19 1841  AST 21  ALT 19  ALKPHOS 71  BILITOT 0.5  PROT 6.4*  ALBUMIN 3.1*   No results for input(s): LIPASE, AMYLASE in the last 168 hours. No results for input(s): AMMONIA in the last 168 hours. Coagulation Profile: No results for input(s): INR, PROTIME in the last 168 hours. Cardiac Enzymes: No results for input(s): CKTOTAL, CKMB, CKMBINDEX, TROPONINI in the last 168 hours. BNP (last 3 results) No results for input(s): PROBNP in the last 8760 hours. HbA1C: No results for input(s): HGBA1C in the last 72 hours. CBG: No results for input(s): GLUCAP in the last 168 hours. Lipid Profile: No results for input(s): CHOL, HDL, LDLCALC, TRIG, CHOLHDL, LDLDIRECT in the last 72 hours. Thyroid Function Tests: No results for input(s): TSH, T4TOTAL, FREET4, T3FREE, THYROIDAB in the last 72 hours. Anemia Panel: No results for input(s): VITAMINB12, FOLATE, FERRITIN, TIBC, IRON, RETICCTPCT in the last 72 hours. Urine analysis:    Component Value Date/Time   COLORURINE YELLOW 01/18/2019 2329   APPEARANCEUR CLEAR 01/18/2019 2329   LABSPEC 1.019 01/18/2019 2329   PHURINE 5.0 01/18/2019 2329   GLUCOSEU NEGATIVE 01/18/2019 2329   HGBUR SMALL (A) 01/18/2019 2329   BILIRUBINUR NEGATIVE 01/18/2019 2329   KETONESUR NEGATIVE 01/18/2019 2329   PROTEINUR 100  (A) 01/18/2019 2329   NITRITE NEGATIVE 01/18/2019 2329   LEUKOCYTESUR NEGATIVE 01/18/2019 2329     Imaging Results:      Radiological Exams on Admission: DG Chest Portable 1 View  Result Date: 05/02/2019 CLINICAL DATA:  Short of breath, heart failure EXAM: PORTABLE CHEST 1 VIEW COMPARISON:  04/21/2019 FINDINGS: Single frontal view of the chest demonstrates persistent enlargement of the cardiac silhouette. Postsurgical changes from prior CABG. Background emphysema unchanged. There is chronic central vascular congestion without airspace disease, effusion, or pneumothorax. No acute bony abnormality. IMPRESSION: 1. Stable enlarged cardiac silhouette. 2. Chronic central vascular congestion, no acute process. 3. Emphysema. Electronically Signed   By: Randa Ngo M.D.   On: 05/02/2019 19:10      Joandy Burget Ginette Otto MD [facility]  If 7PM-7AM, please contact night-coverage   05/02/2019, 7:56 PM

## 2019-05-02 NOTE — Discharge Instructions (Addendum)
1)Very low-salt diet advised 2)Weigh yourself daily, call if you gain more than 3 pounds in 1 day or more than 5 pounds in 1 week as your diuretic medications may need to be adjusted 3)Limit your Fluid  intake to no more than 60 ounces (1.8 Liters) per day 4) you are taking aspirin and Plavix which are blood thinners so Avoid ibuprofen/Advil/Aleve/Motrin/Goody Powders/Naproxen/BC powders/Meloxicam/Diclofenac/Indomethacin and other Nonsteroidal anti-inflammatory medications as these will make you more likely to bleed and can cause stomach ulcers, can also cause Kidney problems.  5) please wear your TED stockings during the day, may take them off at bedtime 6) follow-up with your primary care physician in about 10 to 14 days for recheck and reevaluation

## 2019-05-02 NOTE — Progress Notes (Deleted)
Cardiology Office Note  Date: 05/02/2019   ID: Susan [name], DOB [birth year], MRN [id number]  PCP:  [name], MD  Cardiologist:  Prentice Docker, MD Electrophysiologist:  None   Chief Complaint: F/U CAD, Acute on chronic combined HF, CKD 3, HTN, HLD, SOB.    History of Present Illness: Susan [name] is a 74 y.o. female with a history of   CAD s/p CABG x 5, Acute on chronic combined HF, CKD 3, HTN, HLD, SOB, IDA.   She presented to the emergency room [facility] respiratory distress increasing edema, JVD and pulmonary Rales,  3+ symmetrical pitting edema of the legs. She was admitted      Past Medical History:  Diagnosis Date  . Acute on chronic systolic and diastolic heart failure, NYHA class 1 (HCC) 11/21/2018  . Anxiety   . CAD (coronary artery disease)    a. s/p CABG x5 in 2000 b. cath in 02/2017 showing 2/5 patent grafts with patent LIMA-LAD, patent SVG-distal RCA, occluded SVG-D1 and occluded seq-SVG-OM1-OM2 with medical management recommended and possible PCI of distal RCA branches in the future if recurrent anginal symptoms  . CHF (congestive heart failure) (HCC)    a. EF 20-25% by echo in 12/2016 b. EF 45-50% by echo in 05/2017 c. EF 30% by echo in 11/2018  . Chronic bronchitis (HCC)   . CKD (chronic kidney disease) stage 3, GFR 30-59 ml/min 11/21/2018  . COPD (chronic obstructive pulmonary disease) (HCC)   . Dizziness   . Esophageal reflux   . Essential hypertension   . Fatigue   . HLD (hyperlipidemia) 11/21/2018  . HTN (hypertension) 11/21/2018  . Hypercholesteremia   . Hyperkalemia   . Ovarian failure   . SOB (shortness of breath)     Past Surgical History:  Procedure Laterality Date  . ABDOMINAL HYSTERECTOMY    . APPENDECTOMY    . BIOPSY  01/22/2019   Procedure: BIOPSY;  Surgeon: Beverley Fiedler, MD;  Location: [facility] ENDOSCOPY;  Service: Gastroenterology;;  . BREAST BIOPSY Right   . CARDIAC CATHETERIZATION  2000  . CORONARY ARTERY BYPASS GRAFT   2000   CABG X5  . ESOPHAGOGASTRODUODENOSCOPY (EGD) WITH PROPOFOL N/A 01/22/2019   Procedure: ESOPHAGOGASTRODUODENOSCOPY (EGD) WITH PROPOFOL;  Surgeon: Beverley Fiedler, MD;  Location: [facility] ENDOSCOPY;  Service: Gastroenterology;  Laterality: N/A;  . LEFT HEART CATH AND CORS/GRAFTS ANGIOGRAPHY N/A 02/17/2017   Procedure: LEFT HEART CATH AND CORS/GRAFTS ANGIOGRAPHY;  Surgeon: Kathleene Hazel, MD;  Location: [facility] INVASIVE CV LAB;  Service: Cardiovascular;  Laterality: N/A;  . LEFT HEART CATH AND CORS/GRAFTS ANGIOGRAPHY N/A 11/20/2018   Procedure: LEFT HEART CATH AND CORS/GRAFTS ANGIOGRAPHY;  Surgeon: Yvonne Kendall, MD;  Location: [facility] INVASIVE CV LAB;  Service: Cardiovascular;  Laterality: N/A;    No current facility-administered medications for this visit.   Current Outpatient Medications  Medication Sig Dispense Refill  . albuterol (VENTOLIN HFA) 108 (90 Base) MCG/ACT inhaler Inhale 1-2 puffs into the lungs every 6 (six) hours as needed for wheezing or shortness of breath.    Marland Kitchen aspirin EC 81 MG tablet Take 81 mg by mouth daily.    . clopidogrel (PLAVIX) 75 MG tablet Take 75 mg by mouth daily.    . ferrous sulfate 325 (65 FE) MG tablet Take 1 tablet (325 mg total) by mouth 2 (two) times daily with a meal. 90 tablet 3  . furosemide (LASIX) 40 MG tablet Take 1 tablet (40 mg total) by mouth daily. (may take extra as  needed for weight gain of 3 pounds in 1 day or 5 pounds in 1 week) 100 tablet 3  . hydrALAZINE (APRESOLINE) 25 MG tablet Take 1 tablet (25 mg total) by mouth 3 (three) times daily. 270 tablet 3  . metoprolol tartrate (LOPRESSOR) 25 MG tablet Take 12.5 mg by mouth daily.    . Multiple Vitamin (MULTIVITAMIN) tablet Take 1 tablet by mouth daily.    . nitroGLYCERIN (NITROSTAT) 0.4 MG SL tablet Place 1 tablet (0.4 mg total) under the tongue every 5 (five) minutes x 3 doses as needed for chest pain. 25 tablet 3  . potassium chloride SA (KLOR-CON M20) 20 MEQ tablet Take 1 tablet (20 mEq total) by  mouth daily. (may take one extra tab when taking extra Lasix) 100 tablet 3  . rosuvastatin (CRESTOR) 5 MG tablet Take 5 mg by mouth daily.    . vitamin B-12 (CYANOCOBALAMIN) 250 MCG tablet Take 250 mcg by mouth daily.     Facility-Administered Medications Ordered in Other Visits  Medication Dose Route Frequency Provider Last Rate Last Admin  . nitroGLYCERIN (NITROGLYN) 2 % ointment 0.5 inch  0.5 inch Topical Q6H Alfredia Client, PA-C   0.5 inch at 05/02/19 1920   Allergies:  Patient has no known allergies.   Social History: The patient  reports that she has been smoking cigarettes. She has a 50.00 pack-year smoking history. She has never used smokeless tobacco. She reports that she does not drink alcohol or use drugs.   Family History: The patient's family history includes Heart attack in her mother; Hypertension in her father.   ROS:  Please see the history of present illness. Otherwise, complete review of systems is positive for {NONE DEFAULTED:18576::"none"}.  All other systems are reviewed and negative.   Physical Exam: VS:  There were no vitals taken for this visit., BMI There is no height or weight on file to calculate BMI.  Wt Readings from Last 3 Encounters:  05/02/19 90 lb (40.8 kg)  03/15/19 76 lb (34.5 kg)  03/01/19 83 lb (37.6 kg)    General: Patient appears comfortable at rest. HEENT: Conjunctiva and lids normal, oropharynx clear with moist mucosa. Neck: Supple, no elevated JVP or carotid bruits, no thyromegaly. Lungs: Clear to auscultation, nonlabored breathing at rest. Cardiac: Regular rate and rhythm, no S3 or significant systolic murmur, no pericardial rub. Abdomen: Soft, nontender, no hepatomegaly, bowel sounds present, no guarding or rebound. Extremities: No pitting edema, distal pulses 2+. Skin: Warm and dry. Musculoskeletal: No kyphosis. Neuropsychiatric: Alert and oriented x3, affect grossly appropriate.  ECG:  {EKG/Telemetry Strips  [phone]}  Recent Labwork: 01/24/2019: Magnesium 1.9 05/02/2019: ALT 19; AST 21; B Natriuretic Peptide >4,500.0; BUN 13; Creatinine, Ser 1.10; Hemoglobin 14.3; Platelets 250; Potassium 3.9; Sodium 128     Component Value Date/Time   CHOL 198 11/20/2018 0516   TRIG 114 11/20/2018 0516   HDL 77 11/20/2018 0516   CHOLHDL 2.6 11/20/2018 0516   VLDL 23 11/20/2018 0516   LDLCALC 98 11/20/2018 0516    Other Studies Reviewed Today:   Assessment and Plan:  No diagnosis found.   Medication Adjustments/Labs and Tests Ordered: Current medicines are reviewed at length with the patient today.  Concerns regarding medicines are outlined above.   Disposition: Follow-up with ***  Signed, Levell July, NP 05/02/2019 9:49 PM    [facility], [place], [place] [address] Phone: [phone]; Fax: [phone]

## 2019-05-03 ENCOUNTER — Ambulatory Visit: Payer: Medicare PPO | Admitting: Family Medicine

## 2019-05-03 DIAGNOSIS — I10 Essential (primary) hypertension: Secondary | ICD-10-CM

## 2019-05-03 DIAGNOSIS — R778 Other specified abnormalities of plasma proteins: Secondary | ICD-10-CM

## 2019-05-03 DIAGNOSIS — E785 Hyperlipidemia, unspecified: Secondary | ICD-10-CM | POA: Diagnosis not present

## 2019-05-03 DIAGNOSIS — N1831 Chronic kidney disease, stage 3a: Secondary | ICD-10-CM | POA: Diagnosis not present

## 2019-05-03 DIAGNOSIS — J810 Acute pulmonary edema: Secondary | ICD-10-CM | POA: Diagnosis not present

## 2019-05-03 DIAGNOSIS — I5023 Acute on chronic systolic (congestive) heart failure: Secondary | ICD-10-CM | POA: Diagnosis not present

## 2019-05-03 DIAGNOSIS — I509 Heart failure, unspecified: Secondary | ICD-10-CM

## 2019-05-03 LAB — CBC
HCT: 42.2 % (ref 36.0–46.0)
Hemoglobin: 13.7 g/dL (ref 12.0–15.0)
MCH: 33.7 pg (ref 26.0–34.0)
MCHC: 32.5 g/dL (ref 30.0–36.0)
MCV: 103.7 fL — ABNORMAL HIGH (ref 80.0–100.0)
Platelets: 236 10*3/uL (ref 150–400)
RBC: 4.07 MIL/uL (ref 3.87–5.11)
RDW: 14.9 % (ref 11.5–15.5)
WBC: 5.6 10*3/uL (ref 4.0–10.5)
nRBC: 0 % (ref 0.0–0.2)

## 2019-05-03 LAB — BASIC METABOLIC PANEL
Anion gap: 12 (ref 5–15)
BUN: 14 mg/dL (ref 8–23)
CO2: 30 mmol/L (ref 22–32)
Calcium: 8.6 mg/dL — ABNORMAL LOW (ref 8.9–10.3)
Chloride: 89 mmol/L — ABNORMAL LOW (ref 98–111)
Creatinine, Ser: 1.11 mg/dL — ABNORMAL HIGH (ref 0.44–1.00)
GFR calc Af Amer: 56 mL/min — ABNORMAL LOW (ref >60)
GFR calc non Af Amer: 49 mL/min — ABNORMAL LOW (ref >60)
Glucose, Bld: 104 mg/dL — ABNORMAL HIGH (ref 70–99)
Potassium: 4 mmol/L (ref 3.5–5.1)
Sodium: 131 mmol/L — ABNORMAL LOW (ref 135–145)

## 2019-05-03 MED ORDER — POTASSIUM CHLORIDE CRYS ER 20 MEQ PO TBCR
20.0000 meq | EXTENDED_RELEASE_TABLET | Freq: Every day | ORAL | Status: DC
  Administered 2019-05-03 – 2019-05-04 (×2): 20 meq via ORAL
  Filled 2019-05-03 (×2): qty 1

## 2019-05-03 MED ORDER — FERROUS SULFATE 325 (65 FE) MG PO TABS
325.0000 mg | ORAL_TABLET | Freq: Two times a day (BID) | ORAL | Status: DC
  Administered 2019-05-03 – 2019-05-04 (×3): 325 mg via ORAL
  Filled 2019-05-03 (×3): qty 1

## 2019-05-03 MED ORDER — ONDANSETRON HCL 4 MG/2ML IJ SOLN: 4.0000 mg | Freq: Four times a day (QID) | INTRAMUSCULAR | Status: DC | PRN

## 2019-05-03 MED ORDER — ALBUTEROL SULFATE HFA 108 (90 BASE) MCG/ACT IN AERS: 1.0000 | INHALATION_SPRAY | Freq: Four times a day (QID) | RESPIRATORY_TRACT | Status: DC | PRN

## 2019-05-03 MED ORDER — NITROGLYCERIN 0.4 MG SL SUBL: 0.4000 mg | SUBLINGUAL_TABLET | SUBLINGUAL | Status: DC | PRN

## 2019-05-03 MED ORDER — VITAMIN B-12 100 MCG PO TABS
250.0000 ug | ORAL_TABLET | Freq: Every day | ORAL | Status: DC
  Administered 2019-05-03 – 2019-05-04 (×2): 250 ug via ORAL
  Filled 2019-05-03 (×2): qty 3

## 2019-05-03 MED ORDER — SODIUM CHLORIDE 0.9 % IV SOLN: 250.0000 mL | INTRAVENOUS | Status: DC | PRN

## 2019-05-03 MED ORDER — BISACODYL 10 MG RE SUPP: 10.0000 mg | Freq: Every day | RECTAL | Status: DC | PRN

## 2019-05-03 MED ORDER — CARVEDILOL 3.125 MG PO TABS
3.1250 mg | ORAL_TABLET | Freq: Two times a day (BID) | ORAL | Status: DC
  Administered 2019-05-04: 3.125 mg via ORAL
  Filled 2019-05-03: qty 1

## 2019-05-03 MED ORDER — POLYETHYLENE GLYCOL 3350 17 G PO PACK: 17.0000 g | PACK | Freq: Every day | ORAL | Status: DC | PRN

## 2019-05-03 MED ORDER — ONDANSETRON HCL 4 MG PO TABS: 4.0000 mg | ORAL_TABLET | Freq: Four times a day (QID) | ORAL | Status: DC | PRN

## 2019-05-03 MED ORDER — ADULT MULTIVITAMIN W/MINERALS CH
1.0000 | ORAL_TABLET | Freq: Every day | ORAL | Status: DC
  Administered 2019-05-03 – 2019-05-04 (×2): 1 via ORAL
  Filled 2019-05-03 (×2): qty 1

## 2019-05-03 MED ORDER — ASPIRIN EC 81 MG PO TBEC
81.0000 mg | DELAYED_RELEASE_TABLET | Freq: Every day | ORAL | Status: DC
  Administered 2019-05-03 – 2019-05-04 (×2): 81 mg via ORAL
  Filled 2019-05-03 (×2): qty 1

## 2019-05-03 MED ORDER — FUROSEMIDE 10 MG/ML IJ SOLN
20.0000 mg | Freq: Two times a day (BID) | INTRAMUSCULAR | Status: DC
  Administered 2019-05-04: 20 mg via INTRAVENOUS
  Filled 2019-05-03 (×2): qty 2

## 2019-05-03 MED ORDER — FUROSEMIDE 10 MG/ML IJ SOLN
40.0000 mg | Freq: Two times a day (BID) | INTRAMUSCULAR | Status: DC
  Administered 2019-05-03 (×2): 40 mg via INTRAVENOUS
  Filled 2019-05-03 (×2): qty 4

## 2019-05-03 MED ORDER — METOPROLOL TARTRATE 25 MG PO TABS
12.5000 mg | ORAL_TABLET | Freq: Every day | ORAL | Status: DC
  Administered 2019-05-03: 12.5 mg via ORAL
  Filled 2019-05-03: qty 1

## 2019-05-03 MED ORDER — HYDRALAZINE HCL 25 MG PO TABS
25.0000 mg | ORAL_TABLET | Freq: Two times a day (BID) | ORAL | Status: DC
  Administered 2019-05-04: 09:00:00 25 mg via ORAL
  Filled 2019-05-03 (×2): qty 1

## 2019-05-03 MED ORDER — SODIUM CHLORIDE 0.9% FLUSH: 3.0000 mL | INTRAVENOUS | Status: DC | PRN

## 2019-05-03 MED ORDER — ISOSORBIDE DINITRATE 5 MG PO TABS
5.0000 mg | ORAL_TABLET | Freq: Two times a day (BID) | ORAL | Status: DC
  Administered 2019-05-03 – 2019-05-04 (×2): 5 mg via ORAL
  Filled 2019-05-03 (×8): qty 1

## 2019-05-03 MED ORDER — SODIUM CHLORIDE 0.9% FLUSH
3.0000 mL | Freq: Two times a day (BID) | INTRAVENOUS | Status: DC
  Administered 2019-05-03 – 2019-05-04 (×4): 3 mL via INTRAVENOUS

## 2019-05-03 MED ORDER — ROSUVASTATIN CALCIUM 10 MG PO TABS
5.0000 mg | ORAL_TABLET | Freq: Every day | ORAL | Status: DC
  Administered 2019-05-03 – 2019-05-04 (×2): 5 mg via ORAL
  Filled 2019-05-03 (×2): qty 1

## 2019-05-03 MED ORDER — ISOSORBIDE DINITRATE 10 MG PO TABS
ORAL_TABLET | ORAL | Status: AC
  Filled 2019-05-03: qty 1

## 2019-05-03 MED ORDER — ACETAMINOPHEN 325 MG PO TABS
650.0000 mg | ORAL_TABLET | Freq: Four times a day (QID) | ORAL | Status: DC | PRN
  Administered 2019-05-03 (×2): 650 mg via ORAL
  Filled 2019-05-03 (×2): qty 2

## 2019-05-03 MED ORDER — HYDRALAZINE HCL 25 MG PO TABS
25.0000 mg | ORAL_TABLET | Freq: Three times a day (TID) | ORAL | Status: DC
  Administered 2019-05-03: 25 mg via ORAL
  Filled 2019-05-03: qty 1

## 2019-05-03 MED ORDER — HEPARIN SODIUM (PORCINE) 5000 UNIT/ML IJ SOLN
5000.0000 [IU] | Freq: Three times a day (TID) | INTRAMUSCULAR | Status: DC
  Administered 2019-05-03 – 2019-05-04 (×5): 5000 [IU] via SUBCUTANEOUS
  Filled 2019-05-03 (×5): qty 1

## 2019-05-03 MED ORDER — CLOPIDOGREL BISULFATE 75 MG PO TABS
75.0000 mg | ORAL_TABLET | Freq: Every day | ORAL | Status: DC
  Administered 2019-05-03 – 2019-05-04 (×2): 75 mg via ORAL
  Filled 2019-05-03 (×2): qty 1

## 2019-05-03 NOTE — Progress Notes (Signed)
Patient Demographics:    Susan [name], is a 74 y.o. female, DOB - Nov 27, 1945, FIE:[id number]  Admit date - 05/02/2019   Admitting Physician Olga Coaster, MD  Outpatient Primary MD for the patient is [name], MD  LOS - 1   Chief Complaint  Patient presents with  . Shortness of Breath        Subjective:    [name] today has no fevers, no emesis,  No chest pain,  -Husband at bedside, -Dyspnea with minimal exertion persist -Apparently had some orthopnea Voiding okay -Significant lower extremity edema noted   Assessment  & Plan :    Active Problems:   Ischemic cardiomyopathy   Hx of CABG   Pulmonary hypertension (HCC)   Dyslipidemia   Acute on chronic systolic and diastolic heart failure, NYHA class 1 (HCC)   CKD (chronic kidney disease) stage 3, GFR 30-59 ml/min   HTN (hypertension)   Elevated troponin   CHF exacerbation (HCC)  Brief Summary:- 74 y.o. female with medical history significant of CAD (s/p CABG x5 in 2000 with cath in 11/2018 showing 2/5 patent grafts with patent LIMA-LAD, patent SVG-distal RCA, occluded SVG-D1 and occluded seq-SVG-OM1-OM2 with medical management recommended, repeat cath in 11/2018 showing stable anatomy with patent LIMA-LAD and patent SVG-dRCA with 50% mid-graft stenosis and medical therapy recommended), chronic combined systolic and diastolic (EF 20-25% by echo in 12/2016, at 45-50% by echo in 05/2017, EF 25 to 30% by repeat echo in 01/19/19), HTN, HLD, COPD/emphysema andcontinuedtobacco use  admitted on 05/02/2019 with dyspnea and BNP over 4500 and clinical findings consistent with CHF exacerbation   A/p 1)HFrEF--- last known EF 25 to 30% based on echo from January 2021--- patient failed oral diuretics at home PTA -BNP > 4,500 (was only 496 on 01/18/19) -Troponin in the 40s -Chest x-ray and clinical exam consistent with CHF -Continue IV Lasix 12  hours, daily weights fluid input and output monitoring -Start Coreg 3.125 mg twice daily in a.m. -Start isosorbide/hydralazine combo -TED stockings placed  2) COPD/emphysema/ongoing tobacco abuse--- clinically no COPD flareup at this time continue as needed bronchodilators -Smoking cessation advised  3)CAD--status post prior CABG and prior angioplasty with stenting ---stable, no chest pains, borderline troponin elevation most likely secondary to CHF as above #1 rather than frank ACS -Continue Crestor, Coreg, isosorbide aspirin and Plavix  4) history of iron deficiency anemia--hemoglobin is currently above 13--appears clinically stable, no evidence of bleeding  5) chronic hyponatremia--- sodium currently 131 which is close to patient's usual baseline, -Monitor closely with diuresis -Avoid excessive free water  Disposition/Need for in-Hospital Stay- patient unable to be discharged at this time due to ---symptomatic CHF requiring IV diuresis after failing oral diuretics PTA -Patient From: home D/C Place: home Barriers: Not Clinically Stable- -symptomatic CHF requiring IV diuresis after failing oral diuretics PTA   Code Status : full  Family Communication:    (patient is alert, awake and coherent) -Husband at bedside  Consults  :  na  DVT Prophylaxis  :  - Heparin - SCDs   Lab Results  Component Value Date   PLT 236 05/03/2019    Inpatient Medications  Scheduled Meds: . aspirin EC  81 mg Oral Daily  . clopidogrel  75 mg  Oral Daily  . ferrous sulfate  325 mg Oral BID WC  . furosemide  40 mg Intravenous Q12H  . heparin  5,000 Units Subcutaneous Q8H  . hydrALAZINE  25 mg Oral TID  . metoprolol tartrate  12.5 mg Oral Daily  . multivitamin with minerals  1 tablet Oral Daily  . nitroGLYCERIN  0.5 inch Topical Q6H  . potassium chloride SA  20 mEq Oral Daily  . rosuvastatin  5 mg Oral Daily  . sodium chloride flush  3 mL Intravenous Q12H  . vitamin B-12  250 mcg Oral Daily    Continuous Infusions: . sodium chloride     PRN Meds:.sodium chloride, acetaminophen, albuterol, bisacodyl, nitroGLYCERIN, ondansetron **OR** ondansetron (ZOFRAN) IV, polyethylene glycol, sodium chloride flush    Anti-infectives (From admission, onward)   None        Objective:   Vitals:   05/03/19 0500 05/03/19 0807 05/03/19 0911 05/03/19 1236  BP:  (!) 142/101  102/79  Pulse:  76  80  Resp:  16    Temp:  97.6 F (36.4 C)  97.8 F (36.6 C)  TempSrc:  Oral  Oral  SpO2:  97% 95% 95%  Weight: 40.6 kg     Height:        Wt Readings from Last 3 Encounters:  05/03/19 40.6 kg  03/15/19 34.5 kg  03/01/19 37.6 kg     Intake/Output Summary (Last 24 hours) at 05/03/2019 1801 Last data filed at 05/03/2019 0600 Gross per 24 hour  Intake 350 ml  Output 650 ml  Net -300 ml     Physical Exam  Gen:- Awake Alert, in no acute distress HEENT:- [place].AT, No sclera icterus Neck-Supple Neck,No JVD,.  Lungs-diminished in bases, faint bibasilar rales  CV- S1, S2 normal, prior sternotomy scar, regular  Abd-  +ve B.Sounds, Abd Soft, No tenderness,    Extremity/Skin:-2+ pitting edema, pedal pulses present Psych-affect is appropriate, oriented x3 Neuro-generalized weakness no new focal deficits, no tremors   Data Review:   Micro Results Recent Results (from the past 240 hour(s))  Respiratory Panel by RT PCR (Flu A&B, Covid) - Nasopharyngeal Swab     Status: None   Collection Time: 05/02/19 10:00 PM   Specimen: Nasopharyngeal Swab  Result Value Ref Range Status   SARS Coronavirus 2 by RT PCR NEGATIVE NEGATIVE Final    Comment: (NOTE) SARS-CoV-2 target nucleic acids are NOT DETECTED. The SARS-CoV-2 RNA is generally detectable in upper respiratoy specimens during the acute phase of infection. The lowest concentration of SARS-CoV-2 viral copies this assay can detect is 131 copies/mL. A negative result does not preclude SARS-Cov-2 infection and should not be used as the sole  basis for treatment or other patient management decisions. A negative result may occur with  improper specimen collection/handling, submission of specimen other than nasopharyngeal swab, presence of viral mutation(s) within the areas targeted by this assay, and inadequate number of viral copies (<131 copies/mL). A negative result must be combined with clinical observations, patient history, and epidemiological information. The expected result is Negative. Fact Sheet for Patients:  PinkCheek.be Fact Sheet for Healthcare Providers:  GravelBags.it This test is not yet ap proved or cleared by the [place] FDA and  has been authorized for detection and/or diagnosis of SARS-CoV-2 by FDA under an Emergency Use Authorization (EUA). This EUA will remain  in effect (meaning this test can be used) for the duration of the COVID-19 declaration under Section 564(b)(1) of the Act, 21 U.S.C. section 360bbb-3(b)(1),  unless the authorization is terminated or revoked sooner.    Influenza A by PCR NEGATIVE NEGATIVE Final   Influenza B by PCR NEGATIVE NEGATIVE Final    Comment: (NOTE) The Xpert Xpress SARS-CoV-2/FLU/RSV assay is intended as an aid in  the diagnosis of influenza from Nasopharyngeal swab specimens and  should not be used as a sole basis for treatment. Nasal washings and  aspirates are unacceptable for Xpert Xpress SARS-CoV-2/FLU/RSV  testing. Fact Sheet for Patients: https://www.moore.com/ Fact Sheet for Healthcare Providers: https://www.young.biz/ This test is not yet approved or cleared by the [place] FDA and  has been authorized for detection and/or diagnosis of SARS-CoV-2 by  FDA under an Emergency Use Authorization (EUA). This EUA will remain  in effect (meaning this test can be used) for the duration of the  Covid-19 declaration under Section 564(b)(1) of the Act, 21  U.S.C.  section 360bbb-3(b)(1), unless the authorization is  terminated or revoked. Performed at [facility], [address] [place], [place] [address]     Radiology Reports DG Chest Portable 1 View  Result Date: 05/02/2019 CLINICAL DATA:  Short of breath, heart failure EXAM: PORTABLE CHEST 1 VIEW COMPARISON:  04/21/2019 FINDINGS: Single frontal view of the chest demonstrates persistent enlargement of the cardiac silhouette. Postsurgical changes from prior CABG. Background emphysema unchanged. There is chronic central vascular congestion without airspace disease, effusion, or pneumothorax. No acute bony abnormality. IMPRESSION: 1. Stable enlarged cardiac silhouette. 2. Chronic central vascular congestion, no acute process. 3. Emphysema. Electronically Signed   By: Sharlet Salina M.D.   On: 05/02/2019 19:10     CBC Recent Labs  Lab 05/02/19 1841 05/03/19 0558  WBC 6.5 5.6  HGB 14.3 13.7  HCT 43.4 42.2  PLT 250 236  MCV 101.2* 103.7*  MCH 33.3 33.7  MCHC 32.9 32.5  RDW 14.8 14.9  LYMPHSABS 1.1  --   MONOABS 0.7  --   EOSABS 0.1  --   BASOSABS 0.0  --     Chemistries  Recent Labs  Lab 05/02/19 1841 05/03/19 0558  NA 128* 131*  K 3.9 4.0  CL 92* 89*  CO2 26 30  GLUCOSE 122* 104*  BUN 13 14  CREATININE 1.10* 1.11*  CALCIUM 8.5* 8.6*  AST 21  --   ALT 19  --   ALKPHOS 71  --   BILITOT 0.5  --    ------------------------------------------------------------------------------------------------------------------ No results for input(s): CHOL, HDL, LDLCALC, TRIG, CHOLHDL, LDLDIRECT in the last 72 hours.  No results found for: HGBA1C ------------------------------------------------------------------------------------------------------------------ No results for input(s): TSH, T4TOTAL, T3FREE, THYROIDAB in the last 72 hours.  Invalid input(s): FREET3 ------------------------------------------------------------------------------------------------------------------ No results for  input(s): VITAMINB12, FOLATE, FERRITIN, TIBC, IRON, RETICCTPCT in the last 72 hours.  Coagulation profile No results for input(s): INR, PROTIME in the last 168 hours.  No results for input(s): DDIMER in the last 72 hours.  Cardiac Enzymes No results for input(s): CKMB, TROPONINI, MYOGLOBIN in the last 168 hours.  Invalid input(s): CK ------------------------------------------------------------------------------------------------------------------    Component Value Date/Time   BNP >4,500.0 (H) 05/02/2019 1841     Shon Hale M.D on 05/03/2019 at 6:01 PM  Go to [email or web address] - for contact info  [facility] - Office  [phone]

## 2019-05-04 DIAGNOSIS — N1831 Chronic kidney disease, stage 3a: Secondary | ICD-10-CM | POA: Diagnosis not present

## 2019-05-04 DIAGNOSIS — E785 Hyperlipidemia, unspecified: Secondary | ICD-10-CM | POA: Diagnosis not present

## 2019-05-04 DIAGNOSIS — I5023 Acute on chronic systolic (congestive) heart failure: Secondary | ICD-10-CM | POA: Diagnosis not present

## 2019-05-04 DIAGNOSIS — I10 Essential (primary) hypertension: Secondary | ICD-10-CM | POA: Diagnosis not present

## 2019-05-04 DIAGNOSIS — R778 Other specified abnormalities of plasma proteins: Secondary | ICD-10-CM | POA: Diagnosis not present

## 2019-05-04 DIAGNOSIS — J810 Acute pulmonary edema: Secondary | ICD-10-CM | POA: Diagnosis not present

## 2019-05-04 MED ORDER — CLOPIDOGREL BISULFATE 75 MG PO TABS: 75.0000 mg | ORAL_TABLET | Freq: Every day | ORAL | 5 refills | Status: DC

## 2019-05-04 MED ORDER — ACETAMINOPHEN 325 MG PO TABS: 650.0000 mg | ORAL_TABLET | Freq: Four times a day (QID) | ORAL | 4 refills | Status: DC | PRN

## 2019-05-04 MED ORDER — ALPRAZOLAM 0.25 MG PO TABS: 0.2500 mg | ORAL_TABLET | Freq: Every evening | ORAL | 0 refills | Status: DC | PRN

## 2019-05-04 MED ORDER — ASPIRIN EC 81 MG PO TBEC: 81.0000 mg | DELAYED_RELEASE_TABLET | Freq: Every day | ORAL | 3 refills | Status: DC

## 2019-05-04 MED ORDER — POTASSIUM CHLORIDE CRYS ER 20 MEQ PO TBCR: 20.0000 meq | EXTENDED_RELEASE_TABLET | Freq: Every day | ORAL | 3 refills | Status: DC

## 2019-05-04 MED ORDER — CARVEDILOL 3.125 MG PO TABS: 3.1250 mg | ORAL_TABLET | Freq: Two times a day (BID) | ORAL | 5 refills | Status: DC

## 2019-05-04 MED ORDER — ISOSORBIDE DINITRATE 5 MG PO TABS: 5.0000 mg | ORAL_TABLET | Freq: Two times a day (BID) | ORAL | 5 refills | Status: DC

## 2019-05-04 MED ORDER — HYDRALAZINE HCL 25 MG PO TABS: 25.0000 mg | ORAL_TABLET | Freq: Three times a day (TID) | ORAL | 3 refills | Status: DC

## 2019-05-04 MED ORDER — ALBUTEROL SULFATE HFA 108 (90 BASE) MCG/ACT IN AERS: 1.0000 | INHALATION_SPRAY | Freq: Four times a day (QID) | RESPIRATORY_TRACT | 3 refills | Status: DC | PRN

## 2019-05-04 MED ORDER — HALOPERIDOL LACTATE 5 MG/ML IJ SOLN
1.0000 mg | Freq: Once | INTRAMUSCULAR | Status: AC
  Administered 2019-05-04: 1 mg via INTRAVENOUS
  Filled 2019-05-04: qty 1

## 2019-05-04 NOTE — Care Management Obs Status (Signed)
MEDICARE OBSERVATION STATUS NOTIFICATION   Patient Details  Name: Susan [name] MRN: [id number] Date of Birth: 02-19-1945   Medicare Observation Status Notification Given:  Yes    Leitha Bleak, RN 05/04/2019, 8:53 AM

## 2019-05-04 NOTE — Care Management CC44 (Signed)
Condition Code 44 Documentation Completed  Patient Details  Name: Susan [name] MRN: [id number] Date of Birth: [birth year]   Condition Code 44 given:  Yes Patient signature on Condition Code 44 notice:  Yes Documentation of 2 MD's agreement:  Yes Code 44 added to claim:  Yes    Leitha Bleak, RN 05/04/2019, 8:53 AM

## 2019-05-04 NOTE — Progress Notes (Signed)
Nsg Discharge Note  Admit Date:  05/02/2019 Discharge date: 05/04/2019   Susan [name] to be D/C'd Home per MD order.  AVS completed.  Copy for chart, and copy for patient signed, and dated. Patient/caregiver able to verbalize understanding.  Discharge Medication: Allergies as of 05/04/2019   No Known Allergies     Medication List    STOP taking these medications   metoprolol tartrate 25 MG tablet Commonly known as: LOPRESSOR     TAKE these medications   acetaminophen 325 MG tablet Commonly known as: TYLENOL Take 2 tablets (650 mg total) by mouth every 6 (six) hours as needed for mild pain, fever or headache.   albuterol 108 (90 Base) MCG/ACT inhaler Commonly known as: VENTOLIN HFA Inhale 1-2 puffs into the lungs every 6 (six) hours as needed for wheezing or shortness of breath.   ALPRAZolam 0.25 MG tablet Commonly known as: Xanax Take 1 tablet (0.25 mg total) by mouth at bedtime as needed for anxiety.   aspirin EC 81 MG tablet Take 1 tablet (81 mg total) by mouth daily with breakfast. What changed: when to take this   carvedilol 3.125 MG tablet Commonly known as: COREG Take 1 tablet (3.125 mg total) by mouth 2 (two) times daily with a meal.   clopidogrel 75 MG tablet Commonly known as: PLAVIX Take 1 tablet (75 mg total) by mouth daily.   ferrous sulfate 325 (65 FE) MG tablet Take 1 tablet (325 mg total) by mouth 2 (two) times daily with a meal.   furosemide 40 MG tablet Commonly known as: LASIX Take 1 tablet (40 mg total) by mouth daily. (may take extra as needed for weight gain of 3 pounds in 1 day or 5 pounds in 1 week)   hydrALAZINE 25 MG tablet Commonly known as: APRESOLINE Take 1 tablet (25 mg total) by mouth 3 (three) times daily.   isosorbide dinitrate 5 MG tablet Commonly known as: ISORDIL Take 1 tablet (5 mg total) by mouth 2 (two) times daily.   multivitamin tablet Take 1 tablet by mouth daily.   nitroGLYCERIN 0.4 MG SL tablet Commonly known  as: NITROSTAT Place 1 tablet (0.4 mg total) under the tongue every 5 (five) minutes x 3 doses as needed for chest pain.   potassium chloride SA 20 MEQ tablet Commonly known as: Klor-Con M20 Take 1 tablet (20 mEq total) by mouth daily. (may take one extra tab when taking extra Lasix)   rosuvastatin 5 MG tablet Commonly known as: CRESTOR Take 5 mg by mouth daily.   vitamin B-12 250 MCG tablet Commonly known as: CYANOCOBALAMIN Take 250 mcg by mouth daily.       Discharge Assessment: Vitals:   05/04/19 0745 05/04/19 0907  BP:  (!) 160/100  Pulse:  92  Resp:    Temp:    SpO2: 99%    Skin clean, dry and intact without evidence of skin break down, no evidence of skin tears noted. IV catheter discontinued intact. Site without signs and symptoms of complications - no redness or edema noted at insertion site, patient denies c/o pain - only slight tenderness at site.  Dressing with slight pressure applied.  D/c Instructions-Education: Discharge instructions given to patient/family with verbalized understanding. D/c education completed with patient/family including follow up instructions, medication list, d/c activities limitations if indicated, with other d/c instructions as indicated by MD - patient able to verbalize understanding, all questions fully answered. Patient instructed to return to ED, call 911, or call MD for  any changes in condition.  Patient escorted via [place], and D/C home via private auto.  Zenaida Deed, RN 05/04/2019 4:28 PM

## 2019-05-04 NOTE — Discharge Summary (Signed)
Susan [name], is a 74 y.o. female  DOB 1945/11/26  MRN [id number].  Admission date:  05/02/2019  Admitting Physician  Shon Hale, MD  Discharge Date:  05/04/2019   Primary MD  [name], MD  Recommendations for primary care physician for things to follow:   1)Very low-salt diet advised 2)Weigh yourself daily, call if you gain more than 3 pounds in 1 day or more than 5 pounds in 1 week as your diuretic medications may need to be adjusted 3)Limit your Fluid  intake to no more than 60 ounces (1.8 Liters) per day 4) you are taking aspirin and Plavix which are blood thinners so Avoid ibuprofen/Advil/Aleve/Motrin/Goody Powders/Naproxen/BC powders/Meloxicam/Diclofenac/Indomethacin and other Nonsteroidal anti-inflammatory medications as these will make you more likely to bleed and can cause stomach ulcers, can also cause Kidney problems.  5) please wear your TED stockings during the day, may take them off at bedtime 6) follow-up with your primary care physician in about 10 to 14 days for recheck and reevaluation  Admission Diagnosis  Acute pulmonary edema (HCC) [J81.0] SOB (shortness of breath) [R06.02] CHF exacerbation (HCC) [I50.9] Acute on chronic congestive heart failure, unspecified heart failure type (HCC) [I50.9] Acute exacerbation of CHF (congestive heart failure) (HCC) [I50.9]  Discharge Diagnosis  Acute pulmonary edema (HCC) [J81.0] SOB (shortness of breath) [R06.02] CHF exacerbation (HCC) [I50.9] Acute on chronic congestive heart failure, unspecified heart failure type (HCC) [I50.9] Acute exacerbation of CHF (congestive heart failure) (HCC) [I50.9]   Active Problems:   Ischemic cardiomyopathy   Hx of CABG   Pulmonary hypertension (HCC)   Dyslipidemia   Acute on chronic systolic and diastolic heart failure, NYHA class 1 (HCC)   CKD (chronic kidney disease) stage 3, GFR 30-59 ml/min   HTN  (hypertension)   Elevated troponin   CHF exacerbation (HCC)   Acute exacerbation of CHF (congestive heart failure) (HCC)      Past Medical History:  Diagnosis Date  . Acute on chronic systolic and diastolic heart failure, NYHA class 1 (HCC) 11/21/2018  . Anxiety   . CAD (coronary artery disease)    a. s/p CABG x5 in 2000 b. cath in 02/2017 showing 2/5 patent grafts with patent LIMA-LAD, patent SVG-distal RCA, occluded SVG-D1 and occluded seq-SVG-OM1-OM2 with medical management recommended and possible PCI of distal RCA branches in the future if recurrent anginal symptoms  . CHF (congestive heart failure) (HCC)    a. EF 20-25% by echo in 12/2016 b. EF 45-50% by echo in 05/2017 c. EF 30% by echo in 11/2018  . Chronic bronchitis (HCC)   . CKD (chronic kidney disease) stage 3, GFR 30-59 ml/min 11/21/2018  . COPD (chronic obstructive pulmonary disease) (HCC)   . Dizziness   . Esophageal reflux   . Essential hypertension   . Fatigue   . HLD (hyperlipidemia) 11/21/2018  . HTN (hypertension) 11/21/2018  . Hypercholesteremia   . Hyperkalemia   . Ovarian failure   . SOB (shortness of breath)     Past Surgical History:  Procedure Laterality Date  .  ABDOMINAL HYSTERECTOMY    . APPENDECTOMY    . BIOPSY  01/22/2019   Procedure: BIOPSY;  Surgeon: Beverley Fiedler, MD;  Location: [facility] ENDOSCOPY;  Service: Gastroenterology;;  . BREAST BIOPSY Right   . CARDIAC CATHETERIZATION  2000  . CORONARY ARTERY BYPASS GRAFT  2000   CABG X5  . ESOPHAGOGASTRODUODENOSCOPY (EGD) WITH PROPOFOL N/A 01/22/2019   Procedure: ESOPHAGOGASTRODUODENOSCOPY (EGD) WITH PROPOFOL;  Surgeon: Beverley Fiedler, MD;  Location[facility] ENDOSCOPY;  Service: Gastroenterology;  Laterality: N/A;  . LEFT HEART CATH AND CORS/GRAFTS ANGIOGRAPHY N/A 02/17/2017   Procedure: LEFT HEART CATH AND CORS/GRAFTS ANGIOGRAPHY;  Surgeon: Kathleene Hazel, MD;  Location: [facility] INVASIVE CV LAB;  Service: Cardiovascular;  Laterality: N/A;  . LEFT HEART CATH  AND CORS/GRAFTS ANGIOGRAPHY N/A 11/20/2018   Procedure: LEFT HEART CATH AND CORS/GRAFTS ANGIOGRAPHY;  Surgeon: Yvonne Kendall, MD;  Location: [facility] INVASIVE CV LAB;  Service: Cardiovascular;  Laterality: N/A;     HPI  from the history and physical done on the day of admission:     HPI: Susan [name] is a 74 y.o. female with medical history significant of CAD (s/p CABG x5 in 2000 with cath in 11/2018 showing 2/5 patent grafts with patent LIMA-LAD, patent SVG-distal RCA, occluded SVG-D1 and occluded seq-SVG-OM1-OM2 with medical management recommended, repeat cath in 11/2018 showing stable anatomy with patent LIMA-LAD and patent SVG-dRCA with 50% mid-graft stenosis and medical therapy recommended), chronic combined systolic and diastolic (EF 20-25% by echo in 12/2016, at 45-50% by echo in 05/2017, 30% by repeat echo in 11/2018), HTN, HLD, COPD andcontinuedtobacco use who presented to the ER with worsening shortness of breath.  Patient has been having worsening dyspnea on exertion over the last 2 to 3 days.  Also reports increased bilateral lower extremity swelling.  Has had orthopnea as well as PND.  No fever, chills, cough, palpitations, nausea, vomiting, diarrhea, abdominal pain, hematemesis, melena, hematochezia.  Has been on Lasix at home. ED Course:  Vital Signs reviewed on presentation, significant for temperature 98.4, heart rate 71, blood pressure 145/93, saturation 95% on room air. Labs reviewed, significant for sodium 128, potassium 3.9, chloride 92, BUN 13, creatinine 1.1, albumin 3.1, LFTs within normal limits, BNP more than 4500, troponin 49, WBC count 6.5, hemoglobin 14.3, hematocrit 43, platelets 250. Imaging personally Reviewed, chest x-ray shows central pulmonary vascular congestion. EKG personally reviewed, shows sinus tachycardia, PVCs, LVH   Hospital Course:    Brief Summary:- 74 y.o.femalewith medical history significant ofCAD (s/p CABG x5 in 2000 with cath  in11/2020showing 2/5 patent grafts with patent LIMA-LAD, patent SVG-distal RCA, occluded SVG-D1 and occluded seq-SVG-OM1-OM2 with medical management recommended, repeat cath in 11/2018 showing stable anatomy with patent LIMA-LAD and patent SVG-dRCA with 50% mid-graft stenosis and medical therapy recommended), chronic combined systolic and diastolic (EF 20-25% by echo in 12/2016, at 45-50% by echo in 05/2017, EF 25 to 30% by repeat echo in 01/19/19), HTN, HLD, COPD/emphysema andcontinuedtobacco use admitted on 05/02/2019 with dyspnea and BNP over 4500 and clinical findings consistent with CHF exacerbation  A/p 1)HFrEF--- last known EF 25 to 30% based on echo from January 2021--- patient failed oral diuretics at home PTA -BNP > 4,500 (was only 496 on 01/18/19) -Troponin in the 40s - During this admission chest x-ray and clinical exam consistent with CHF -Treated with IV Lasix, much improved without diuresis -Patient ambulated without significant dyspnea on exertional hypoxia post diuresis -Okay to continue Coreg 3.125 mg twice daily  -Okay to continue isosorbide/hydralazine combo -TED  stockings every morning as advised  2) COPD/emphysema/ongoing tobacco abuse--- clinically no COPD flareup at this time continue as needed bronchodilators -Smoking cessation advised  3)CAD--status post prior CABG and prior angioplasty with stenting ---stable, no chest pains, borderline troponin elevation most likely secondary to CHF as above #1 rather than frank ACS -Continue Crestor, Coreg, isosorbide aspirin and Plavix  4) history of iron deficiency anemia--hemoglobin is currently above 13--appears clinically stable, no evidence of bleeding  5) chronic hyponatremia--- sodium currently 131 which is close to patient's usual baseline, -Monitor closely with diuresis -Avoid excessive free water  Disposition- improved overall---discharge home  -Patient From: home D/C Place: home  Code Status :  full  Family Communication:    (patient is alert, awake and coherent) -Husband at bedside  Consults  :  na Discharge Condition: stable  Follow UP  Follow-up Information    [name], MD. Schedule an appointment as soon as possible for a visit in 10 day(s).   Specialty: Internal Medicine Why: Follow-up in 10 to 14 days for recheck and reevaluation Contact information: [address] [place] [address] [phone]        Laqueta Linden, MD .   Specialty: Cardiology Contact information: [address] [place] [address] [phone]            Consults obtained - na  Diet and Activity recommendation:  As advised  Discharge Instructions   Discharge Instructions    Call MD for:  difficulty breathing, headache or visual disturbances   Complete by: As directed    Call MD for:  persistant dizziness or light-headedness   Complete by: As directed    Call MD for:  persistant nausea and vomiting   Complete by: As directed    Call MD for:  severe uncontrolled pain   Complete by: As directed    Call MD for:  temperature >100.4   Complete by: As directed    Diet - low sodium heart healthy   Complete by: As directed    Discharge instructions   Complete by: As directed    1)Very low-salt diet advised 2)Weigh yourself daily, call if you gain more than 3 pounds in 1 day or more than 5 pounds in 1 week as your diuretic medications may need to be adjusted 3)Limit your Fluid  intake to no more than 60 ounces (1.8 Liters) per day 4) you are taking aspirin and Plavix which are blood thinners so Avoid ibuprofen/Advil/Aleve/Motrin/Goody Powders/Naproxen/BC powders/Meloxicam/Diclofenac/Indomethacin and other Nonsteroidal anti-inflammatory medications as these will make you more likely to bleed and can cause stomach ulcers, can also cause Kidney problems.  5) please wear your TED stockings during the day, may take them off at bedtime 6) follow-up with your primary care  physician in about 10 to 14 days for recheck and reevaluation   Increase activity slowly   Complete by: As directed        Discharge Medications     Allergies as of 05/04/2019   No Known Allergies     Medication List    STOP taking these medications   metoprolol tartrate 25 MG tablet Commonly known as: LOPRESSOR     TAKE these medications   acetaminophen 325 MG tablet Commonly known as: TYLENOL Take 2 tablets (650 mg total) by mouth every 6 (six) hours as needed for mild pain, fever or headache.   albuterol 108 (90 Base) MCG/ACT inhaler Commonly known as: VENTOLIN HFA Inhale 1-2 puffs into the lungs every 6 (six)  hours as needed for wheezing or shortness of breath.   ALPRAZolam 0.25 MG tablet Commonly known as: Xanax Take 1 tablet (0.25 mg total) by mouth at bedtime as needed for anxiety.   aspirin EC 81 MG tablet Take 1 tablet (81 mg total) by mouth daily with breakfast. What changed: when to take this   carvedilol 3.125 MG tablet Commonly known as: COREG Take 1 tablet (3.125 mg total) by mouth 2 (two) times daily with a meal.   clopidogrel 75 MG tablet Commonly known as: PLAVIX Take 1 tablet (75 mg total) by mouth daily.   ferrous sulfate 325 (65 FE) MG tablet Take 1 tablet (325 mg total) by mouth 2 (two) times daily with a meal.   furosemide 40 MG tablet Commonly known as: LASIX Take 1 tablet (40 mg total) by mouth daily. (may take extra as needed for weight gain of 3 pounds in 1 day or 5 pounds in 1 week)   hydrALAZINE 25 MG tablet Commonly known as: APRESOLINE Take 1 tablet (25 mg total) by mouth 3 (three) times daily.   isosorbide dinitrate 5 MG tablet Commonly known as: ISORDIL Take 1 tablet (5 mg total) by mouth 2 (two) times daily.   multivitamin tablet Take 1 tablet by mouth daily.   nitroGLYCERIN 0.4 MG SL tablet Commonly known as: NITROSTAT Place 1 tablet (0.4 mg total) under the tongue every 5 (five) minutes x 3 doses as needed for chest  pain.   potassium chloride SA 20 MEQ tablet Commonly known as: Klor-Con M20 Take 1 tablet (20 mEq total) by mouth daily. (may take one extra tab when taking extra Lasix)   rosuvastatin 5 MG tablet Commonly known as: CRESTOR Take 5 mg by mouth daily.   vitamin B-12 250 MCG tablet Commonly known as: CYANOCOBALAMIN Take 250 mcg by mouth daily.      Major procedures and Radiology Reports - PLEASE review detailed and final reports for all details, in brief -   DG Chest Portable 1 View  Result Date: 05/02/2019 CLINICAL DATA:  Short of breath, heart failure EXAM: PORTABLE CHEST 1 VIEW COMPARISON:  04/21/2019 FINDINGS: Single frontal view of the chest demonstrates persistent enlargement of the cardiac silhouette. Postsurgical changes from prior CABG. Background emphysema unchanged. There is chronic central vascular congestion without airspace disease, effusion, or pneumothorax. No acute bony abnormality. IMPRESSION: 1. Stable enlarged cardiac silhouette. 2. Chronic central vascular congestion, no acute process. 3. Emphysema. Electronically Signed   By: Sharlet Salina M.D.   On: 05/02/2019 19:10   Micro Results   Recent Results (from the past 240 hour(s))  Respiratory Panel by RT PCR (Flu A&B, Covid) - Nasopharyngeal Swab     Status: None   Collection Time: 05/02/19 10:00 PM   Specimen: Nasopharyngeal Swab  Result Value Ref Range Status   SARS Coronavirus 2 by RT PCR NEGATIVE NEGATIVE Final    Comment: (NOTE) SARS-CoV-2 target nucleic acids are NOT DETECTED. The SARS-CoV-2 RNA is generally detectable in upper respiratoy specimens during the acute phase of infection. The lowest concentration of SARS-CoV-2 viral copies this assay can detect is 131 copies/mL. A negative result does not preclude SARS-Cov-2 infection and should not be used as the sole basis for treatment or other patient management decisions. A negative result may occur with  improper specimen collection/handling, submission  of specimen other than nasopharyngeal swab, presence of viral mutation(s) within the areas targeted by this assay, and inadequate number of viral copies (<131 copies/mL). A negative result  must be combined with clinical observations, patient history, and epidemiological information. The expected result is Negative. Fact Sheet for Patients:  https://www.moore.com/ Fact Sheet for Healthcare Providers:  https://www.young.biz/ This test is not yet ap proved or cleared by the [place] FDA and  has been authorized for detection and/or diagnosis of SARS-CoV-2 by FDA under an Emergency Use Authorization (EUA). This EUA will remain  in effect (meaning this test can be used) for the duration of the COVID-19 declaration under Section 564(b)(1) of the Act, 21 U.S.C. section 360bbb-3(b)(1), unless the authorization is terminated or revoked sooner.    Influenza A by PCR NEGATIVE NEGATIVE Final   Influenza B by PCR NEGATIVE NEGATIVE Final    Comment: (NOTE) The Xpert Xpress SARS-CoV-2/FLU/RSV assay is intended as an aid in  the diagnosis of influenza from Nasopharyngeal swab specimens and  should not be used as a sole basis for treatment. Nasal washings and  aspirates are unacceptable for Xpert Xpress SARS-CoV-2/FLU/RSV  testing. Fact Sheet for Patients: https://www.moore.com/ Fact Sheet for Healthcare Providers: https://www.young.biz/ This test is not yet approved or cleared by the [place] FDA and  has been authorized for detection and/or diagnosis of SARS-CoV-2 by  FDA under an Emergency Use Authorization (EUA). This EUA will remain  in effect (meaning this test can be used) for the duration of the  Covid-19 declaration under Section 564(b)(1) of the Act, 21  U.S.C. section 360bbb-3(b)(1), unless the authorization is  terminated or revoked. Performed at [facility], [address], [place], [place]  [address]    Today   Subjective   Susan [name] today has no new concerns -No fever  Or chills   No Nausea, Vomiting or Diarrhea -Ambulated, post ablation O2 sats in the mid 90s, on room air   Patient has been seen and examined prior to discharge   Objective   Blood pressure (!) 160/100, pulse 92, temperature 97.7 F (36.5 C), temperature source Oral, resp. rate 16, height 5' (1.524 m), weight 40.6 kg, SpO2 99 %.   Intake/Output Summary (Last 24 hours) at 05/04/2019 1601 Last data filed at 05/04/2019 1200 Gross per 24 hour  Intake 320 ml  Output --  Net 320 ml   Exam Gen:- Awake Alert, no acute distress  HEENT:- Sophia.AT, No sclera icterus Neck-Supple Neck,No JVD,.  Lungs-much improved air movement bilaterally, no wheezing or rales  CV- S1, S2 normal, regular, prior sternotomy scar Abd-  +ve B.Sounds, Abd Soft, No tenderness,    Extremity/Skin:-Improved lower extremity edema,  TED stockings good pulses Psych-affect is appropriate, oriented x3 Neuro-gait is steady, no new focal deficits, no tremors    Data Review   CBC w Diff:  Lab Results  Component Value Date   WBC 5.6 05/03/2019   HGB 13.7 05/03/2019   HCT 42.2 05/03/2019   PLT 236 05/03/2019   LYMPHOPCT 16 05/02/2019   MONOPCT 10 05/02/2019   EOSPCT 1 05/02/2019   BASOPCT 1 05/02/2019    CMP:  Lab Results  Component Value Date   NA 131 (L) 05/03/2019   K 4.0 05/03/2019   CL 89 (L) 05/03/2019   CO2 30 05/03/2019   BUN 14 05/03/2019   CREATININE 1.11 (H) 05/03/2019   PROT 6.4 (L) 05/02/2019   ALBUMIN 3.1 (L) 05/02/2019   BILITOT 0.5 05/02/2019   ALKPHOS 71 05/02/2019   AST 21 05/02/2019   ALT 19 05/02/2019    Total Discharge time is about 33 minutes  Shon Hale M.D on 05/04/2019 at  4:01 PM  Go to [email or web address] -  for contact info  [facility] - Office  [phone]

## 2019-05-14 ENCOUNTER — Encounter: Payer: Self-pay | Admitting: *Deleted

## 2019-05-14 ENCOUNTER — Encounter: Payer: Self-pay | Admitting: Family Medicine

## 2019-05-14 ENCOUNTER — Other Ambulatory Visit: Payer: Self-pay

## 2019-05-14 ENCOUNTER — Ambulatory Visit (INDEPENDENT_AMBULATORY_CARE_PROVIDER_SITE_OTHER): Payer: Medicare PPO | Admitting: Family Medicine

## 2019-05-14 VITALS — BP 124/82 | HR 115 | Ht 60.0 in | Wt 89.2 lb

## 2019-05-14 DIAGNOSIS — I2510 Atherosclerotic heart disease of native coronary artery without angina pectoris: Secondary | ICD-10-CM | POA: Diagnosis not present

## 2019-05-14 DIAGNOSIS — I10 Essential (primary) hypertension: Secondary | ICD-10-CM

## 2019-05-14 DIAGNOSIS — Z720 Tobacco use: Secondary | ICD-10-CM

## 2019-05-14 DIAGNOSIS — K922 Gastrointestinal hemorrhage, unspecified: Secondary | ICD-10-CM

## 2019-05-14 DIAGNOSIS — I5022 Chronic systolic (congestive) heart failure: Secondary | ICD-10-CM | POA: Diagnosis not present

## 2019-05-14 MED ORDER — CARVEDILOL 6.25 MG PO TABS: 6.2500 mg | ORAL_TABLET | Freq: Two times a day (BID) | ORAL | 6 refills | Status: DC

## 2019-05-14 NOTE — Patient Instructions (Signed)
Medication Instructions:   Increase Coreg to 6.25mg  twice a day.  Continue all other medications.    Labwork: none  Testing/Procedures: none  Follow-Up: 1 month   Any Other Special Instructions Will Be Listed Below (If Applicable).  If you need a refill on your cardiac medications before your next appointment, please call your pharmacy.

## 2019-05-14 NOTE — Progress Notes (Signed)
Cardiology Office Note  Date: 05/14/2019   ID: Susan [name], DOB 1945-03-09, MRN [id number]  PCP:  [name], MD  Cardiologist:  Prentice Docker, MD Electrophysiologist:  None   Chief Complaint: Follow-up CAD, non-STEMI, GI bleed, ischemic cardiomyopathy, systolic heart failure with reduced EF  History of Present Illness: Susan [name] is a 74 y.o. female with a history of  CAD, non-STEMI, GI bleed.  Last seen by Dr. Purvis Sheffield on 12/11/2018.  She was doing well.  She had undergone a cardiac catheterization in November 2020 which showed normal overall stable appearance of coronary vessels and grafts.  Medical management was recommended with DAPT therapy for 12 months if tolerated.  She was hospitalized January 2021 with acute anemia secondary to GI bleed.  Underwent EGD on 01/21/2018 which showed ulcerative gastritis with no active bleeding.  GI recommended twice daily Protonix.  She had been chronically taking NSAIDs.  Had a follow-up echocardiogram on 01/19/2019 demonstrating severely reduced LV systolic function with EF of 25 to 30%.  She was normovolemic.  She had undergone multiple medication changes.  She was not taking Plavix, nitrates, or Toprol.  She was taking Lopressor.  She denied any exertional chest pain or dyspnea.  Had some mild ankle swelling relieved with diuretics.  Denied any bleeding in stool.  She continued to smoke 1/2 pack cigarettes daily.  Patient is here for follow-up status post recent hospital stay and echocardiogram showing significant decrease in LV function with EF of 25 to 30%.  She has a history of significant coronary artery disease with 5 vessel bypass in 2000.Marland Kitchen   Past Medical History:  Diagnosis Date  . Acute on chronic systolic and diastolic heart failure, NYHA class 1 (HCC) 11/21/2018  . Anxiety   . CAD (coronary artery disease)    a. s/p CABG x5 in 2000 b. cath in 02/2017 showing 2/5 patent grafts with patent LIMA-LAD, patent SVG-distal RCA,  occluded SVG-D1 and occluded seq-SVG-OM1-OM2 with medical management recommended and possible PCI of distal RCA branches in the future if recurrent anginal symptoms  . CHF (congestive heart failure) (HCC)    a. EF 20-25% by echo in 12/2016 b. EF 45-50% by echo in 05/2017 c. EF 30% by echo in 11/2018  . Chronic bronchitis (HCC)   . CKD (chronic kidney disease) stage 3, GFR 30-59 ml/min 11/21/2018  . COPD (chronic obstructive pulmonary disease) (HCC)   . Dizziness   . Esophageal reflux   . Essential hypertension   . Fatigue   . HLD (hyperlipidemia) 11/21/2018  . HTN (hypertension) 11/21/2018  . Hypercholesteremia   . Hyperkalemia   . Ovarian failure   . SOB (shortness of breath)     Past Surgical History:  Procedure Laterality Date  . ABDOMINAL HYSTERECTOMY    . APPENDECTOMY    . BIOPSY  01/22/2019   Procedure: BIOPSY;  Surgeon: Beverley Fiedler, MD;  Location: [facility] ENDOSCOPY;  Service: Gastroenterology;;  . BREAST BIOPSY Right   . CARDIAC CATHETERIZATION  2000  . CORONARY ARTERY BYPASS GRAFT  2000   CABG X5  . ESOPHAGOGASTRODUODENOSCOPY (EGD) WITH PROPOFOL N/A 01/22/2019   Procedure: ESOPHAGOGASTRODUODENOSCOPY (EGD) WITH PROPOFOL;  Surgeon: Beverley Fiedler, MD;  Location: [facility] ENDOSCOPY;  Service: Gastroenterology;  Laterality: N/A;  . LEFT HEART CATH AND CORS/GRAFTS ANGIOGRAPHY N/A 02/17/2017   Procedure: LEFT HEART CATH AND CORS/GRAFTS ANGIOGRAPHY;  Surgeon: Kathleene Hazel, MD;  Location: [facility] INVASIVE CV LAB;  Service: Cardiovascular;  Laterality: N/A;  . LEFT HEART CATH  AND CORS/GRAFTS ANGIOGRAPHY N/A 11/20/2018   Procedure: LEFT HEART CATH AND CORS/GRAFTS ANGIOGRAPHY;  Surgeon: Yvonne Kendall, MD;  Location: [facility] INVASIVE CV LAB;  Service: Cardiovascular;  Laterality: N/A;    Current Outpatient Medications  Medication Sig Dispense Refill  . acetaminophen (TYLENOL) 325 MG tablet Take 2 tablets (650 mg total) by mouth every 6 (six) hours as needed for mild pain, fever or headache. 30  tablet 4  . albuterol (VENTOLIN HFA) 108 (90 Base) MCG/ACT inhaler Inhale 1-2 puffs into the lungs every 6 (six) hours as needed for wheezing or shortness of breath. 18 g 3  . ALPRAZolam (XANAX) 0.25 MG tablet Take 1 tablet (0.25 mg total) by mouth at bedtime as needed for anxiety. 30 tablet 0  . aspirin EC 81 MG tablet Take 1 tablet (81 mg total) by mouth daily with breakfast. 30 tablet 3  . carvedilol (COREG) 3.125 MG tablet Take 1 tablet (3.125 mg total) by mouth 2 (two) times daily with a meal. 60 tablet 5  . clopidogrel (PLAVIX) 75 MG tablet Take 1 tablet (75 mg total) by mouth daily. 30 tablet 5  . ferrous sulfate 325 (65 FE) MG tablet Take 1 tablet (325 mg total) by mouth 2 (two) times daily with a meal. 90 tablet 3  . furosemide (LASIX) 40 MG tablet Take 1 tablet (40 mg total) by mouth daily. (may take extra as needed for weight gain of 3 pounds in 1 day or 5 pounds in 1 week) 100 tablet 3  . hydrALAZINE (APRESOLINE) 25 MG tablet Take 1 tablet (25 mg total) by mouth 3 (three) times daily. 270 tablet 3  . isosorbide dinitrate (ISORDIL) 5 MG tablet Take 5 mg by mouth 2 (two) times daily.    . Multiple Vitamin (MULTIVITAMIN) tablet Take 1 tablet by mouth daily.    . nitroGLYCERIN (NITROSTAT) 0.4 MG SL tablet Place 1 tablet (0.4 mg total) under the tongue every 5 (five) minutes x 3 doses as needed for chest pain. 25 tablet 3  . potassium chloride SA (KLOR-CON M20) 20 MEQ tablet Take 1 tablet (20 mEq total) by mouth daily. (may take one extra tab when taking extra Lasix) 100 tablet 3  . rosuvastatin (CRESTOR) 5 MG tablet Take 5 mg by mouth daily.    . vitamin B-12 (CYANOCOBALAMIN) 250 MCG tablet Take 250 mcg by mouth daily.     No current facility-administered medications for this visit.   Allergies:  Patient has no known allergies.   Social History: The patient  reports that she has been smoking cigarettes. She has a 50.00 pack-year smoking history. She has never used smokeless tobacco. She  reports that she does not drink alcohol or use drugs.   Family History: The patient's family history includes Heart attack in her mother; Hypertension in her father.   ROS:  Please see the history of present illness. Otherwise, complete review of systems is positive for none.  All other systems are reviewed and negative.   Physical Exam: VS:  Ht 5' (1.524 m)   Wt 89 lb 3.2 oz (40.5 kg)   BMI 17.42 kg/m , BMI Body mass index is 17.42 kg/m.  Wt Readings from Last 3 Encounters:  05/14/19 89 lb 3.2 oz (40.5 kg)  05/03/19 89 lb 8.1 oz (40.6 kg)  03/15/19 76 lb (34.5 kg)    General: Patient appears comfortable at rest. Neck: Supple, no elevated JVP or carotid bruits, no thyromegaly. Lungs: Diminished with prolonged expiratory phase, nonlabored breathing at  rest. Cardiac: Irregularly irregular rate and rhythm, no S3 or significant systolic murmur, no pericardial rub. Abdomen: Soft, nontender, no hepatomegaly, bowel sounds present, no guarding or rebound. Extremities: 2+ pitting edema bilaterally, distal pulses 2+. Skin: Warm and dry. Musculoskeletal: No kyphosis. Neuropsychiatric: Alert and oriented x3, affect grossly appropriate.  ECG:  An ECG dated 05/14/2019 was personally reviewed today and demonstrated:  Sinus rhythm with premature atrial complexes with aberrant conduction rate of 91, biatrial enlargement, ST and T wave abnormality, consider lateral ischemia, prolonged QT QT/QTc 414/509 ms  Recent Labwork: 01/24/2019: Magnesium 1.9 05/02/2019: ALT 19; AST 21; B Natriuretic Peptide >4,500.0 05/03/2019: BUN 14; Creatinine, Ser 1.11; Hemoglobin 13.7; Platelets 236; Potassium 4.0; Sodium 131     Component Value Date/Time   CHOL 198 11/20/2018 0516   TRIG 114 11/20/2018 0516   HDL 77 11/20/2018 0516   CHOLHDL 2.6 11/20/2018 0516   VLDL 23 11/20/2018 0516   LDLCALC 98 11/20/2018 0516    Other Studies Reviewed Today:  Echocardiogram 01/19/2019:  1. Left ventricular ejection fraction,  by visual estimation, is 25 to  30%. The left ventricle has severely decreased function. Left ventricular  septal wall thickness was normal.  2. Left ventricular diastolic function could not be evaluated.  3. Severely dilated left ventricular internal cavity size.  4. Findings consistent with multivessel CAD with prior inferior and  anterior (Septal /apical) wall infarcts findings similar to those on echo  November 2020.  5. Global right ventricle has normal systolc function.The right  ventricular size is normal. no increase in right ventricular wall  thickness.  6. Left atrial size was mildly dilated.  7. The tricuspid valve was not assessed.  8. Mild aortic valve sclerosis without stenosis.  9. Aortic root could not be assessed.  10. ? prominent chiarri network at dome of RA in 4 chamber view.    11/20/18 Cardiac cath Conclusions: 1. Overall stable appearance of the coronary arteries with severe three-vessel coronary artery disease, including chronic total occlusions of the mid LAD, mid LCx, and proximal RCA. 2. Widely patent LIMA-LAD. 3. Patent SVG-distal RCA with up to 50% stenosis in the mid graft. Bifurcation disease involving the distal RCA is similar to prior catheterization, with 90% lesions involving the ostial/proximal rPDA and ostial/proximal RCA continuation. 4. Normal left ventricular filling pressure.  Recommendations: 1. Medical optimization, including escalation of evidence-based heart failure therapy, improved blood pressure control, and smoking cessation. 2. Dual antiplatelet therapy with aspirin and clopidogrel for at least 12 months, if tolerated. 3. If symptoms recur, complex PCI to the distal RCA bifurcation will need to be considered. As previously noted, this may requiring "kissing stents" at the ostia of the rPDA and RCA continuation. This would be a potentially high risk procedure, given the territory supplied (via native vessels and  collaterals). Assessment and Plan:  1. Chronic systolic heart failure ([place])   2. CAD in native artery   3. Essential hypertension   4. Tobacco abuse disorder   5. Gastrointestinal hemorrhage, unspecified gastrointestinal hemorrhage type    1. Chronic systolic heart failure (HCC) Patient has significant decrease an EF of 25 to 30%.  He has been tried on several guideline directed medical therapy medications including lisinopril, Entresto and apparently was unable to tolerate these medications.  She continues to have significant lower extremity edema at 2+ and from feet to upper ankles relieved with Lasix as needed.  Her relative who is with her today states he has given up to 120 mg of  Lasix in 1 day to help relieve the lower extremity edema.  We will increase the carvedilol dose to 6.25 mg p.o. twice daily.  2. CAD in native artery Last cardiac catheterization November 20, 2018 showed severe three-vessel CAD including CTO of mid LAD and mid LCx and proximal RCA.  Widely patent LIMA to LAD.  Patent SVG to distal RCA with up to 50% stenosis in mid graft.  Bifurcation disease involving the distal RCA similar to prior cath with 90% lesions involving the ostial/proximal RPDA and ostial/proximal RCA continuation.  Patient's relative, who is with her, states she is unable to tolerate any activity without becoming tachycardic and short of breath.  She has apparently been unable to tolerate Imdur in the past.  She is taking Isorbid dinitrate 5 mg p.o. twice daily.  Continue aspirin 81 mg daily and Plavix 75 mg daily.  3. Essential hypertension Patient has a blood pressure 124/82 today.  Her heart rate was initially 115 on arrival.  4. Tobacco abuse disorder She has significant lung disease with smoking at least 1 pack/day at 50 years duration.  She continues to smoke up to a half a pack per day but he smoked 1 cigarette yesterday and approximately 7 cigarettes before noon today.  Patient states her heart  rate increases when she has not smoked in a while and in order to relieve the increased heart rate she smokes a cigarette.  She does have as needed Xanax prescribed 0.25 mg as needed for anxiety.  Encouraged her to take this more on a schedule such as twice a day to help with anxiety and withdrawal from nicotine symptoms.  Encouraged her and relative to ask PCP about NicoDerm patch to possibly suppress the urge to smoke.  5. Gastrointestinal hemorrhage, unspecified gastrointestinal hemorrhage type She has had no further evidence of GI bleeding.  She is taking ferrous sulfate for anemia.   6.  Tachycardia/irregular heart rate Patient recently saw  cardiologist for [facility] a few weeks ago who placed her on a cardiac monitor.  We do not have access to those results.  We will attempt to obtain the results of that monitor.  Her heart rate is irregularly irregular today and tachycardic had a initial rate of 115 on arrival.  EKG shows sinus rhythm with premature atrial complexes with aberrant conduction, biatrial enlargement, ST and T wave abnormality, consider lateral ischemia, prolonged QT rate of 91 QT/QTc 414/509 ms.  Medication Adjustments/Labs and Tests Ordered: Current medicines are reviewed at length with the patient today.  Concerns regarding medicines are outlined above.   Disposition: Follow-up with Dr. Purvis Sheffield or APP 1 month  Signed, Rennis Harding, NP 05/14/2019 2:21 PM    [facility] at [facility] [address] [place], [place], [place] [address] Phone: [phone]; Fax: [phone]

## 2019-05-15 NOTE — Addendum Note (Signed)
Addended by: Eustace Moore on: 05/15/2019 11:38 AM   Modules accepted: Orders

## 2019-05-18 ENCOUNTER — Inpatient Hospital Stay (HOSPITAL_COMMUNITY)
Admission: EM | Admit: 2019-05-18 | Discharge: 2019-05-23 | DRG: 280 | Disposition: A | Discharge status: Hospice | Payer: Medicare PPO | Attending: Family Medicine | Admitting: Family Medicine

## 2019-05-18 ENCOUNTER — Other Ambulatory Visit: Payer: Self-pay

## 2019-05-18 ENCOUNTER — Emergency Department (HOSPITAL_COMMUNITY): Payer: Medicare PPO

## 2019-05-18 ENCOUNTER — Encounter (HOSPITAL_COMMUNITY): Payer: Self-pay | Admitting: *Deleted

## 2019-05-18 DIAGNOSIS — I214 Non-ST elevation (NSTEMI) myocardial infarction: Secondary | ICD-10-CM | POA: Diagnosis not present

## 2019-05-18 DIAGNOSIS — R64 Cachexia: Secondary | ICD-10-CM | POA: Diagnosis present

## 2019-05-18 DIAGNOSIS — E785 Hyperlipidemia, unspecified: Secondary | ICD-10-CM | POA: Diagnosis present

## 2019-05-18 DIAGNOSIS — Z951 Presence of aortocoronary bypass graft: Secondary | ICD-10-CM

## 2019-05-18 DIAGNOSIS — Z20822 Contact with and (suspected) exposure to covid-19: Secondary | ICD-10-CM | POA: Diagnosis not present

## 2019-05-18 DIAGNOSIS — D62 Acute posthemorrhagic anemia: Secondary | ICD-10-CM | POA: Diagnosis present

## 2019-05-18 DIAGNOSIS — I2510 Atherosclerotic heart disease of native coronary artery without angina pectoris: Secondary | ICD-10-CM | POA: Diagnosis present

## 2019-05-18 DIAGNOSIS — I4891 Unspecified atrial fibrillation: Secondary | ICD-10-CM | POA: Diagnosis present

## 2019-05-18 DIAGNOSIS — Z66 Do not resuscitate: Secondary | ICD-10-CM | POA: Diagnosis not present

## 2019-05-18 DIAGNOSIS — K921 Melena: Secondary | ICD-10-CM | POA: Diagnosis present

## 2019-05-18 DIAGNOSIS — G4700 Insomnia, unspecified: Secondary | ICD-10-CM | POA: Diagnosis present

## 2019-05-18 DIAGNOSIS — I272 Pulmonary hypertension, unspecified: Secondary | ICD-10-CM | POA: Diagnosis present

## 2019-05-18 DIAGNOSIS — J9811 Atelectasis: Secondary | ICD-10-CM | POA: Diagnosis present

## 2019-05-18 DIAGNOSIS — I959 Hypotension, unspecified: Secondary | ICD-10-CM | POA: Diagnosis not present

## 2019-05-18 DIAGNOSIS — F0390 Unspecified dementia without behavioral disturbance: Secondary | ICD-10-CM | POA: Diagnosis present

## 2019-05-18 DIAGNOSIS — Z515 Encounter for palliative care: Secondary | ICD-10-CM | POA: Diagnosis not present

## 2019-05-18 DIAGNOSIS — E871 Hypo-osmolality and hyponatremia: Secondary | ICD-10-CM | POA: Diagnosis not present

## 2019-05-18 DIAGNOSIS — I130 Hypertensive heart and chronic kidney disease with heart failure and stage 1 through stage 4 chronic kidney disease, or unspecified chronic kidney disease: Principal | ICD-10-CM | POA: Diagnosis present

## 2019-05-18 DIAGNOSIS — R0602 Shortness of breath: Secondary | ICD-10-CM | POA: Diagnosis not present

## 2019-05-18 DIAGNOSIS — I5020 Unspecified systolic (congestive) heart failure: Secondary | ICD-10-CM

## 2019-05-18 DIAGNOSIS — Z7982 Long term (current) use of aspirin: Secondary | ICD-10-CM

## 2019-05-18 DIAGNOSIS — Z681 Body mass index (BMI) 19 or less, adult: Secondary | ICD-10-CM

## 2019-05-18 DIAGNOSIS — I509 Heart failure, unspecified: Secondary | ICD-10-CM | POA: Diagnosis not present

## 2019-05-18 DIAGNOSIS — I110 Hypertensive heart disease with heart failure: Secondary | ICD-10-CM | POA: Diagnosis not present

## 2019-05-18 DIAGNOSIS — F17210 Nicotine dependence, cigarettes, uncomplicated: Secondary | ICD-10-CM | POA: Diagnosis present

## 2019-05-18 DIAGNOSIS — Z79899 Other long term (current) drug therapy: Secondary | ICD-10-CM

## 2019-05-18 DIAGNOSIS — I10 Essential (primary) hypertension: Secondary | ICD-10-CM | POA: Diagnosis present

## 2019-05-18 DIAGNOSIS — I255 Ischemic cardiomyopathy: Secondary | ICD-10-CM | POA: Diagnosis present

## 2019-05-18 DIAGNOSIS — J439 Emphysema, unspecified: Secondary | ICD-10-CM | POA: Diagnosis present

## 2019-05-18 DIAGNOSIS — E440 Moderate protein-calorie malnutrition: Secondary | ICD-10-CM | POA: Diagnosis not present

## 2019-05-18 DIAGNOSIS — Z7189 Other specified counseling: Secondary | ICD-10-CM | POA: Diagnosis not present

## 2019-05-18 DIAGNOSIS — F410 Panic disorder [episodic paroxysmal anxiety] without agoraphobia: Secondary | ICD-10-CM | POA: Diagnosis present

## 2019-05-18 DIAGNOSIS — I5043 Acute on chronic combined systolic (congestive) and diastolic (congestive) heart failure: Secondary | ICD-10-CM | POA: Diagnosis present

## 2019-05-18 DIAGNOSIS — Z8249 Family history of ischemic heart disease and other diseases of the circulatory system: Secondary | ICD-10-CM

## 2019-05-18 DIAGNOSIS — I4892 Unspecified atrial flutter: Secondary | ICD-10-CM | POA: Diagnosis not present

## 2019-05-18 DIAGNOSIS — Z7902 Long term (current) use of antithrombotics/antiplatelets: Secondary | ICD-10-CM

## 2019-05-18 DIAGNOSIS — K922 Gastrointestinal hemorrhage, unspecified: Secondary | ICD-10-CM | POA: Diagnosis not present

## 2019-05-18 DIAGNOSIS — R627 Adult failure to thrive: Secondary | ICD-10-CM | POA: Diagnosis not present

## 2019-05-18 DIAGNOSIS — D509 Iron deficiency anemia, unspecified: Secondary | ICD-10-CM | POA: Diagnosis present

## 2019-05-18 DIAGNOSIS — N1831 Chronic kidney disease, stage 3a: Secondary | ICD-10-CM | POA: Diagnosis present

## 2019-05-18 LAB — BRAIN NATRIURETIC PEPTIDE: B Natriuretic Peptide: >4500 pg/mL — ABNORMAL HIGH (ref 0.0–100.0)

## 2019-05-18 LAB — COMPREHENSIVE METABOLIC PANEL
ALT: 34 U/L (ref 0–44)
AST: 37 U/L (ref 15–41)
Albumin: 3.1 g/dL — ABNORMAL LOW (ref 3.5–5.0)
Alkaline Phosphatase: 80 U/L (ref 38–126)
Anion gap: 12 (ref 5–15)
BUN: 20 mg/dL (ref 8–23)
CO2: 27 mmol/L (ref 22–32)
Calcium: 8.6 mg/dL — ABNORMAL LOW (ref 8.9–10.3)
Chloride: 85 mmol/L — ABNORMAL LOW (ref 98–111)
Creatinine, Ser: 1.16 mg/dL — ABNORMAL HIGH (ref 0.44–1.00)
GFR calc Af Amer: 53 mL/min — ABNORMAL LOW (ref >60)
GFR calc non Af Amer: 46 mL/min — ABNORMAL LOW (ref >60)
Glucose, Bld: 126 mg/dL — ABNORMAL HIGH (ref 70–99)
Potassium: 4.5 mmol/L (ref 3.5–5.1)
Sodium: 124 mmol/L — ABNORMAL LOW (ref 135–145)
Total Bilirubin: 1 mg/dL (ref 0.3–1.2)
Total Protein: 6.2 g/dL — ABNORMAL LOW (ref 6.5–8.1)

## 2019-05-18 LAB — CBC WITH DIFFERENTIAL/PLATELET
Abs Immature Granulocytes: 0.04 10*3/uL (ref 0.00–0.07)
Basophils Absolute: 0 10*3/uL (ref 0.0–0.1)
Basophils Relative: 0 %
Eosinophils Absolute: 0.1 10*3/uL (ref 0.0–0.5)
Eosinophils Relative: 1 %
HCT: 42 % (ref 36.0–46.0)
Hemoglobin: 13.9 g/dL (ref 12.0–15.0)
Immature Granulocytes: 0 %
Lymphocytes Relative: 9 %
Lymphs Abs: 1 10*3/uL (ref 0.7–4.0)
MCH: 33.9 pg (ref 26.0–34.0)
MCHC: 33.1 g/dL (ref 30.0–36.0)
MCV: 102.4 fL — ABNORMAL HIGH (ref 80.0–100.0)
Monocytes Absolute: 0.7 10*3/uL (ref 0.1–1.0)
Monocytes Relative: 6 %
Neutro Abs: 9.2 10*3/uL — ABNORMAL HIGH (ref 1.7–7.7)
Neutrophils Relative %: 84 %
Platelets: 267 10*3/uL (ref 150–400)
RBC: 4.1 MIL/uL (ref 3.87–5.11)
RDW: 13.7 % (ref 11.5–15.5)
WBC: 11 10*3/uL — ABNORMAL HIGH (ref 4.0–10.5)
nRBC: 0 % (ref 0.0–0.2)

## 2019-05-18 LAB — D-DIMER, QUANTITATIVE: D-Dimer, Quant: 1.69 ug/mL-FEU — ABNORMAL HIGH (ref 0.00–0.50)

## 2019-05-18 LAB — RESPIRATORY PANEL BY RT PCR (FLU A&B, COVID)
Influenza A by PCR: NEGATIVE
Influenza B by PCR: NEGATIVE
SARS Coronavirus 2 by RT PCR: NEGATIVE

## 2019-05-18 LAB — PROCALCITONIN: Procalcitonin: <0.1 ng/mL

## 2019-05-18 MED ORDER — SODIUM CHLORIDE 0.9 % IV SOLN
250.0000 mL | INTRAVENOUS | Status: DC | PRN
  Administered 2019-05-21: 250 mL via INTRAVENOUS

## 2019-05-18 MED ORDER — ENSURE ENLIVE PO LIQD
237.0000 mL | Freq: Two times a day (BID) | ORAL | Status: DC
  Administered 2019-05-19 – 2019-05-20 (×4): 237 mL via ORAL

## 2019-05-18 MED ORDER — FUROSEMIDE 10 MG/ML IJ SOLN
40.0000 mg | Freq: Once | INTRAMUSCULAR | Status: AC
  Administered 2019-05-18: 18:00:00 40 mg via INTRAVENOUS
  Filled 2019-05-18: qty 4

## 2019-05-18 MED ORDER — ASPIRIN EC 81 MG PO TBEC
81.0000 mg | DELAYED_RELEASE_TABLET | Freq: Every day | ORAL | Status: DC
  Administered 2019-05-19 – 2019-05-20 (×2): 81 mg via ORAL
  Filled 2019-05-18 (×2): qty 1

## 2019-05-18 MED ORDER — ISOSORBIDE DINITRATE 10 MG PO TABS
5.0000 mg | ORAL_TABLET | Freq: Two times a day (BID) | ORAL | Status: DC
  Administered 2019-05-18 – 2019-05-20 (×5): 5 mg via ORAL
  Filled 2019-05-18 (×11): qty 1

## 2019-05-18 MED ORDER — POTASSIUM CHLORIDE CRYS ER 20 MEQ PO TBCR
40.0000 meq | EXTENDED_RELEASE_TABLET | Freq: Two times a day (BID) | ORAL | Status: DC
  Administered 2019-05-18 – 2019-05-19 (×3): 40 meq via ORAL
  Filled 2019-05-18 (×4): qty 2

## 2019-05-18 MED ORDER — ROSUVASTATIN CALCIUM 5 MG PO TABS
5.0000 mg | ORAL_TABLET | Freq: Every day | ORAL | Status: DC
  Administered 2019-05-18 – 2019-05-20 (×3): 5 mg via ORAL
  Filled 2019-05-18 (×4): qty 1

## 2019-05-18 MED ORDER — ALBUTEROL SULFATE HFA 108 (90 BASE) MCG/ACT IN AERS: 1.0000 | INHALATION_SPRAY | Freq: Four times a day (QID) | RESPIRATORY_TRACT | Status: DC | PRN

## 2019-05-18 MED ORDER — ALPRAZOLAM 0.25 MG PO TABS
0.2500 mg | ORAL_TABLET | Freq: Every evening | ORAL | Status: DC | PRN
  Administered 2019-05-20: 0.25 mg via ORAL
  Filled 2019-05-18: qty 1

## 2019-05-18 MED ORDER — FUROSEMIDE 10 MG/ML IJ SOLN
60.0000 mg | Freq: Two times a day (BID) | INTRAMUSCULAR | Status: DC
  Administered 2019-05-19 – 2019-05-20 (×3): 60 mg via INTRAVENOUS
  Filled 2019-05-18 (×4): qty 6

## 2019-05-18 MED ORDER — ALBUTEROL SULFATE (2.5 MG/3ML) 0.083% IN NEBU: 2.5000 mg | INHALATION_SOLUTION | Freq: Four times a day (QID) | RESPIRATORY_TRACT | Status: DC | PRN

## 2019-05-18 MED ORDER — FUROSEMIDE 10 MG/ML IJ SOLN
40.0000 mg | Freq: Once | INTRAMUSCULAR | Status: AC
  Administered 2019-05-18: 16:00:00 40 mg via INTRAVENOUS
  Filled 2019-05-18: qty 4

## 2019-05-18 MED ORDER — SODIUM CHLORIDE 0.9% FLUSH
3.0000 mL | INTRAVENOUS | Status: DC | PRN
  Administered 2019-05-19: 3 mL via INTRAVENOUS

## 2019-05-18 MED ORDER — CARVEDILOL 3.125 MG PO TABS
6.2500 mg | ORAL_TABLET | Freq: Two times a day (BID) | ORAL | Status: DC
  Administered 2019-05-18 – 2019-05-20 (×5): 6.25 mg via ORAL
  Filled 2019-05-18 (×5): qty 2

## 2019-05-18 MED ORDER — CLOPIDOGREL BISULFATE 75 MG PO TABS
75.0000 mg | ORAL_TABLET | Freq: Every day | ORAL | Status: DC
  Administered 2019-05-19 – 2019-05-20 (×2): 75 mg via ORAL
  Filled 2019-05-18 (×2): qty 1

## 2019-05-18 MED ORDER — HYDRALAZINE HCL 25 MG PO TABS
25.0000 mg | ORAL_TABLET | Freq: Three times a day (TID) | ORAL | Status: DC
  Administered 2019-05-18 – 2019-05-20 (×5): 25 mg via ORAL
  Filled 2019-05-18 (×5): qty 1

## 2019-05-18 MED ORDER — ONDANSETRON HCL 4 MG/2ML IJ SOLN: 4.0000 mg | Freq: Four times a day (QID) | INTRAMUSCULAR | Status: DC | PRN

## 2019-05-18 MED ORDER — ACETAMINOPHEN 325 MG PO TABS
650.0000 mg | ORAL_TABLET | ORAL | Status: DC | PRN
  Administered 2019-05-19 – 2019-05-22 (×3): 650 mg via ORAL
  Filled 2019-05-18 (×3): qty 2

## 2019-05-18 MED ORDER — SODIUM CHLORIDE 0.9% FLUSH
3.0000 mL | Freq: Two times a day (BID) | INTRAVENOUS | Status: DC
  Administered 2019-05-18 – 2019-05-23 (×10): 3 mL via INTRAVENOUS

## 2019-05-18 MED ORDER — ENOXAPARIN SODIUM 300 MG/3ML IJ SOLN
20.0000 mg | INTRAMUSCULAR | Status: DC
  Administered 2019-05-19: 20 mg via SUBCUTANEOUS
  Filled 2019-05-18 (×3): qty 0.2

## 2019-05-18 MED ORDER — ENOXAPARIN SODIUM 30 MG/0.3ML ~~LOC~~ SOLN: 30.0000 mg | SUBCUTANEOUS | Status: DC

## 2019-05-18 NOTE — ED Triage Notes (Signed)
C/o shortness of breath onset this am

## 2019-05-18 NOTE — ED Provider Notes (Signed)
[facility] EMERGENCY DEPARTMENT Provider Note   CSN: [id number] Arrival date & time: 05/18/19  1302     History Chief Complaint  Patient presents with  . Shortness of Breath    Susan [name] is a 74 y.o. female.  Patient complains of shortness of breath.  Patient has a history of congestive heart failure and has swelling in her legs  The history is provided by the patient. No language interpreter was used.  Shortness of Breath Severity:  Moderate Onset quality:  Sudden Timing:  Constant Progression:  Worsening Chronicity:  New Context: activity   Relieved by:  Nothing Worsened by:  Nothing Ineffective treatments:  None tried Associated symptoms: no abdominal pain, no chest pain, no cough, no headaches and no rash        Past Medical History:  Diagnosis Date  . Acute on chronic systolic and diastolic heart failure, NYHA class 1 (HCC) 11/21/2018  . Anxiety   . CAD (coronary artery disease)    a. s/p CABG x5 in 2000 b. cath in 02/2017 showing 2/5 patent grafts with patent LIMA-LAD, patent SVG-distal RCA, occluded SVG-D1 and occluded seq-SVG-OM1-OM2 with medical management recommended and possible PCI of distal RCA branches in the future if recurrent anginal symptoms  . CHF (congestive heart failure) (HCC)    a. EF 20-25% by echo in 12/2016 b. EF 45-50% by echo in 05/2017 c. EF 30% by echo in 11/2018  . Chronic bronchitis (HCC)   . CKD (chronic kidney disease) stage 3, GFR 30-59 ml/min 11/21/2018  . COPD (chronic obstructive pulmonary disease) (HCC)   . Dizziness   . Esophageal reflux   . Essential hypertension   . Fatigue   . HLD (hyperlipidemia) 11/21/2018  . HTN (hypertension) 11/21/2018  . Hypercholesteremia   . Hyperkalemia   . Ovarian failure   . SOB (shortness of breath)     Patient Active Problem List   Diagnosis Date Noted  . Acute exacerbation of CHF (congestive heart failure) (HCC) 05/03/2019  . CHF exacerbation (HCC) 05/02/2019  . Acute gastritis  with hemorrhage   . Acute gastric ulcer with hemorrhage   . Acute upper GI bleed 01/19/2019  . Symptomatic anemia   . ST segment depression   . Elevated troponin   . Fracture, Colles, left, closed   . Fracture   . GI bleed 01/18/2019  . Acute on chronic systolic and diastolic heart failure, NYHA class 1 (HCC) 11/21/2018  . CKD (chronic kidney disease) stage 3, GFR 30-59 ml/min 11/21/2018  . CAD in native artery 11/21/2018  . HTN (hypertension) 11/21/2018  . HLD (hyperlipidemia) 11/21/2018  . SEMI (subendocardial myocardial infarction) (HCC) 11/19/2018  . Hx of CABG 02/18/2017  . Diastolic dysfunction 02/18/2017  . Pulmonary hypertension (HCC) 02/18/2017  . Smoker 02/18/2017  . Renal insufficiency 02/18/2017  . Dyslipidemia 02/18/2017  . Ischemic cardiomyopathy     Past Surgical History:  Procedure Laterality Date  . ABDOMINAL HYSTERECTOMY    . APPENDECTOMY    . BIOPSY  01/22/2019   Procedure: BIOPSY;  Surgeon: Beverley Fiedler, MD;  Location: [facility] ENDOSCOPY;  Service: Gastroenterology;;  . BREAST BIOPSY Right   . CARDIAC CATHETERIZATION  2000  . CORONARY ARTERY BYPASS GRAFT  2000   CABG X5  . ESOPHAGOGASTRODUODENOSCOPY (EGD) WITH PROPOFOL N/A 01/22/2019   Procedure: ESOPHAGOGASTRODUODENOSCOPY (EGD) WITH PROPOFOL;  Surgeon: Beverley Fiedler, MD;  Location: [facility] ENDOSCOPY;  Service: Gastroenterology;  Laterality: N/A;  . LEFT HEART CATH AND CORS/GRAFTS ANGIOGRAPHY N/A 02/17/2017  Procedure: LEFT HEART CATH AND CORS/GRAFTS ANGIOGRAPHY;  Surgeon: Kathleene Hazel, MD;  Location: [facility] INVASIVE CV LAB;  Service: Cardiovascular;  Laterality: N/A;  . LEFT HEART CATH AND CORS/GRAFTS ANGIOGRAPHY N/A 11/20/2018   Procedure: LEFT HEART CATH AND CORS/GRAFTS ANGIOGRAPHY;  Surgeon: Yvonne Kendall, MD;  Location: [facility] INVASIVE CV LAB;  Service: Cardiovascular;  Laterality: N/A;     OB History   No obstetric history on file.     Family History  Problem Relation Age of Onset  . Heart attack  Mother   . Hypertension Father     Social History   Tobacco Use  . Smoking status: Current Every Day Smoker    Packs/day: 1.00    Years: 50.00    Pack years: 50.00    Types: Cigarettes    Last attempt to quit: 02/22/2017    Years since quitting: 2.2  . Smokeless tobacco: Never Used  . Tobacco comment: 1/2 pack per day 05/14/19  Substance Use Topics  . Alcohol use: No  . Drug use: No    Home Medications Prior to Admission medications   Medication Sig Start Date End Date Taking? Authorizing Provider  acetaminophen (TYLENOL) 325 MG tablet Take 2 tablets (650 mg total) by mouth every 6 (six) hours as needed for mild pain, fever or headache. 05/04/19  Yes Emokpae, Courage, MD  albuterol (VENTOLIN HFA) 108 (90 Base) MCG/ACT inhaler Inhale 1-2 puffs into the lungs every 6 (six) hours as needed for wheezing or shortness of breath. 05/04/19  Yes Emokpae, Courage, MD  ALPRAZolam Prudy Feeler) 0.25 MG tablet Take 1 tablet (0.25 mg total) by mouth at bedtime as needed for anxiety. 05/04/19 05/03/20 Yes Shon Hale, MD  aspirin EC 81 MG tablet Take 1 tablet (81 mg total) by mouth daily with breakfast. 05/04/19  Yes Emokpae, Courage, MD  carvedilol (COREG) 6.25 MG tablet Take 1 tablet (6.25 mg total) by mouth 2 (two) times daily. 05/14/19  Yes Netta Neat., NP  clopidogrel (PLAVIX) 75 MG tablet Take 1 tablet (75 mg total) by mouth daily. 05/04/19  Yes Shon Hale, MD  ferrous sulfate 325 (65 FE) MG tablet Take 1 tablet (325 mg total) by mouth 2 (two) times daily with a meal. 01/24/19  Yes Arnetha Courser, MD  furosemide (LASIX) 40 MG tablet Take 1 tablet (40 mg total) by mouth daily. (may take extra as needed for weight gain of 3 pounds in 1 day or 5 pounds in 1 week) 12/11/18  Yes Laqueta Linden, MD  hydrALAZINE (APRESOLINE) 25 MG tablet Take 1 tablet (25 mg total) by mouth 3 (three) times daily. 05/04/19  Yes Emokpae, Courage, MD  isosorbide dinitrate (ISORDIL) 5 MG tablet Take 5 mg by mouth 2  (two) times daily.   Yes [provider]  Multiple Vitamin (MULTIVITAMIN) tablet Take 1 tablet by mouth daily.   Yes [provider]  nitroGLYCERIN (NITROSTAT) 0.4 MG SL tablet Place 1 tablet (0.4 mg total) under the tongue every 5 (five) minutes x 3 doses as needed for chest pain. 02/18/17  Yes Kilroy, Luke K, PA-C  potassium chloride SA (KLOR-CON M20) 20 MEQ tablet Take 1 tablet (20 mEq total) by mouth daily. (may take one extra tab when taking extra Lasix) 05/04/19  Yes Emokpae, Courage, MD  rosuvastatin (CRESTOR) 5 MG tablet Take 5 mg by mouth daily. 12/22/18  Yes [provider]  vitamin B-12 (CYANOCOBALAMIN) 250 MCG tablet Take 250 mcg by mouth daily.   Yes [provider]    Allergies    Patient has no known allergies.  Review of Systems   Review of Systems  Constitutional: Negative for appetite change and fatigue.  HENT: Negative for congestion, ear discharge and sinus pressure.   Eyes: Negative for discharge.  Respiratory: Positive for shortness of breath. Negative for cough.   Cardiovascular: Negative for chest pain.  Gastrointestinal: Negative for abdominal pain and diarrhea.  Genitourinary: Negative for frequency and hematuria.  Musculoskeletal: Negative for back pain.       Swelling in legs  Skin: Negative for rash.  Neurological: Negative for seizures and headaches.  Psychiatric/Behavioral: Negative for hallucinations.    Physical Exam Updated Vital Signs BP (!) 151/102   Pulse 85   Temp 97.8 F (36.6 C)   Resp (!) 25   SpO2 96%   Physical Exam Vitals and nursing note reviewed.  Constitutional:      Appearance: She is well-developed.  HENT:     Head: Normocephalic.     Nose: Nose normal.  Eyes:     General: No scleral icterus.    Conjunctiva/sclera: Conjunctivae normal.  Neck:     Thyroid: No thyromegaly.  Cardiovascular:     Rate and Rhythm: Normal rate and regular rhythm.     Heart sounds: No murmur. No friction rub.  No gallop.   Pulmonary:     Breath sounds: No stridor. No wheezing or rales.  Chest:     Chest wall: No tenderness.  Abdominal:     General: There is no distension.     Tenderness: There is no abdominal tenderness. There is no rebound.  Musculoskeletal:        General: Normal range of motion.     Cervical back: Neck supple.     Comments: Edema in legs  Lymphadenopathy:     Cervical: No cervical adenopathy.  Skin:    Findings: No erythema or rash.  Neurological:     Mental Status: She is oriented to person, place, and time.     Motor: No abnormal muscle tone.     Coordination: Coordination normal.  Psychiatric:        Behavior: Behavior normal.     ED Results / Procedures / Treatments   Labs (all labs ordered are listed, but only abnormal results are displayed) Labs Reviewed  CBC WITH DIFFERENTIAL/PLATELET - Abnormal; Notable for the following components:      Result Value   WBC 11.0 (*)    MCV 102.4 (*)    Neutro Abs 9.2 (*)    All other components within normal limits  COMPREHENSIVE METABOLIC PANEL - Abnormal; Notable for the following components:   Sodium 124 (*)    Chloride 85 (*)    Glucose, Bld 126 (*)    Creatinine, Ser 1.16 (*)    Calcium 8.6 (*)    Total Protein 6.2 (*)    Albumin 3.1 (*)    GFR calc non Af Amer 46 (*)    GFR calc Af Amer 53 (*)    All other components within normal limits  D-DIMER, QUANTITATIVE (NOT AT [facility]) - Abnormal; Notable for the following components:   D-Dimer, Quant 1.69 (*)    All other components within normal limits  BRAIN NATRIURETIC PEPTIDE - Abnormal; Notable for the following components:   B Natriuretic Peptide >4,500.0 (*)    All other components within normal limits    EKG EKG Interpretation  Date/Time:  Friday May 18 2019 13:20:51 EDT Ventricular Rate:  79 PR Interval:    QRS Duration: 95 QT Interval:  486 QTC Calculation: 558 R Axis:   5 Text Interpretation: Sinus rhythm Multiple premature complexes, vent &  supraven Probable left atrial enlargement LVH with secondary repolarization abnormality Prolonged QT interval Since last tracing rate slower Confirmed by Noemi Chapel [phone]) on 05/18/2019 3:39:28 PM   Radiology DG Chest Port 1 View  Result Date: 05/18/2019 CLINICAL DATA:  Shortness of breath. EXAM: PORTABLE CHEST 1 VIEW COMPARISON:  05/02/2018 FINDINGS: Prior median sternotomy and scratched it prior CABG. Stable severe cardiomegaly. Prominent right lower lobe atelectasis/infiltrate. Small right pleural effusion. No pneumothorax. Degenerative change thoracic spine. IMPRESSION: 1.  Prior CABG.  Stable severe cardiomegaly. 2. Prominent right lower lobe atelectasis/infiltrate. Small right pleural effusion. Electronically Signed   By: Marcello Moores  Register   On: 05/18/2019 14:11    Procedures Procedures (including critical care time)  Medications Ordered in ED Medications  furosemide (LASIX) injection 40 mg (40 mg Intravenous Given 05/18/19 1548)    ED Course  I have reviewed the triage vital signs and the nursing notes.  Pertinent labs & imaging results that were available during my care of the patient were reviewed by me and considered in my medical decision making (see chart for details). CRITICAL CARE Performed by: Milton Ferguson Total critical care time:45minutes Critical care time was exclusive of separately billable procedures and treating other patients. Critical care was necessary to treat or prevent imminent or life-threatening deterioration. Critical care was time spent personally by me on the following activities: development of treatment plan with patient and/or surrogate as well as nursing, discussions with consultants, evaluation of patient's response to treatment, examination of patient, obtaining history from patient or surrogate, ordering and performing treatments and interventions, ordering and review of laboratory studies, ordering and review of radiographic studies, pulse oximetry  and re-evaluation of patient's condition.    MDM Rules/Calculators/A&P                      Patient with congestive heart failure she will be admitted for diuresis.  Patient is also hyponatremic      This patient presents to the ED for concern of shortness of breath, this involves an extensive number of treatment options, and is a complaint that carries with it a high risk of complications and morbidity.  The differential diagnosis includes heart failure pneumonia   Lab Tests:  I Ordered, reviewed, and interpreted labs, which included CBC chemistries BNP which showed hyponatremia and severely elevated BNP Medicines ordered:   I ordered medication Lasix for diuresis  Imaging Studies ordered:   I ordered imaging studies which included chest x-ray and  I independently visualized and interpreted imaging which showed possible infiltrate  Additional history obtained:   Additional history obtained from family member  Previous records obtained and reviewed  Consultations Obtained:   I consulted hospitalist and discussed lab and imaging findings  Reevaluation:  After the interventions stated above, I reevaluated the patient and found no change  Critical Interventions:  .   Final Clinical Impression(s) / ED Diagnoses Final diagnoses:  Systolic congestive heart failure, unspecified HF chronicity ([place])    Rx / DC Orders ED Discharge Orders    None       Milton Ferguson, MD 05/18/19 1552

## 2019-05-18 NOTE — TOC Initial Note (Signed)
Transition of Care [facility]) - Initial/Assessment Note   Patient Details  Name: Susan [name] MRN: [id number] Date of Birth: 1945/11/05  Transition of [facility]) CM/SW Contact:    Sherie Don, LCSW Phone Number: 05/18/2019, 7:05 PM  Clinical Narrative: Patient is a 74 year old female who was admitted for acute on chronic CHF. TOC received consult for CHF screening. CSW met with patient and her husband, Hoang Reich, in ED to complete assessment. Patient lives in a single family home with her husband. Patient reported her only DME is a shower chair. Patient and husband expressed need for walker to help patient safely ambulate. Patient does not have a history of HH and was unsure if she would need it upon discharge.  Patient and husband reported she eats a heart healthy diet and does not eat salt. Patient stated she does not currently restrict her fluid intake, but did start weighing herself daily about 2-3 weeks following her recent hospital visit. Patient's husband reported patient's weight went up by 2 pounds overnight, so they brought her to the ED.  CSW made referral to [facility] with [employer] for a walker, which was accepted. CSW provided patient with walker and patient signed delivery ticket.          Expected Discharge Plan: Home/Self Care Barriers to Discharge: Continued Medical Work up  Patient Goals and CMS Choice Patient states their goals for this hospitalization and ongoing recovery are:: Discharge home CMS Medicare.gov Compare Post Acute Care list provided to:: Patient Choice offered to / list presented to : Patient  Expected Discharge Plan and Services Expected Discharge Plan: Home/Self Care Post Acute Care Choice: Durable Medical Equipment Living arrangements for the past 2 months: Single Family Home           DME Arranged: Walker rolling DME Agency: [employer] Date DME Agency Contacted: 05/18/19 Time DME Agency Contacted: [phone] Representative spoke with at DME Agency: Blake Divine  Prior Living Arrangements/Services Living arrangements for the past 2 months: [place] with:: Spouse(Dennis Skarzynski (husband)) Patient language and need for interpreter reviewed:: Yes Do you feel safe going back to the place where you live?: Yes      Need for Family Participation in Patient Care: No (Comment) Care giver support system in place?: Yes (comment)(Dennis Reames (husband))   Criminal Activity/Legal Involvement Pertinent to Current Situation/Hospitalization: No - Comment as needed  Emotional Assessment Appearance:: Appears stated age Attitude/Demeanor/Rapport: Engaged Affect (typically observed): Accepting Orientation: : Oriented to Self, Oriented to Place, Oriented to  Time, Oriented to Situation Alcohol / Substance Use: Tobacco Use Psych Involvement: No (comment)  Admission diagnosis:  Acute on chronic heart failure (HCC) [N35.6] Systolic congestive heart failure, unspecified HF chronicity ([place]) [I50.20] Patient Active Problem List   Diagnosis Date Noted  . Acute on chronic heart failure ([place]) 05/18/2019  . Failure to thrive in adult 05/18/2019  . Hyponatremia 05/18/2019  . Acute exacerbation of CHF (congestive heart failure) ([place]) 05/03/2019  . CHF exacerbation ([place]) 05/02/2019  . Acute gastritis with hemorrhage   . Acute gastric ulcer with hemorrhage   . Acute upper GI bleed 01/19/2019  . Symptomatic anemia   . ST segment depression   . Elevated troponin   . Fracture, Colles, left, closed   . Fracture   . GI bleed 01/18/2019  . Acute on chronic systolic and diastolic heart failure, NYHA class 1 ([place]) 11/21/2018  . CKD (chronic kidney disease) stage 3, GFR 30-59 ml/min 11/21/2018  . CAD in  native artery 11/21/2018  . HTN (hypertension) 11/21/2018  . HLD (hyperlipidemia) 11/21/2018  . SEMI (subendocardial myocardial infarction) ([place]) 11/19/2018  . Hx of CABG 02/18/2017  . Diastolic dysfunction 53/64/6803  . Pulmonary hypertension ([place])  02/18/2017  . Smoker 02/18/2017  . Renal insufficiency 02/18/2017  . Dyslipidemia 02/18/2017  . Ischemic cardiomyopathy    PCP:  [name], MD Pharmacy:   [id number] [address] [id number]: [phone]: [address] [place] [address]Phone: [phone]: [phone] Readmission Risk Interventions No flowsheet data found.

## 2019-05-18 NOTE — H&P (Addendum)
History and Physical  Susan [name] JJK:[id number] DOB: 1945-02-08 DOA: 05/18/2019  Referring physician: Dr Aileen Pilot, ED physician PCP: [name], MD  Outpatient Specialists: Purvis Sheffield, MD (Cards)  Patient Coming From: home  Chief Complaint: SOB, difficulty walking  HPI: Susan [name] is a 74 y.o. female with a history of chronic systolic and diastolic heart failure with EF of 20-25%, ischemic cardiomyopathy, anxiety, hypertension, hyperlipidemia.  Patient was recently hospitalized last week for shortness of breath.  She was admitted and diuresed some and then discharged from the hospital on 4/23.  She followed up with her cardiologist, who increased her carvedilol and her Lasix.  Over the past few days, she has taken 3 of her 40 mg tablets.  She is remained short of breath with increasing edema in her legs.  No palliating or provoking factors.    She has been having a cough with clear sputum production  Emergency Department Course: BNP greater than 4500.  Creatinine 1.16, sodium 124.  White count 11.  Chest x-ray shows severe cardiomegaly with right lower lobe lobe atelectasis/infiltrate  Review of Systems:   Pt denies any fevers, chills, nausea, vomiting, diarrhea, constipation, abdominal pain, shortness of breath, palpitations, headache, vision changes, lightheadedness, dizziness, melena, rectal bleeding.  Review of systems are otherwise negative  Past Medical History:  Diagnosis Date   Acute on chronic systolic and diastolic heart failure, NYHA class 1 (HCC) 11/21/2018   Anxiety    CAD (coronary artery disease)    a. s/p CABG x5 in 2000 b. cath in 02/2017 showing 2/5 patent grafts with patent LIMA-LAD, patent SVG-distal RCA, occluded SVG-D1 and occluded seq-SVG-OM1-OM2 with medical management recommended and possible PCI of distal RCA branches in the future if recurrent anginal symptoms   CHF (congestive heart failure) (HCC)    a. EF 20-25% by echo in 12/2016 b. EF 45-50% by  echo in 05/2017 c. EF 30% by echo in 11/2018   Chronic bronchitis (HCC)    CKD (chronic kidney disease) stage 3, GFR 30-59 ml/min 11/21/2018   COPD (chronic obstructive pulmonary disease) (HCC)    Dizziness    Esophageal reflux    Essential hypertension    Fatigue    HLD (hyperlipidemia) 11/21/2018   HTN (hypertension) 11/21/2018   Hypercholesteremia    Hyperkalemia    Ovarian failure    SOB (shortness of breath)    Past Surgical History:  Procedure Laterality Date   ABDOMINAL HYSTERECTOMY     APPENDECTOMY     BIOPSY  01/22/2019   Procedure: BIOPSY;  Surgeon: Beverley Fiedler, MD;  Location: [facility];  Service: Gastroenterology;;   BREAST BIOPSY Right    CARDIAC CATHETERIZATION  2000   CORONARY ARTERY BYPASS GRAFT  2000   CABG X5   ESOPHAGOGASTRODUODENOSCOPY (EGD) WITH PROPOFOL N/A 01/22/2019   Procedure: ESOPHAGOGASTRODUODENOSCOPY (EGD) WITH PROPOFOL;  Surgeon: Beverley Fiedler, MD;  Location: [facility];  Service: Gastroenterology;  Laterality: N/A;   LEFT HEART CATH AND CORS/GRAFTS ANGIOGRAPHY N/A 02/17/2017   Procedure: LEFT HEART CATH AND CORS/GRAFTS ANGIOGRAPHY;  Surgeon: Kathleene Hazel, MD;  Location: [facility];  Service: Cardiovascular;  Laterality: N/A;   LEFT HEART CATH AND CORS/GRAFTS ANGIOGRAPHY N/A 11/20/2018   Procedure: LEFT HEART CATH AND CORS/GRAFTS ANGIOGRAPHY;  Surgeon: Yvonne Kendall, MD;  Location: [facility];  Service: Cardiovascular;  Laterality: N/A;   Social History:  reports that she has been smoking cigarettes. She has a 50.00 pack-year smoking history. She has never used smokeless tobacco.  She reports that she does not drink alcohol or use drugs. Patient lives at home  No Known Allergies  Family History  Problem Relation Age of Onset   Heart attack Mother    Hypertension Father       Prior to Admission medications   Medication Sig Start Date End Date Taking? Authorizing Provider  acetaminophen  (TYLENOL) 325 MG tablet Take 2 tablets (650 mg total) by mouth every 6 (six) hours as needed for mild pain, fever or headache. 05/04/19  Yes Emokpae, Courage, MD  albuterol (VENTOLIN HFA) 108 (90 Base) MCG/ACT inhaler Inhale 1-2 puffs into the lungs every 6 (six) hours as needed for wheezing or shortness of breath. 05/04/19  Yes Emokpae, Courage, MD  ALPRAZolam Duanne Moron) 0.25 MG tablet Take 1 tablet (0.25 mg total) by mouth at bedtime as needed for anxiety. 05/04/19 05/03/20 Yes Roxan Hockey, MD  aspirin EC 81 MG tablet Take 1 tablet (81 mg total) by mouth daily with breakfast. 05/04/19  Yes Emokpae, Courage, MD  carvedilol (COREG) 6.25 MG tablet Take 1 tablet (6.25 mg total) by mouth 2 (two) times daily. 05/14/19  Yes Verta Ellen., NP  clopidogrel (PLAVIX) 75 MG tablet Take 1 tablet (75 mg total) by mouth daily. 05/04/19  Yes Roxan Hockey, MD  ferrous sulfate 325 (65 FE) MG tablet Take 1 tablet (325 mg total) by mouth 2 (two) times daily with a meal. 01/24/19  Yes Lorella Nimrod, MD  furosemide (LASIX) 40 MG tablet Take 1 tablet (40 mg total) by mouth daily. (may take extra as needed for weight gain of 3 pounds in 1 day or 5 pounds in 1 week) 12/11/18  Yes Herminio Commons, MD  hydrALAZINE (APRESOLINE) 25 MG tablet Take 1 tablet (25 mg total) by mouth 3 (three) times daily. 05/04/19  Yes Emokpae, Courage, MD  isosorbide dinitrate (ISORDIL) 5 MG tablet Take 5 mg by mouth 2 (two) times daily.   Yes [provider]  Multiple Vitamin (MULTIVITAMIN) tablet Take 1 tablet by mouth daily.   Yes [provider]  nitroGLYCERIN (NITROSTAT) 0.4 MG SL tablet Place 1 tablet (0.4 mg total) under the tongue every 5 (five) minutes x 3 doses as needed for chest pain. 02/18/17  Yes Kilroy, Luke K, PA-C  potassium chloride SA (KLOR-CON M20) 20 MEQ tablet Take 1 tablet (20 mEq total) by mouth daily. (may take one extra tab when taking extra Lasix) 05/04/19  Yes Emokpae, Courage, MD  rosuvastatin  (CRESTOR) 5 MG tablet Take 5 mg by mouth daily. 12/22/18  Yes [provider]  vitamin B-12 (CYANOCOBALAMIN) 250 MCG tablet Take 250 mcg by mouth daily.   Yes [provider]    Physical Exam: BP (!) 149/99    Pulse 81    Temp 97.8 F (36.6 C)    Resp (!) 27    SpO2 96%    General: Elderly cachectic female. Awake and alert and oriented x3. No acute cardiopulmonary distress.   HEENT: Normocephalic atraumatic.  Right and left ears normal in appearance.  Pupils equal, round, reactive to light. Extraocular muscles are intact. Sclerae anicteric and noninjected.  Moist mucosal membranes. No mucosal lesions.   Neck: Neck supple without lymphadenopathy. No carotid bruits. No masses palpated.   Cardiovascular: Regular rate with normal S1-S2 sounds. No murmurs, rubs, gallops auscultated.  Increased JVD with 3+ pitting edema in lower extremities bilaterally.   Respiratory: Good respiratory effort with no wheezes, rales, rhonchi. Lungs clear to auscultation bilaterally.  No accessory muscle use.  Abdomen: Soft, nontender, nondistended. Active bowel sounds. No masses or hepatosplenomegaly   Skin: No rashes, lesions, or ulcerations.  Dry, warm to touch. 2+ dorsalis pedis and radial pulses.  Musculoskeletal: No calf or leg pain. All major joints not erythematous nontender.  No upper or lower joint deformation.  Good ROM.  No contractures   Psychiatric: Intact judgment and insight. Pleasant and cooperative.  Neurologic: No focal neurological deficits. Strength is 5/5 and symmetric in upper and lower extremities.  Cranial nerves II through XII are grossly intact.           Labs on Admission: I have personally reviewed following labs and imaging studies  CBC: Recent Labs  Lab 05/18/19 1420  WBC 11.0*  NEUTROABS 9.2*  HGB 13.9  HCT 42.0  MCV 102.4*  PLT 267   Basic Metabolic Panel: Recent Labs  Lab 05/18/19 1420  NA 124*  K 4.5  CL 85*  CO2 27  GLUCOSE 126*  BUN  20  CREATININE 1.16*  CALCIUM 8.6*   GFR: Estimated Creatinine Clearance: 26.8 mL/min (A) (by C-G formula based on SCr of 1.16 mg/dL (H)). Liver Function Tests: Recent Labs  Lab 05/18/19 1420  AST 37  ALT 34  ALKPHOS 80  BILITOT 1.0  PROT 6.2*  ALBUMIN 3.1*   No results for input(s): LIPASE, AMYLASE in the last 168 hours. No results for input(s): AMMONIA in the last 168 hours. Coagulation Profile: No results for input(s): INR, PROTIME in the last 168 hours. Cardiac Enzymes: No results for input(s): CKTOTAL, CKMB, CKMBINDEX, TROPONINI in the last 168 hours. BNP (last 3 results) No results for input(s): PROBNP in the last 8760 hours. HbA1C: No results for input(s): HGBA1C in the last 72 hours. CBG: No results for input(s): GLUCAP in the last 168 hours. Lipid Profile: No results for input(s): CHOL, HDL, LDLCALC, TRIG, CHOLHDL, LDLDIRECT in the last 72 hours. Thyroid Function Tests: No results for input(s): TSH, T4TOTAL, FREET4, T3FREE, THYROIDAB in the last 72 hours. Anemia Panel: No results for input(s): VITAMINB12, FOLATE, FERRITIN, TIBC, IRON, RETICCTPCT in the last 72 hours. Urine analysis:    Component Value Date/Time   COLORURINE YELLOW 01/18/2019 2329   APPEARANCEUR CLEAR 01/18/2019 2329   LABSPEC 1.019 01/18/2019 2329   PHURINE 5.0 01/18/2019 2329   GLUCOSEU NEGATIVE 01/18/2019 2329   HGBUR SMALL (A) 01/18/2019 2329   BILIRUBINUR NEGATIVE 01/18/2019 2329   KETONESUR NEGATIVE 01/18/2019 2329   PROTEINUR 100 (A) 01/18/2019 2329   NITRITE NEGATIVE 01/18/2019 2329   LEUKOCYTESUR NEGATIVE 01/18/2019 2329   Sepsis Labs: @LABRCNTIP (procalcitonin:4,lacticidven:4) )No results found for this or any previous visit (from the past 240 hour(s)).   Radiological Exams on Admission: DG Chest Port 1 View  Result Date: 05/18/2019 CLINICAL DATA:  Shortness of breath. EXAM: PORTABLE CHEST 1 VIEW COMPARISON:  05/02/2018 FINDINGS: Prior median sternotomy and scratched it prior  CABG. Stable severe cardiomegaly. Prominent right lower lobe atelectasis/infiltrate. Small right pleural effusion. No pneumothorax. Degenerative change thoracic spine. IMPRESSION: 1.  Prior CABG.  Stable severe cardiomegaly. 2. Prominent right lower lobe atelectasis/infiltrate. Small right pleural effusion. Electronically Signed   By: 05/04/2018  Register   On: 05/18/2019 14:11    EKG: Independently reviewed.  Sinus rhythm is and left atrial enlargement.  LVH.  No acute ST changes  Assessment/Plan: Principal Problem:   Acute on chronic systolic and diastolic heart failure, NYHA class 1 (HCC) Active Problems:   Ischemic cardiomyopathy   Hx of CABG  Pulmonary hypertension (HCC)   HTN (hypertension)   Failure to thrive in adult   Hyponatremia    This patient was discussed with the ED physician, including pertinent vitals, physical exam findings, labs, and imaging.  We also discussed care given by the ED provider.  1. Acute on chronic systolic and diastolic heart failure, ischemic cardiomyopathy, history of CABG Telemetry monitoring Strict I/O Daily Weights Diuresis: Lasix 60mg  BID Potassium: 40 mEq twice a day by mouth Echo cardiogram done within past 6 months Repeat BMP tomorrow 2. Hyponatremia  a. Secondary from CHF.  Will diurese and fluid restrict b. Recheck sodium in the morning 3. Pulmonary hypertension a.  4. Hypertension a. Continue control with antihypertensives 5. Possible infiltrate a. Check procalcitonin b. If elevated, will start patient on antibiotics for community-acquired pneumonia 6. Failure to thrive in adult a. Patient's BMI is 17 b. Dietary consult as the patient is protein deficient  DVT prophylaxis: Lovenox, renally adjusted Consultants: none Code Status: full Family Communication: husband present  Disposition Plan:    , DO

## 2019-05-19 LAB — BASIC METABOLIC PANEL
Anion gap: 13 (ref 5–15)
BUN: 23 mg/dL (ref 8–23)
CO2: 27 mmol/L (ref 22–32)
Calcium: 8.6 mg/dL — ABNORMAL LOW (ref 8.9–10.3)
Chloride: 88 mmol/L — ABNORMAL LOW (ref 98–111)
Creatinine, Ser: 1.13 mg/dL — ABNORMAL HIGH (ref 0.44–1.00)
GFR calc Af Amer: 55 mL/min — ABNORMAL LOW (ref >60)
GFR calc non Af Amer: 48 mL/min — ABNORMAL LOW (ref >60)
Glucose, Bld: 131 mg/dL — ABNORMAL HIGH (ref 70–99)
Potassium: 5 mmol/L (ref 3.5–5.1)
Sodium: 128 mmol/L — ABNORMAL LOW (ref 135–145)

## 2019-05-19 MED ORDER — ENOXAPARIN SODIUM 30 MG/0.3ML ~~LOC~~ SOLN
20.0000 mg | SUBCUTANEOUS | Status: DC
  Administered 2019-05-20: 01:00:00 20 mg via SUBCUTANEOUS
  Filled 2019-05-19: qty 0.3
  Filled 2019-05-19: qty 0.2

## 2019-05-19 NOTE — Progress Notes (Signed)
Initial Nutrition Assessment  DOCUMENTATION CODES:   Underweight(suspected PCM)  INTERVENTION:  Continue Ensure Enlive po BID, each supplement provides 350 kcal and 20 grams of protein Magic cup BID with meals, each supplement provides 290 kcal and 9 grams of protein   NUTRITION DIAGNOSIS:   Increased nutrient needs related to acute illness, chronic illness(acute on chronic systolic and diastolic heart failure) as evidenced by estimated needs.    GOAL:   Patient will meet greater than or equal to 90% of their needs   MONITOR:   Labs, I & O's, Supplement acceptance, PO intake, Weight trends  REASON FOR ASSESSMENT:   Consult Assessment of nutrition requirement/status  ASSESSMENT:  RD working remotely.  74 year old female with past medical history of chronic systolic and diastolic hear failure, EF 20-25%, ischemic cardiomyopathy, anxiety, HTN, HLD, recently discharged on 4/23 s/p diuresis afer presenting with SOB, followed up with outpatient cardiologist who increased carvedilol and Lasix presenting with ongoing SOB and increasing lower extremity edema.  Patient is on a heart healthy diet, no documented meals at this time for review. She is provided Ensure BID per medication review, will continue this and provide magic cup supplement with lunch and dinner meals.  Current wt 88.44 lbs Weight history reviewed, fairly stable 78 - 84 lbs from 11/2018-02/2019, on 03/15/19 she weighed 75.9 lbs then on 05/03/19 she weighed 89.32 lbs, on 5/03 she weighed 89.1 lbs. Suspect trends related to fluid shifts, actual weight likely is less given moderate/deep pitting BLE edema and elevated BNP. Patient is underweight and given history of present illness, highly suspect malnutrition however unable to identify at this time. Supplements provided as listed above, will plan on completing NFPE at follow-up to assess for fat and muscle depletions.  Medications reviewed and include: Lasix IV 60 mg twice  daily, Coreg, Klor-con Labs: Na 128 (L), BNP 4500   NUTRITION - FOCUSED PHYSICAL EXAM: Unable to complete at this time, RD working remotely.  Diet Order:   Diet Order            Diet Heart Room service appropriate? Yes; Fluid consistency: Thin  Diet effective now              EDUCATION NEEDS:   Not appropriate for education at this time  Skin:  Skin Assessment: Reviewed RN Assessment  Last BM:  5/6  Height:   Ht Readings from Last 1 Encounters:  05/18/19 5' (1.524 m)    Weight:   Wt Readings from Last 1 Encounters:  05/18/19 40.2 kg    Ideal Body Weight:  45.5 kg  BMI:  Body mass index is 17.31 kg/m.  Estimated Nutritional Needs:   Kcal:  1400-1600 (35-40 kcal/kg)  Protein:  70-80  Fluid:  >/= 1 L/day   Lars Masson, RD, LDN Clinical Nutrition After Hours/Weekend Pager # in Amion

## 2019-05-19 NOTE — Progress Notes (Signed)
Patient Demographics:    Susan [name], is a 74 y.o. female, DOB - January 27, 1945, RCV:[id number]  Admit date - 05/18/2019   Admitting Physician Truett Mainland, DO  Outpatient Primary MD for the patient is [name], MD  LOS - 1   Chief Complaint  Patient presents with  . Shortness of Breath        Subjective:    Susan [name] today has no fevers, no emesis,  No chest pain,   Husband at bedside C/o leg swelling and DOE Voiding ok  Assessment  & Plan :    Principal Problem:   Acute on chronic systolic and diastolic heart failure, NYHA class 1 (HCC) Active Problems:   Ischemic cardiomyopathy   Hx of CABG   Pulmonary hypertension (HCC)   HTN (hypertension)   Failure to thrive in adult   Hyponatremia    Brief Summary:- 74 y.o.femalewith medical history significant ofCAD (s/p CABG x5 in 2000 with cath in11/2020showing 2/5 patent grafts with patent LIMA-LAD, patent SVG-distal RCA, occluded SVG-D1 and occluded seq-SVG-OM1-OM2 with medical management recommended, repeat cath in 11/2018 showing stable anatomy with patent LIMA-LAD and patent SVG-dRCA with 50% mid-graft stenosis and medical therapy recommended), chronic combined systolic and diastolic (EF 10-25% by echo in 12/2016, at 45-50% by echo in 05/2017,EF 25 to30% by repeat echo in 01/19/19), HTN, HLD, COPD / emphysema, tobacco useadmitted on 05/18/19 with worsening dyspnea and worsening LE edema and concerns about CHF exacer and Rt LL PNA--- ---  BNP over 4500 and clinical findings consistent with CHF exacerbation  A/p 1)HFrEF/chronic systolic dysfunction CHF nonischemic or myopathy ---last known EF 25 to 30% based on echo from January 2021---patient failed oral diuretics at home PTA -BNP > 4,500 (was only 496 on 01/18/19) -Troponin in the 40s -Treat  with IV Lasix 60 mg twice daily -Continue Coreg 6.25 mg twice daily -Okay to  continue isosorbide 5 mg twice daily and hydralazine 25 mg daily -TED stockings  tolerated  2)COPD/emphysema/ongoing tobacco abuse---clinically no COPD flareup at this time continue as needed bronchodilators -Smoking cessation advised  3)CAD--status post prior CABG and prior angioplasty with stenting---stable, no chest pains, no evidence of frank ACS -Continue Crestor, Coreg, isosorbide, aspirin and Plavix  4)History of iron Deficiency Anemia--hemoglobin is currently above 13--appears clinically stable, no evidence of bleeding  5)Chronic Hyponatremia---sodium currently up to 128 from 124 on admission, baseline around 130----- which is close to patient's usual baseline, -Monitor closely with diuresis -Avoid excessive free water  6)FTT in an Adult--- moderate PCM, serum albumin is 3.1--- Ensure twice daily advised along with Magic cup  7)???Rt LL Infiltrate--- PCT < 0.10, WBC 11 -Empirically treat with Rocephin and azithromycin, -Repeat chest x-ray after diuresis -Elevated D-dimer noted, clinical suspicion for PE is low  Disposition/Need for in-Hospital Stay- patient unable to be discharged at this time due to -need for IV diuresis after failing oral Lasix PTA -Patient From: home D/C Place: home  Barriers: Not Clinically Stable-   Code Status : full  Family Communication:  (patient is alert, awake and coherent) -Discussed with husband at bedside  Consults  :  na  DVT Prophylaxis  :  Lovenox -  SCDs   Lab Results  Component Value Date   PLT 267  05/18/2019    Inpatient Medications  Scheduled Meds: . aspirin EC  81 mg Oral Q breakfast  . carvedilol  6.25 mg Oral BID  . clopidogrel  75 mg Oral Daily  . enoxaparin (LOVENOX) injection  20 mg Subcutaneous Q24H  . feeding supplement (ENSURE ENLIVE)  237 mL Oral BID BM  . furosemide  60 mg Intravenous BID  . hydrALAZINE  25 mg Oral TID  . isosorbide dinitrate  5 mg Oral BID  . potassium chloride  40 mEq Oral BID  .  rosuvastatin  5 mg Oral Daily  . sodium chloride flush  3 mL Intravenous Q12H   Continuous Infusions: . sodium chloride     PRN Meds:.sodium chloride, acetaminophen, albuterol, ALPRAZolam, sodium chloride flush    Anti-infectives (From admission, onward)   None        Objective:   Vitals:   05/18/19 2226 05/18/19 2229 05/18/19 2359 05/19/19 0355  BP: 130/90 130/90 127/79 126/90  Pulse:  85 86 92  Resp:   17 15  Temp:   97.7 F (36.5 C) 97.7 F (36.5 C)  TempSrc:   Oral Oral  SpO2:   97% 97%  Weight:      Height:        Wt Readings from Last 3 Encounters:  05/18/19 40.2 kg  05/14/19 40.5 kg  05/03/19 40.6 kg     Intake/Output Summary (Last 24 hours) at 05/19/2019 0941 Last data filed at 05/18/2019 1648 Gross per 24 hour  Intake -  Output 200 ml  Net -200 ml     Physical Exam  Gen:- Awake Alert, dyspnea on exertion HEENT:- [place].AT, No sclera icterus Neck-Supple Neck,No JVD,.  Lungs-diminished in bases with bibasilar rales CV- S1, S2 normal, regular, prior sternotomy scar Abd-  +ve B.Sounds, Abd Soft, No tenderness,    Extremity/Skin:-2+ pitting edema, pedal pulses present  Psych-affect is appropriate, baseline memory and cognitive deficits  neuro-denies weakness, no new focal deficits, no tremors   Data Review:   Micro Results Recent Results (from the past 240 hour(s))  Respiratory Panel by RT PCR (Flu A&B, Covid) - Nasopharyngeal Swab     Status: None   Collection Time: 05/18/19  6:48 PM   Specimen: Nasopharyngeal Swab  Result Value Ref Range Status   SARS Coronavirus 2 by RT PCR NEGATIVE NEGATIVE Final    Comment: (NOTE) SARS-CoV-2 target nucleic acids are NOT DETECTED. The SARS-CoV-2 RNA is generally detectable in upper respiratoy specimens during the acute phase of infection. The lowest concentration of SARS-CoV-2 viral copies this assay can detect is 131 copies/mL. A negative result does not preclude SARS-Cov-2 infection and should not be used as  the sole basis for treatment or other patient management decisions. A negative result may occur with  improper specimen collection/handling, submission of specimen other than nasopharyngeal swab, presence of viral mutation(s) within the areas targeted by this assay, and inadequate number of viral copies (<131 copies/mL). A negative result must be combined with clinical observations, patient history, and epidemiological information. The expected result is Negative. Fact Sheet for Patients:  https://www.moore.com/ Fact Sheet for Healthcare Providers:  https://www.young.biz/ This test is not yet ap proved or cleared by the [place] FDA and  has been authorized for detection and/or diagnosis of SARS-CoV-2 by FDA under an Emergency Use Authorization (EUA). This EUA will remain  in effect (meaning this test can be used) for the duration of the COVID-19 declaration under Section 564(b)(1) of the Act, 21 U.S.C. section 360bbb-3(b)(1),  unless the authorization is terminated or revoked sooner.    Influenza A by PCR NEGATIVE NEGATIVE Final   Influenza B by PCR NEGATIVE NEGATIVE Final    Comment: (NOTE) The Xpert Xpress SARS-CoV-2/FLU/RSV assay is intended as an aid in  the diagnosis of influenza from Nasopharyngeal swab specimens and  should not be used as a sole basis for treatment. Nasal washings and  aspirates are unacceptable for Xpert Xpress SARS-CoV-2/FLU/RSV  testing. Fact Sheet for Patients: https://www.moore.com/ Fact Sheet for Healthcare Providers: https://www.young.biz/ This test is not yet approved or cleared by the [place] FDA and  has been authorized for detection and/or diagnosis of SARS-CoV-2 by  FDA under an Emergency Use Authorization (EUA). This EUA will remain  in effect (meaning this test can be used) for the duration of the  Covid-19 declaration under Section 564(b)(1) of the Act, 21   U.S.C. section 360bbb-3(b)(1), unless the authorization is  terminated or revoked. Performed at [facility], [address] [place], [place] [address]     Radiology Reports DG Chest [place] 1 View  Result Date: 05/18/2019 CLINICAL DATA:  Shortness of breath. EXAM: PORTABLE CHEST 1 VIEW COMPARISON:  05/02/2018 FINDINGS: Prior median sternotomy and scratched it prior CABG. Stable severe cardiomegaly. Prominent right lower lobe atelectasis/infiltrate. Small right pleural effusion. No pneumothorax. Degenerative change thoracic spine. IMPRESSION: 1.  Prior CABG.  Stable severe cardiomegaly. 2. Prominent right lower lobe atelectasis/infiltrate. Small right pleural effusion. Electronically Signed   By: Maisie Fus  Register   On: 05/18/2019 14:11   DG Chest Portable 1 View  Result Date: 05/02/2019 CLINICAL DATA:  Short of breath, heart failure EXAM: PORTABLE CHEST 1 VIEW COMPARISON:  04/21/2019 FINDINGS: Single frontal view of the chest demonstrates persistent enlargement of the cardiac silhouette. Postsurgical changes from prior CABG. Background emphysema unchanged. There is chronic central vascular congestion without airspace disease, effusion, or pneumothorax. No acute bony abnormality. IMPRESSION: 1. Stable enlarged cardiac silhouette. 2. Chronic central vascular congestion, no acute process. 3. Emphysema. Electronically Signed   By: Sharlet Salina M.D.   On: 05/02/2019 19:10     CBC Recent Labs  Lab 05/18/19 1420  WBC 11.0*  HGB 13.9  HCT 42.0  PLT 267  MCV 102.4*  MCH 33.9  MCHC 33.1  RDW 13.7  LYMPHSABS 1.0  MONOABS 0.7  EOSABS 0.1  BASOSABS 0.0    Chemistries  Recent Labs  Lab 05/18/19 1420 05/19/19 0700  NA 124* 128*  K 4.5 5.0  CL 85* 88*  CO2 27 27  GLUCOSE 126* 131*  BUN 20 23  CREATININE 1.16* 1.13*  CALCIUM 8.6* 8.6*  AST 37  --   ALT 34  --   ALKPHOS 80  --   BILITOT 1.0  --     ------------------------------------------------------------------------------------------------------------------ No results for input(s): CHOL, HDL, LDLCALC, TRIG, CHOLHDL, LDLDIRECT in the last 72 hours.  No results found for: HGBA1C ------------------------------------------------------------------------------------------------------------------ No results for input(s): TSH, T4TOTAL, T3FREE, THYROIDAB in the last 72 hours.  Invalid input(s): FREET3 ------------------------------------------------------------------------------------------------------------------ No results for input(s): VITAMINB12, FOLATE, FERRITIN, TIBC, IRON, RETICCTPCT in the last 72 hours.  Coagulation profile No results for input(s): INR, PROTIME in the last 168 hours.  Recent Labs    05/18/19 1420  DDIMER 1.69*    Cardiac Enzymes No results for input(s): CKMB, TROPONINI, MYOGLOBIN in the last 168 hours.  Invalid input(s): CK ------------------------------------------------------------------------------------------------------------------    Component Value Date/Time   BNP >4,500.0 (H) 05/18/2019 1421     Shon Hale M.D on 05/19/2019  at 9:41 AM  Go to [email or web address] - for contact info  [facility] - Office  [phone]

## 2019-05-20 DIAGNOSIS — Z515 Encounter for palliative care: Secondary | ICD-10-CM

## 2019-05-20 DIAGNOSIS — F0390 Unspecified dementia without behavioral disturbance: Secondary | ICD-10-CM | POA: Diagnosis present

## 2019-05-20 DIAGNOSIS — F410 Panic disorder [episodic paroxysmal anxiety] without agoraphobia: Secondary | ICD-10-CM | POA: Diagnosis present

## 2019-05-20 LAB — BASIC METABOLIC PANEL
Anion gap: 13 (ref 5–15)
BUN: 30 mg/dL — ABNORMAL HIGH (ref 8–23)
CO2: 25 mmol/L (ref 22–32)
Calcium: 8.7 mg/dL — ABNORMAL LOW (ref 8.9–10.3)
Chloride: 89 mmol/L — ABNORMAL LOW (ref 98–111)
Creatinine, Ser: 1.19 mg/dL — ABNORMAL HIGH (ref 0.44–1.00)
GFR calc Af Amer: 52 mL/min — ABNORMAL LOW (ref >60)
GFR calc non Af Amer: 45 mL/min — ABNORMAL LOW (ref >60)
Glucose, Bld: 126 mg/dL — ABNORMAL HIGH (ref 70–99)
Potassium: 5.9 mmol/L — ABNORMAL HIGH (ref 3.5–5.1)
Sodium: 127 mmol/L — ABNORMAL LOW (ref 135–145)

## 2019-05-20 MED ORDER — HYDRALAZINE HCL 10 MG PO TABS
10.0000 mg | ORAL_TABLET | Freq: Three times a day (TID) | ORAL | Status: DC
  Administered 2019-05-20: 10 mg via ORAL
  Filled 2019-05-20: qty 1

## 2019-05-20 MED ORDER — METHOCARBAMOL 500 MG PO TABS
500.0000 mg | ORAL_TABLET | Freq: Two times a day (BID) | ORAL | Status: DC
  Administered 2019-05-20 – 2019-05-23 (×5): 500 mg via ORAL
  Filled 2019-05-20 (×6): qty 1

## 2019-05-20 MED ORDER — FUROSEMIDE 10 MG/ML IJ SOLN
40.0000 mg | Freq: Two times a day (BID) | INTRAMUSCULAR | Status: DC
  Administered 2019-05-20: 18:00:00 40 mg via INTRAVENOUS
  Filled 2019-05-20: qty 4

## 2019-05-20 MED ORDER — ENOXAPARIN SODIUM 300 MG/3ML IJ SOLN
20.0000 mg | INTRAMUSCULAR | Status: DC
  Administered 2019-05-20: 23:00:00 20 mg via SUBCUTANEOUS
  Filled 2019-05-20: qty 0.2

## 2019-05-20 MED ORDER — SODIUM POLYSTYRENE SULFONATE 15 GM/60ML PO SUSP
15.0000 g | Freq: Once | ORAL | Status: AC
  Administered 2019-05-20: 15 g via ORAL
  Filled 2019-05-20: qty 60

## 2019-05-20 MED ORDER — ACETAMINOPHEN 500 MG PO TABS
1000.0000 mg | ORAL_TABLET | Freq: Once | ORAL | Status: AC
  Administered 2019-05-20: 1000 mg via ORAL
  Filled 2019-05-20: qty 2

## 2019-05-20 MED ORDER — NICOTINE 14 MG/24HR TD PT24
14.0000 mg | MEDICATED_PATCH | Freq: Every day | TRANSDERMAL | Status: DC
  Administered 2019-05-20 – 2019-05-23 (×4): 14 mg via TRANSDERMAL
  Filled 2019-05-20 (×4): qty 1

## 2019-05-20 MED ORDER — ALPRAZOLAM 0.25 MG PO TABS
0.2500 mg | ORAL_TABLET | Freq: Three times a day (TID) | ORAL | Status: DC | PRN
  Administered 2019-05-20: 0.25 mg via ORAL
  Filled 2019-05-20 (×2): qty 1

## 2019-05-20 MED ORDER — DILTIAZEM HCL 25 MG/5ML IV SOLN
10.0000 mg | Freq: Once | INTRAVENOUS | Status: AC
  Administered 2019-05-20: 10 mg via INTRAVENOUS
  Filled 2019-05-20: qty 5

## 2019-05-20 MED ORDER — METOPROLOL TARTRATE 5 MG/5ML IV SOLN
5.0000 mg | Freq: Once | INTRAVENOUS | Status: AC
  Administered 2019-05-21: 5 mg via INTRAVENOUS
  Filled 2019-05-20: qty 5

## 2019-05-20 NOTE — Progress Notes (Signed)
Patient Demographics:    Susan [name], is a 74 y.o. female, DOB - 09-Feb-1945, FUX:[id number]  Admit date - 05/18/2019   Admitting Physician Levie Heritage, DO  Outpatient Primary MD for the patient is [name], MD  LOS - 2   Chief Complaint  Patient presents with  . Shortness of Breath        Subjective:    Susan [name] today has no fevers, no emesis,  No chest pain,   Husband and daughter at bedside Had "panic" attack  Voiding ok Tachycardia from time to time   Assessment  & Plan :    Principal Problem:   Acute on chronic systolic and diastolic heart failure, NYHA class 1 (HCC) Active Problems:   Palliative care encounter   Anxiety attack/Panic Attack/Insomnia   Goals of care, counseling/discussion   Ischemic cardiomyopathy   Hx of CABG   Pulmonary hypertension (HCC)   HTN (hypertension)   Failure to thrive in adult   Hyponatremia   Dementia without behavioral disturbance (HCC)  Brief Summary:- 74 y.o.femalewith medical history significant ofCAD (s/p CABG x5 in 2000 with cath in11/2020showing 2/5 patent grafts with patent LIMA-LAD, patent SVG-distal RCA, occluded SVG-D1 and occluded seq-SVG-OM1-OM2 with medical management recommended, repeat cath in 11/2018 showing stable anatomy with patent LIMA-LAD and patent SVG-dRCA with 50% mid-graft stenosis and medical therapy recommended), chronic combined systolic and diastolic (EF 20-25% by echo in 12/2016, at 45-50% by echo in 05/2017,EF 25 to30% by repeat echo in 01/19/19), HTN, HLD, COPD / emphysema, tobacco useadmitted on 05/18/19 with worsening dyspnea and worsening LE edema and concerns about CHF exacer and Rt LL PNA--- ---  BNP over 4500 and clinical findings consistent with CHF exacerbation  A/p 1)HFrEF/chronic systolic dysfunction CHF nonischemic or myopathy ---last known EF 25 to 30% based on echo from January  2021---patient failed oral diuretics at home PTA -BNP > 4,500 (was only 496 on 01/18/19) -Decrease  IV Lasix to 40 mg bid from 60 mg twice daily due to soft bp -Continue Coreg 6.25 mg twice daily -Okay to continue isosorbide 5 mg twice daily and Decrease hydralazine to 10 mg bid from  25 mg  -TED stockings   -no weight documented and I/o is incomplete  2)COPD/emphysema/ongoing tobacco abuse---clinically no COPD flareup at this time continue as needed bronchodilators -Smoking cessation advised, give Nicotine patch   3)CAD--status post prior CABG and prior angioplasty with stenting---stable, no chest pains, no evidence of frank ACS -Continue Crestor, Coreg, isosorbide, aspirin and Plavix  4)History of iron Deficiency Anemia---appears clinically stable, no evidence of bleeding  5)Chronic Hyponatremia---sodium currently up to 127 from 124 on admission, baseline around 130-----  -Monitor closely with diuresis -Avoid excessive free water  6)FTT in an Adult--- moderate PCM, serum albumin is 3.1--- Ensure twice daily advised along with Magic cup  7)???Rt LL Infiltrate--- PCT < 0.10, WBC 11 -c/n  Rocephin and azithromycin, -Repeat chest x-ray after diuresis -Elevated D-dimer noted, clinical suspicion for PE is low  8)Anxiety DO/Panic Attacks/Insomnia--- xanax 0.25 mg q 8 hrs prn  9)Social/Ethics--- given age, low EF and overall comorbid conditions--- overall prognosis is poor --Palliative care consult requested to help further delineate goals of care --Currently full code  10)Dementia--patient with mild to moderate  cognitive and memory deficits  Disposition/Need for in-Hospital Stay- patient unable to be discharged at this time due to -need for IV diuresis after failing oral Lasix PTA -Patient From: home D/C Place: home with [facility] and possibly palliative care Barriers: Not Clinically Stable-   Code Status : full  Family Communication:  (patient is alert, awake and  coherent) -Discussed with husband and daughter at bedside  Consults  :  na  DVT Prophylaxis  :  Lovenox -  SCDs   Lab Results  Component Value Date   PLT 267 05/18/2019   Inpatient Medications  Scheduled Meds: . acetaminophen  1,000 mg Oral Once  . aspirin EC  81 mg Oral Q breakfast  . carvedilol  6.25 mg Oral BID  . clopidogrel  75 mg Oral Daily  . enoxaparin (LOVENOX) injection  20 mg Subcutaneous Q24H  . feeding supplement (ENSURE ENLIVE)  237 mL Oral BID BM  . furosemide  40 mg Intravenous BID  . hydrALAZINE  10 mg Oral TID  . isosorbide dinitrate  5 mg Oral BID  . methocarbamol  500 mg Oral BID  . nicotine  14 mg Transdermal Daily  . rosuvastatin  5 mg Oral Daily  . sodium chloride flush  3 mL Intravenous Q12H   Continuous Infusions: . sodium chloride     PRN Meds:.sodium chloride, acetaminophen, albuterol, ALPRAZolam, sodium chloride flush   Anti-infectives (From admission, onward)   None       Objective:   Vitals:   05/20/19 1343 05/20/19 1350 05/20/19 1401 05/20/19 1456  BP: 121/71  98/68 124/69  Pulse: 97     Resp: 18  20 18   Temp:  97.9 F (36.6 C)  97.8 F (36.6 C)  TempSrc:    Oral  SpO2: 98%   98%  Weight:      Height:        Wt Readings from Last 3 Encounters:  05/18/19 40.2 kg  05/14/19 40.5 kg  05/03/19 40.6 kg    Intake/Output Summary (Last 24 hours) at 05/20/2019 1629 Last data filed at 05/20/2019 1300 Gross per 24 hour  Intake 720 ml  Output 500 ml  Net 220 ml   Physical Exam  Gen:- Awake Alert, dyspnea on exertion HEENT:- [employer].AT, No sclera icterus Neck-Supple Neck,No JVD,.  Lungs-diminished in bases with bibasilar rales CV- S1, S2 normal, regular, prior sternotomy scar Abd-  +ve B.Sounds, Abd Soft, No tenderness,    Extremity/Skin:-2+ pitting edema, pedal pulses present  Psych-affect is anxious,, baseline memory and cognitive deficits  neuro-generalized weakness, no new focal deficits, no tremors   Data Review:   Micro  Results Recent Results (from the past 240 hour(s))  Respiratory Panel by RT PCR (Flu A&B, Covid) - Nasopharyngeal Swab     Status: None   Collection Time: 05/18/19  6:48 PM   Specimen: Nasopharyngeal Swab  Result Value Ref Range Status   SARS Coronavirus 2 by RT PCR NEGATIVE NEGATIVE Final    Comment: (NOTE) SARS-CoV-2 target nucleic acids are NOT DETECTED. The SARS-CoV-2 RNA is generally detectable in upper respiratoy specimens during the acute phase of infection. The lowest concentration of SARS-CoV-2 viral copies this assay can detect is 131 copies/mL. A negative result does not preclude SARS-Cov-2 infection and should not be used as the sole basis for treatment or other patient management decisions. A negative result may occur with  improper specimen collection/handling, submission of specimen other than nasopharyngeal swab, presence of viral mutation(s) within the areas targeted by  this assay, and inadequate number of viral copies (<131 copies/mL). A negative result must be combined with clinical observations, patient history, and epidemiological information. The expected result is Negative. Fact Sheet for Patients:  https://www.moore.com/ Fact Sheet for Healthcare Providers:  https://www.young.biz/ This test is not yet ap proved or cleared by the [place] FDA and  has been authorized for detection and/or diagnosis of SARS-CoV-2 by FDA under an Emergency Use Authorization (EUA). This EUA will remain  in effect (meaning this test can be used) for the duration of the COVID-19 declaration under Section 564(b)(1) of the Act, 21 U.S.C. section 360bbb-3(b)(1), unless the authorization is terminated or revoked sooner.    Influenza A by PCR NEGATIVE NEGATIVE Final   Influenza B by PCR NEGATIVE NEGATIVE Final    Comment: (NOTE) The Xpert Xpress SARS-CoV-2/FLU/RSV assay is intended as an aid in  the diagnosis of influenza from Nasopharyngeal  swab specimens and  should not be used as a sole basis for treatment. Nasal washings and  aspirates are unacceptable for Xpert Xpress SARS-CoV-2/FLU/RSV  testing. Fact Sheet for Patients: https://www.moore.com/ Fact Sheet for Healthcare Providers: https://www.young.biz/ This test is not yet approved or cleared by the [place] FDA and  has been authorized for detection and/or diagnosis of SARS-CoV-2 by  FDA under an Emergency Use Authorization (EUA). This EUA will remain  in effect (meaning this test can be used) for the duration of the  Covid-19 declaration under Section 564(b)(1) of the Act, 21  U.S.C. section 360bbb-3(b)(1), unless the authorization is  terminated or revoked. Performed at [facility], [address], [place], [place] [address]    Radiology Reports DG Chest [place] 1 View  Result Date: 05/18/2019 CLINICAL DATA:  Shortness of breath. EXAM: PORTABLE CHEST 1 VIEW COMPARISON:  05/02/2018 FINDINGS: Prior median sternotomy and scratched it prior CABG. Stable severe cardiomegaly. Prominent right lower lobe atelectasis/infiltrate. Small right pleural effusion. No pneumothorax. Degenerative change thoracic spine. IMPRESSION: 1.  Prior CABG.  Stable severe cardiomegaly. 2. Prominent right lower lobe atelectasis/infiltrate. Small right pleural effusion. Electronically Signed   By: Maisie Fus  Register   On: 05/18/2019 14:11   DG Chest Portable 1 View  Result Date: 05/02/2019 CLINICAL DATA:  Short of breath, heart failure EXAM: PORTABLE CHEST 1 VIEW COMPARISON:  04/21/2019 FINDINGS: Single frontal view of the chest demonstrates persistent enlargement of the cardiac silhouette. Postsurgical changes from prior CABG. Background emphysema unchanged. There is chronic central vascular congestion without airspace disease, effusion, or pneumothorax. No acute bony abnormality. IMPRESSION: 1. Stable enlarged cardiac silhouette. 2. Chronic central vascular  congestion, no acute process. 3. Emphysema. Electronically Signed   By: Sharlet Salina M.D.   On: 05/02/2019 19:10    CBC Recent Labs  Lab 05/18/19 1420  WBC 11.0*  HGB 13.9  HCT 42.0  PLT 267  MCV 102.4*  MCH 33.9  MCHC 33.1  RDW 13.7  LYMPHSABS 1.0  MONOABS 0.7  EOSABS 0.1  BASOSABS 0.0    Chemistries  Recent Labs  Lab 05/18/19 1420 05/19/19 0700 05/20/19 0621  NA 124* 128* 127*  K 4.5 5.0 5.9*  CL 85* 88* 89*  CO2 27 27 25   GLUCOSE 126* 131* 126*  BUN 20 23 30*  CREATININE 1.16* 1.13* 1.19*  CALCIUM 8.6* 8.6* 8.7*  AST 37  --   --   ALT 34  --   --   ALKPHOS 80  --   --   BILITOT 1.0  --   --    ------------------------------------------------------------------------------------------------------------------  No results for input(s): CHOL, HDL, LDLCALC, TRIG, CHOLHDL, LDLDIRECT in the last 72 hours.  No results found for: HGBA1C ------------------------------------------------------------------------------------------------------------------ No results for input(s): TSH, T4TOTAL, T3FREE, THYROIDAB in the last 72 hours.  Invalid input(s): FREET3 ------------------------------------------------------------------------------------------------------------------ No results for input(s): VITAMINB12, FOLATE, FERRITIN, TIBC, IRON, RETICCTPCT in the last 72 hours.  Coagulation profile No results for input(s): INR, PROTIME in the last 168 hours.  Recent Labs    05/18/19 1420  DDIMER 1.69*    Cardiac Enzymes No results for input(s): CKMB, TROPONINI, MYOGLOBIN in the last 168 hours.  Invalid input(s): CK ------------------------------------------------------------------------------------------------------------------    Component Value Date/Time   BNP >4,500.0 (H) 05/18/2019 1421   Roxan Hockey M.D on 05/20/2019 at 4:29 PM  Go to [email or web address] - for contact info  [facility] - Office  [phone]

## 2019-05-20 NOTE — Progress Notes (Signed)
RAPID RESPONSE NOTE:  Contacted by primary RN, Chales Abrahams, about change in Vital signs with MEWs score RED. HR increased to 140s, noted by telemetry.  Patient assessment finds patient in no distress. Respirations even and unlabored, skin warm/dry. Patient denies CP, SOB, dizziness. Throughout patient assessment, HR noted to decrease to 94-100 and irregular rhythm on telemetry and confirmed by apical pulse.    Dr Mariea Clonts notified by Chales Abrahams of changes with acknowledgement of patient change in HR. No orders received at this time. Vital signs will be obtained q1h x 4 per protocol. Will continue to monitor patient closely.

## 2019-05-21 ENCOUNTER — Other Ambulatory Visit (HOSPITAL_COMMUNITY): Payer: Medicare PPO

## 2019-05-21 DIAGNOSIS — Z515 Encounter for palliative care: Secondary | ICD-10-CM

## 2019-05-21 DIAGNOSIS — R627 Adult failure to thrive: Secondary | ICD-10-CM

## 2019-05-21 DIAGNOSIS — I255 Ischemic cardiomyopathy: Secondary | ICD-10-CM

## 2019-05-21 DIAGNOSIS — I4892 Unspecified atrial flutter: Secondary | ICD-10-CM

## 2019-05-21 DIAGNOSIS — Z7189 Other specified counseling: Secondary | ICD-10-CM

## 2019-05-21 DIAGNOSIS — I959 Hypotension, unspecified: Secondary | ICD-10-CM

## 2019-05-21 DIAGNOSIS — I214 Non-ST elevation (NSTEMI) myocardial infarction: Secondary | ICD-10-CM | POA: Diagnosis present

## 2019-05-21 DIAGNOSIS — Z951 Presence of aortocoronary bypass graft: Secondary | ICD-10-CM

## 2019-05-21 DIAGNOSIS — K922 Gastrointestinal hemorrhage, unspecified: Secondary | ICD-10-CM

## 2019-05-21 LAB — PROTIME-INR
INR: 1.1 (ref 0.8–1.2)
Prothrombin Time: 14 seconds (ref 11.4–15.2)

## 2019-05-21 LAB — CBC WITH DIFFERENTIAL/PLATELET
Abs Immature Granulocytes: 0.09 10*3/uL — ABNORMAL HIGH (ref 0.00–0.07)
Basophils Absolute: 0 10*3/uL (ref 0.0–0.1)
Basophils Relative: 0 %
Eosinophils Absolute: 0 10*3/uL (ref 0.0–0.5)
Eosinophils Relative: 0 %
HCT: 33 % — ABNORMAL LOW (ref 36.0–46.0)
Hemoglobin: 10.6 g/dL — ABNORMAL LOW (ref 12.0–15.0)
Immature Granulocytes: 1 %
Lymphocytes Relative: 6 %
Lymphs Abs: 0.7 10*3/uL (ref 0.7–4.0)
MCH: 33.5 pg (ref 26.0–34.0)
MCHC: 32.1 g/dL (ref 30.0–36.0)
MCV: 104.4 fL — ABNORMAL HIGH (ref 80.0–100.0)
Monocytes Absolute: 1.1 10*3/uL — ABNORMAL HIGH (ref 0.1–1.0)
Monocytes Relative: 9 %
Neutro Abs: 10.6 10*3/uL — ABNORMAL HIGH (ref 1.7–7.7)
Neutrophils Relative %: 84 %
Platelets: 258 10*3/uL (ref 150–400)
RBC: 3.16 MIL/uL — ABNORMAL LOW (ref 3.87–5.11)
RDW: 14 % (ref 11.5–15.5)
WBC: 12.5 10*3/uL — ABNORMAL HIGH (ref 4.0–10.5)
nRBC: 0 % (ref 0.0–0.2)

## 2019-05-21 LAB — COMPREHENSIVE METABOLIC PANEL
ALT: 29 U/L (ref 0–44)
AST: 61 U/L — ABNORMAL HIGH (ref 15–41)
Albumin: 2.4 g/dL — ABNORMAL LOW (ref 3.5–5.0)
Alkaline Phosphatase: 70 U/L (ref 38–126)
Anion gap: 10 (ref 5–15)
BUN: 30 mg/dL — ABNORMAL HIGH (ref 8–23)
CO2: 29 mmol/L (ref 22–32)
Calcium: 8.1 mg/dL — ABNORMAL LOW (ref 8.9–10.3)
Chloride: 89 mmol/L — ABNORMAL LOW (ref 98–111)
Creatinine, Ser: 1.14 mg/dL — ABNORMAL HIGH (ref 0.44–1.00)
GFR calc Af Amer: 54 mL/min — ABNORMAL LOW (ref >60)
GFR calc non Af Amer: 47 mL/min — ABNORMAL LOW (ref >60)
Glucose, Bld: 116 mg/dL — ABNORMAL HIGH (ref 70–99)
Potassium: 4.1 mmol/L (ref 3.5–5.1)
Sodium: 128 mmol/L — ABNORMAL LOW (ref 135–145)
Total Bilirubin: 1.1 mg/dL (ref 0.3–1.2)
Total Protein: 5.5 g/dL — ABNORMAL LOW (ref 6.5–8.1)

## 2019-05-21 LAB — BASIC METABOLIC PANEL
Anion gap: 12 (ref 5–15)
BUN: 29 mg/dL — ABNORMAL HIGH (ref 8–23)
CO2: 27 mmol/L (ref 22–32)
Calcium: 7.9 mg/dL — ABNORMAL LOW (ref 8.9–10.3)
Chloride: 90 mmol/L — ABNORMAL LOW (ref 98–111)
Creatinine, Ser: 1.14 mg/dL — ABNORMAL HIGH (ref 0.44–1.00)
GFR calc Af Amer: 54 mL/min — ABNORMAL LOW (ref >60)
GFR calc non Af Amer: 47 mL/min — ABNORMAL LOW (ref >60)
Glucose, Bld: 124 mg/dL — ABNORMAL HIGH (ref 70–99)
Potassium: 4 mmol/L (ref 3.5–5.1)
Sodium: 129 mmol/L — ABNORMAL LOW (ref 135–145)

## 2019-05-21 LAB — APTT: aPTT: 28 seconds (ref 24–36)

## 2019-05-21 LAB — TYPE AND SCREEN
ABO/RH(D): A NEG
Antibody Screen: NEGATIVE

## 2019-05-21 LAB — MRSA PCR SCREENING: MRSA by PCR: NEGATIVE

## 2019-05-21 LAB — HEMOGLOBIN AND HEMATOCRIT, BLOOD
HCT: 26.8 % — ABNORMAL LOW (ref 36.0–46.0)
Hemoglobin: 8.4 g/dL — ABNORMAL LOW (ref 12.0–15.0)

## 2019-05-21 LAB — TROPONIN I (HIGH SENSITIVITY)
Troponin I (High Sensitivity): 2766 ng/L (ref <18)
Troponin I (High Sensitivity): 6081 ng/L (ref <18)
Troponin I (High Sensitivity): 7588 ng/L (ref <18)

## 2019-05-21 MED ORDER — GLYCOPYRROLATE 0.2 MG/ML IJ SOLN
0.2000 mg | INTRAMUSCULAR | Status: DC | PRN
  Administered 2019-05-22: 13:00:00 0.2 mg via INTRAVENOUS
  Filled 2019-05-21: qty 1

## 2019-05-21 MED ORDER — AMIODARONE HCL IN DEXTROSE 360-4.14 MG/200ML-% IV SOLN: 30.0000 mg/h | INTRAVENOUS | Status: DC

## 2019-05-21 MED ORDER — FUROSEMIDE 10 MG/ML IJ SOLN: 40.0000 mg | Freq: Every day | INTRAMUSCULAR | Status: DC

## 2019-05-21 MED ORDER — AMIODARONE HCL 150 MG/3ML IV SOLN
150.0000 mg | Freq: Once | INTRAVENOUS | Status: DC
  Filled 2019-05-21: qty 3

## 2019-05-21 MED ORDER — METOPROLOL TARTRATE 5 MG/5ML IV SOLN
5.0000 mg | Freq: Four times a day (QID) | INTRAVENOUS | Status: DC
  Filled 2019-05-21: qty 5

## 2019-05-21 MED ORDER — SODIUM CHLORIDE 0.9 % IV SOLN
80.0000 mg | Freq: Once | INTRAVENOUS | Status: AC
  Administered 2019-05-21: 80 mg via INTRAVENOUS
  Filled 2019-05-21 (×2): qty 80

## 2019-05-21 MED ORDER — DILTIAZEM HCL 25 MG/5ML IV SOLN
10.0000 mg | Freq: Once | INTRAVENOUS | Status: AC
  Administered 2019-05-21: 10 mg via INTRAVENOUS
  Filled 2019-05-21: qty 5

## 2019-05-21 MED ORDER — NITROGLYCERIN 0.4 MG SL SUBL
SUBLINGUAL_TABLET | SUBLINGUAL | Status: AC
  Filled 2019-05-21: qty 1

## 2019-05-21 MED ORDER — MORPHINE SULFATE (PF) 2 MG/ML IV SOLN
1.0000 mg | INTRAVENOUS | Status: DC | PRN
  Administered 2019-05-22 – 2019-05-23 (×3): 2 mg via INTRAVENOUS
  Filled 2019-05-21 (×3): qty 1

## 2019-05-21 MED ORDER — ORAL CARE MOUTH RINSE
15.0000 mL | Freq: Two times a day (BID) | OROMUCOSAL | Status: DC
  Administered 2019-05-21 – 2019-05-23 (×4): 15 mL via OROMUCOSAL

## 2019-05-21 MED ORDER — CHLORHEXIDINE GLUCONATE CLOTH 2 % EX PADS
6.0000 | MEDICATED_PAD | Freq: Every day | CUTANEOUS | Status: DC
  Administered 2019-05-21: 6 via TOPICAL

## 2019-05-21 MED ORDER — HEPARIN (PORCINE) 25000 UT/250ML-% IV SOLN
450.0000 [IU]/h | INTRAVENOUS | Status: DC
  Administered 2019-05-21: 450 [IU]/h via INTRAVENOUS
  Filled 2019-05-21: qty 250

## 2019-05-21 MED ORDER — AMIODARONE LOAD VIA INFUSION
150.0000 mg | Freq: Once | INTRAVENOUS | Status: AC
  Administered 2019-05-21: 150 mg via INTRAVENOUS
  Filled 2019-05-21: qty 83.34

## 2019-05-21 MED ORDER — ALPRAZOLAM 0.25 MG PO TABS
0.2500 mg | ORAL_TABLET | Freq: Four times a day (QID) | ORAL | Status: DC | PRN
  Administered 2019-05-21 – 2019-05-23 (×3): 0.25 mg via ORAL
  Filled 2019-05-21 (×3): qty 1

## 2019-05-21 MED ORDER — MELATONIN 3 MG PO TABS
6.0000 mg | ORAL_TABLET | Freq: Once | ORAL | Status: AC
  Administered 2019-05-21: 6 mg via ORAL
  Filled 2019-05-21: qty 2

## 2019-05-21 MED ORDER — PANTOPRAZOLE SODIUM 40 MG IV SOLR
INTRAVENOUS | Status: AC
  Filled 2019-05-21: qty 80

## 2019-05-21 MED ORDER — NITROGLYCERIN 0.4 MG SL SUBL: 0.4000 mg | SUBLINGUAL_TABLET | SUBLINGUAL | Status: DC | PRN

## 2019-05-21 MED ORDER — METOPROLOL TARTRATE 5 MG/5ML IV SOLN: 5.0000 mg | Freq: Four times a day (QID) | INTRAVENOUS | Status: DC | PRN

## 2019-05-21 MED ORDER — ASPIRIN 325 MG PO TABS
325.0000 mg | ORAL_TABLET | Freq: Once | ORAL | Status: AC
  Administered 2019-05-21: 325 mg via ORAL
  Filled 2019-05-21: qty 1

## 2019-05-21 MED ORDER — AMIODARONE HCL IN DEXTROSE 360-4.14 MG/200ML-% IV SOLN
60.0000 mg/h | INTRAVENOUS | Status: DC
  Administered 2019-05-21: 60 mg/h via INTRAVENOUS

## 2019-05-21 MED ORDER — ACETAMINOPHEN 650 MG RE SUPP: 650.0000 mg | Freq: Four times a day (QID) | RECTAL | Status: DC | PRN

## 2019-05-21 MED ORDER — MELATONIN 5 MG PO TABS
5.0000 mg | ORAL_TABLET | Freq: Once | ORAL | Status: DC
  Filled 2019-05-21: qty 1

## 2019-05-21 MED ORDER — MORPHINE SULFATE (PF) 2 MG/ML IV SOLN: 1.0000 mg | INTRAVENOUS | Status: DC | PRN

## 2019-05-21 NOTE — Consult Note (Addendum)
Referring Provider: Dr. Bunnie Pion Primary Care Physician:  [name], MD Primary Gastroenterologist:  Dr. Oneida Alar   Date of Admission: 05/18/19 Date of Consultation: 05/21/19  Reason for Consultation: Rectal bleeding HPI:  Susan [name] is a 74 y.o. year old female with multiple comorbidities to include heart failure, CAD, CKD, COPD, mild dementia, hospitalized in Jan 2021 with UGI bleed in setting of aspirin and Plavix, aspirin powders and no PPI. Underwent EGD after transfer to [facility] with Normal esophagus, ulcerative gastritis s/p biopsy. Negative H.pylori. Required transfusion at that time. Presented this admission with heart failure exacerbation. Developed chest pain, rapid afib/flutter overnight and started on IV heparin, with subsequent rectal bleeding and acute drop in Hgb. Heparin stopped and Protonix bolus given. Transferred to ICU with NSTEMI, elevated troponins, started on IV amiodarone. GI consulted due to rectal bleeding. Hgb 13.9 several days ago, 10.6 this morning, and 8.4 around 10am.   No prior colonoscopy. Daughter and husband at bedside. Patient is not able to provide history, acutely ill. Daughter states appetite has declined recently.  No abdominal pain. No nausea or vomiting. No dysphagia. No PPI prior to admission. No further aspirin powders. Has continued on aspirin and plavix as outpatient.   HR 135, BP 79/64.   Past Medical History:  Diagnosis Date  . Acute on chronic systolic and diastolic heart failure, NYHA class 1 ([place]) 11/21/2018  . Anxiety   . CAD (coronary artery disease)    a. s/p CABG x5 in 2000 b. cath in 02/2017 showing 2/5 patent grafts with patent LIMA-LAD, patent SVG-distal RCA, occluded SVG-D1 and occluded seq-SVG-OM1-OM2 with medical management recommended and possible PCI of distal RCA branches in the future if recurrent anginal symptoms  . CHF (congestive heart failure) ([place])    a. EF 20-25% by echo in 12/2016 b. EF 45-50% by echo in  05/2017 c. EF 30% by echo in 11/2018  . Chronic bronchitis ([place])   . CKD (chronic kidney disease) stage 3, GFR 30-59 ml/min 11/21/2018  . COPD (chronic obstructive pulmonary disease) ([place])   . Dizziness   . Esophageal reflux   . Essential hypertension   . Fatigue   . HLD (hyperlipidemia) 11/21/2018  . HTN (hypertension) 11/21/2018  . Hypercholesteremia   . Hyperkalemia   . Ovarian failure   . SOB (shortness of breath)     Past Surgical History:  Procedure Laterality Date  . ABDOMINAL HYSTERECTOMY    . APPENDECTOMY    . BIOPSY  01/22/2019   Procedure: BIOPSY;  Surgeon: Jerene Bears, MD;  Location: [place];  Service: Gastroenterology;;  . BREAST BIOPSY Right   . CARDIAC CATHETERIZATION  2000  . CORONARY ARTERY BYPASS GRAFT  2000   CABG X5  . ESOPHAGOGASTRODUODENOSCOPY (EGD) WITH PROPOFOL N/A 01/22/2019   Procedure: ESOPHAGOGASTRODUODENOSCOPY (EGD) WITH PROPOFOL;  Surgeon: Jerene Bears, MD;  Location: [facility] ENDOSCOPY;  Service: Gastroenterology;  Laterality: N/A;  . LEFT HEART CATH AND CORS/GRAFTS ANGIOGRAPHY N/A 02/17/2017   Procedure: LEFT HEART CATH AND CORS/GRAFTS ANGIOGRAPHY;  Surgeon: Burnell Blanks, MD;  Location: [place] CV LAB;  Service: Cardiovascular;  Laterality: N/A;  . LEFT HEART CATH AND CORS/GRAFTS ANGIOGRAPHY N/A 11/20/2018   Procedure: LEFT HEART CATH AND CORS/GRAFTS ANGIOGRAPHY;  Surgeon: Nelva Bush, MD;  Location: [place] [facility];  Service: Cardiovascular;  Laterality: N/A;    Prior to Admission medications   Medication Sig Start Date End Date Taking? Authorizing Provider  acetaminophen (TYLENOL) 325 MG tablet Take 2 tablets (  650 mg total) by mouth every 6 (six) hours as needed for mild pain, fever or headache. 05/04/19  Yes Emokpae, Courage, MD  albuterol (VENTOLIN HFA) 108 (90 Base) MCG/ACT inhaler Inhale 1-2 puffs into the lungs every 6 (six) hours as needed for wheezing or shortness of breath. 05/04/19  Yes Emokpae, Courage, MD  ALPRAZolam  Prudy Feeler) 0.25 MG tablet Take 1 tablet (0.25 mg total) by mouth at bedtime as needed for anxiety. 05/04/19 05/03/20 Yes Shon Hale, MD  aspirin EC 81 MG tablet Take 1 tablet (81 mg total) by mouth daily with breakfast. 05/04/19  Yes Emokpae, Courage, MD  carvedilol (COREG) 6.25 MG tablet Take 1 tablet (6.25 mg total) by mouth 2 (two) times daily. 05/14/19  Yes Netta Neat., NP  clopidogrel (PLAVIX) 75 MG tablet Take 1 tablet (75 mg total) by mouth daily. 05/04/19  Yes Shon Hale, MD  ferrous sulfate 325 (65 FE) MG tablet Take 1 tablet (325 mg total) by mouth 2 (two) times daily with a meal. 01/24/19  Yes Arnetha Courser, MD  furosemide (LASIX) 40 MG tablet Take 1 tablet (40 mg total) by mouth daily. (may take extra as needed for weight gain of 3 pounds in 1 day or 5 pounds in 1 week) 12/11/18  Yes Laqueta Linden, MD  hydrALAZINE (APRESOLINE) 25 MG tablet Take 1 tablet (25 mg total) by mouth 3 (three) times daily. 05/04/19  Yes Emokpae, Courage, MD  isosorbide dinitrate (ISORDIL) 5 MG tablet Take 5 mg by mouth 2 (two) times daily.   Yes [provider]  Multiple Vitamin (MULTIVITAMIN) tablet Take 1 tablet by mouth daily.   Yes [provider]  nitroGLYCERIN (NITROSTAT) 0.4 MG SL tablet Place 1 tablet (0.4 mg total) under the tongue every 5 (five) minutes x 3 doses as needed for chest pain. 02/18/17  Yes Kilroy, Luke K, PA-C  potassium chloride SA (KLOR-CON M20) 20 MEQ tablet Take 1 tablet (20 mEq total) by mouth daily. (may take one extra tab when taking extra Lasix) 05/04/19  Yes Emokpae, Courage, MD  rosuvastatin (CRESTOR) 5 MG tablet Take 5 mg by mouth daily. 12/22/18  Yes [provider]  vitamin B-12 (CYANOCOBALAMIN) 250 MCG tablet Take 250 mcg by mouth daily.   Yes [provider]    Current Facility-Administered Medications  Medication Dose Route Frequency Provider Last Rate Last Admin  . 0.9 %  sodium chloride infusion  250 mL Intravenous PRN  Levie Heritage, DO 10 mL/hr at 05/21/19 0706 250 mL at 05/21/19 0706  . acetaminophen (TYLENOL) tablet 650 mg  650 mg Oral Q4H PRN Levie Heritage, DO   650 mg at 05/19/19 0959  . albuterol (PROVENTIL) (2.5 MG/3ML) 0.083% nebulizer solution 2.5 mg  2.5 mg Nebulization Q6H PRN Levie Heritage, DO      . ALPRAZolam Prudy Feeler) tablet 0.25 mg  0.25 mg Oral TID PRN Shon Hale, MD   0.25 mg at 05/20/19 1628  . amiodarone (NEXTERONE PREMIX) 360-4.14 MG/200ML-% (1.8 mg/mL) IV infusion  60 mg/hr Intravenous Continuous Dyann Kief, PA-C 33.3 mL/hr at 05/21/19 0951 60 mg/hr at 05/21/19 0951   Followed by  . amiodarone (NEXTERONE PREMIX) 360-4.14 MG/200ML-% (1.8 mg/mL) IV infusion  30 mg/hr Intravenous Continuous Dyann Kief, PA-C      . Chlorhexidine Gluconate Cloth 2 % PADS 6 each  6 each Topical Daily Shon Hale, MD   6 each at 05/21/19 0940  . feeding supplement (ENSURE ENLIVE) (ENSURE ENLIVE) liquid  237 mL  237 mL Oral BID BM Levie Heritage, DO   237 mL at 05/20/19 1351  . [START ON 05/22/2019] furosemide (LASIX) injection 40 mg  40 mg Intravenous Daily Emokpae, Courage, MD      . isosorbide dinitrate (ISORDIL) tablet 5 mg  5 mg Oral BID Mariea Clonts, Courage, MD   5 mg at 05/20/19 2235  . methocarbamol (ROBAXIN) tablet 500 mg  500 mg Oral BID Shon Hale, MD   500 mg at 05/20/19 2235  . metoprolol tartrate (LOPRESSOR) injection 5 mg  5 mg Intravenous Q6H Emokpae, Courage, MD      . nicotine (NICODERM CQ - dosed in mg/24 hours) patch 14 mg  14 mg Transdermal Daily Emokpae, Courage, MD   14 mg at 05/21/19 0955  . nitroGLYCERIN (NITROSTAT) SL tablet 0.4 mg  0.4 mg Sublingual Q5 min PRN Renda Rolls Z, DO      . rosuvastatin (CRESTOR) tablet 5 mg  5 mg Oral Daily Levie Heritage, DO   5 mg at 05/20/19 1610  . sodium chloride flush (NS) 0.9 % injection 3 mL  3 mL Intravenous Q12H Levie Heritage, DO   3 mL at 05/21/19 1000  . sodium chloride flush (NS) 0.9 % injection 3 mL  3 mL  Intravenous PRN Levie Heritage, DO   3 mL at 05/19/19 2143    Allergies as of 05/18/2019  . (No Known Allergies)    Family History  Problem Relation Age of Onset  . Heart attack Mother   . Hypertension Father     Social History   Socioeconomic History  . Marital status: Married    Spouse name: Not on file  . Number of children: Not on file  . Years of education: Not on file  . Highest education level: Not on file  Occupational History  . Not on file  Tobacco Use  . Smoking status: Current Every Day Smoker    Packs/day: 1.00    Years: 50.00    Pack years: 50.00    Types: Cigarettes    Last attempt to quit: 02/22/2017    Years since quitting: 2.2  . Smokeless tobacco: Never Used  . Tobacco comment: 1/2 pack per day 05/14/19  Substance and Sexual Activity  . Alcohol use: No  . Drug use: No  . Sexual activity: Never  Other Topics Concern  . Not on file  Social History Narrative  . Not on file   Social Determinants of Health   Financial Resource Strain:   . Difficulty of Paying Living Expenses:   Food Insecurity:   . Worried About Programme researcher, broadcasting/film/video in the Last Year:   . Barista in the Last Year:   Transportation Needs:   . Freight forwarder (Medical):   Marland Kitchen Lack of Transportation (Non-Medical):   Physical Activity:   . Days of Exercise per Week:   . Minutes of Exercise per Session:   Stress:   . Feeling of Stress :   Social Connections:   . Frequency of Communication with Friends and Family:   . Frequency of Social Gatherings with Friends and Family:   . Attends Religious Services:   . Active Member of Clubs or Organizations:   . Attends Banker Meetings:   Marland Kitchen Marital Status:   Intimate Partner Violence:   . Fear of Current or Ex-Partner:   . Emotionally Abused:   Marland Kitchen Physically Abused:   . Sexually Abused:  Review of Systems: Unable to obtain due to patient status  Physical Exam: Vital signs in last 24 hours: Temp:   [97.3 F (36.3 C)-98.8 F (37.1 C)] 97.3 F (36.3 C) (05/10 0656) Pulse Rate:  [35-158] 158 (05/10 0818) Resp:  [18-20] 20 (05/10 0656) BP: (81-124)/(58-92) 94/63 (05/10 0818) SpO2:  [94 %-100 %] 100 % (05/10 0818) Last BM Date: 05/19/19 General:   Acutely ill, frail, eyes closed but with obvious increased work of breathing Lungs:  Bilateral rales Heart:  Rapid rate in 130s Abdomen:  Soft, nontender and nondistended. No masses, hepatosplenomegaly or hernias noted. Normal bowel sounds, without guarding, and without rebound.   Rectal:  Deferred  Msk:  With kyphosis  Pulses:  Normal pulses noted. Extremities:  With pedal edema Neurologic:  Unable to assess   Intake/Output from previous day: 05/09 0701 - 05/10 0700 In: 603.7 [P.O.:600; I.V.:3.7] Out: 800 [Urine:800] Intake/Output this shift: Total I/O In: 6 [I.V.:6] Out: -   Lab Results: Recent Labs    05/18/19 1420 05/21/19 0733 05/21/19 1007  WBC 11.0* 12.5*  --   HGB 13.9 10.6* 8.4*  HCT 42.0 33.0* 26.8*  PLT 267 258  --    BMET Recent Labs    05/20/19 0621 05/21/19 0407 05/21/19 0733  NA 127* 129* 128*  K 5.9* 4.0 4.1  CL 89* 90* 89*  CO2 25 27 29   GLUCOSE 126* 124* 116*  BUN 30* 29* 30*  CREATININE 1.19* 1.14* 1.14*  CALCIUM 8.7* 7.9* 8.1*   LFT Recent Labs    05/18/19 1420 05/21/19 0733  PROT 6.2* 5.5*  ALBUMIN 3.1* 2.4*  AST 37 61*  ALT 34 29  ALKPHOS 80 70  BILITOT 1.0 1.1   PT/INR Recent Labs    05/21/19 0733  LABPROT 14.0  INR 1.1    Impression/Plan: 74 year old female admitted with acute on chronic heart failure, now with NSTEMI, aflutter with RVR on amiodarone, GI bleeding overnight after starting heparin, with last episode small amount this morning. Acute blood loss anemia with Hgb most recently 8.4 as of 10am this morning, previously 10.6 around 730. On admission in 13 range. Prior EGD in Jan 2021 with shallow ulcerations of antrum and prepyloric region in setting of aspirin,  Plavix, aspirin powders. No prior colonoscopy.  After consultation, we were notified that patient has now been made comfort care after palliative has spoken with family. DNR/DNI, no transfusions. Agree with palliative approach. Patient not a candidate for endoscopic procedures. GI will sign off at this time.   Feb 2021, PhD, ANP-BC [facility] Gastroenterology     LOS: 3 days    05/21/2019, 10:50 AM

## 2019-05-21 NOTE — Progress Notes (Signed)
Patient HR remains in the 140's. Night time dose of PO Cardizem and Apresoline given. Will recheck HR and BP within 1 hour.

## 2019-05-21 NOTE — Progress Notes (Signed)
MEDICATION RELATED CONSULT NOTE - FOLLOW UP   Pharmacy Consult for amiodarone interactions   No Known Allergies  Patient Measurements: Height: 5' (152.4 cm) Weight: (patient confused, sitting up in chair. Did not get weight.) IBW/kg (Calculated) : 45.5   Vital Signs: Temp: 97.3 F (36.3 C) (05/10 0656) Temp Source: Oral (05/10 0656) BP: 94/63 (05/10 0818) Pulse Rate: 158 (05/10 0818) Intake/Output from previous day: 05/09 0701 - 05/10 0700 In: 603.7 [P.O.:600; I.V.:3.7] Out: 800 [Urine:800] Intake/Output from this shift: Total I/O In: 6 [I.V.:6] Out: -   Labs: Recent Labs    05/18/19 1420 05/19/19 0700 05/20/19 0621 05/21/19 0407 05/21/19 0733 05/21/19 1007  WBC 11.0*  --   --   --  12.5*  --   HGB 13.9  --   --   --  10.6* 8.4*  HCT 42.0  --   --   --  33.0* 26.8*  PLT 267  --   --   --  258  --   APTT  --   --   --   --  28  --   CREATININE 1.16*   < > 1.19* 1.14* 1.14*  --   ALBUMIN 3.1*  --   --   --  2.4*  --   PROT 6.2*  --   --   --  5.5*  --   AST 37  --   --   --  61*  --   ALT 34  --   --   --  29  --   ALKPHOS 80  --   --   --  70  --   BILITOT 1.0  --   --   --  1.1  --    < > = values in this interval not displayed.   Estimated Creatinine Clearance: 27.1 mL/min (A) (by C-G formula based on SCr of 1.14 mg/dL (H)).   Microbiology: Recent Results (from the past 720 hour(s))  Respiratory Panel by RT PCR (Flu A&B, Covid) - Nasopharyngeal Swab     Status: None   Collection Time: 05/02/19 10:00 PM   Specimen: Nasopharyngeal Swab  Result Value Ref Range Status   SARS Coronavirus 2 by RT PCR NEGATIVE NEGATIVE Final    Comment: (NOTE) SARS-CoV-2 target nucleic acids are NOT DETECTED. The SARS-CoV-2 RNA is generally detectable in upper respiratoy specimens during the acute phase of infection. The lowest concentration of SARS-CoV-2 viral copies this assay can detect is 131 copies/mL. A negative result does not preclude SARS-Cov-2 infection and  should not be used as the sole basis for treatment or other patient management decisions. A negative result may occur with  improper specimen collection/handling, submission of specimen other than nasopharyngeal swab, presence of viral mutation(s) within the areas targeted by this assay, and inadequate number of viral copies (<131 copies/mL). A negative result must be combined with clinical observations, patient history, and epidemiological information. The expected result is Negative. Fact Sheet for Patients:  PinkCheek.be Fact Sheet for Healthcare Providers:  GravelBags.it This test is not yet ap proved or cleared by the [place] FDA and  has been authorized for detection and/or diagnosis of SARS-CoV-2 by FDA under an Emergency Use Authorization (EUA). This EUA will remain  in effect (meaning this test can be used) for the duration of the COVID-19 declaration under Section 564(b)(1) of the Act, 21 U.S.C. section 360bbb-3(b)(1), unless the authorization is terminated or revoked sooner.    Influenza A by PCR NEGATIVE  NEGATIVE Final   Influenza B by PCR NEGATIVE NEGATIVE Final    Comment: (NOTE) The Xpert Xpress SARS-CoV-2/FLU/RSV assay is intended as an aid in  the diagnosis of influenza from Nasopharyngeal swab specimens and  should not be used as a sole basis for treatment. Nasal washings and  aspirates are unacceptable for Xpert Xpress SARS-CoV-2/FLU/RSV  testing. Fact Sheet for Patients: https://www.moore.com/ Fact Sheet for Healthcare Providers: https://www.young.biz/ This test is not yet approved or cleared by the [place] FDA and  has been authorized for detection and/or diagnosis of SARS-CoV-2 by  FDA under an Emergency Use Authorization (EUA). This EUA will remain  in effect (meaning this test can be used) for the duration of the  Covid-19 declaration under Section  564(b)(1) of the Act, 21  U.S.C. section 360bbb-3(b)(1), unless the authorization is  terminated or revoked. Performed at [facility], [address], [place], [place] [address]   Respiratory Panel by RT PCR (Flu A&B, Covid) - Nasopharyngeal Swab     Status: None   Collection Time: 05/18/19  6:48 PM   Specimen: Nasopharyngeal Swab  Result Value Ref Range Status   SARS Coronavirus 2 by RT PCR NEGATIVE NEGATIVE Final    Comment: (NOTE) SARS-CoV-2 target nucleic acids are NOT DETECTED. The SARS-CoV-2 RNA is generally detectable in upper respiratoy specimens during the acute phase of infection. The lowest concentration of SARS-CoV-2 viral copies this assay can detect is 131 copies/mL. A negative result does not preclude SARS-Cov-2 infection and should not be used as the sole basis for treatment or other patient management decisions. A negative result may occur with  improper specimen collection/handling, submission of specimen other than nasopharyngeal swab, presence of viral mutation(s) within the areas targeted by this assay, and inadequate number of viral copies (<131 copies/mL). A negative result must be combined with clinical observations, patient history, and epidemiological information. The expected result is Negative. Fact Sheet for Patients:  https://www.moore.com/ Fact Sheet for Healthcare Providers:  https://www.young.biz/ This test is not yet ap proved or cleared by the [place] FDA and  has been authorized for detection and/or diagnosis of SARS-CoV-2 by FDA under an Emergency Use Authorization (EUA). This EUA will remain  in effect (meaning this test can be used) for the duration of the COVID-19 declaration under Section 564(b)(1) of the Act, 21 U.S.C. section 360bbb-3(b)(1), unless the authorization is terminated or revoked sooner.    Influenza A by PCR NEGATIVE NEGATIVE Final   Influenza B by PCR NEGATIVE NEGATIVE Final     Comment: (NOTE) The Xpert Xpress SARS-CoV-2/FLU/RSV assay is intended as an aid in  the diagnosis of influenza from Nasopharyngeal swab specimens and  should not be used as a sole basis for treatment. Nasal washings and  aspirates are unacceptable for Xpert Xpress SARS-CoV-2/FLU/RSV  testing. Fact Sheet for Patients: https://www.moore.com/ Fact Sheet for Healthcare Providers: https://www.young.biz/ This test is not yet approved or cleared by the [place] FDA and  has been authorized for detection and/or diagnosis of SARS-CoV-2 by  FDA under an Emergency Use Authorization (EUA). This EUA will remain  in effect (meaning this test can be used) for the duration of the  Covid-19 declaration under Section 564(b)(1) of the Act, 21  U.S.C. section 360bbb-3(b)(1), unless the authorization is  terminated or revoked. Performed at [facility], [address] [place], [place] [address]     Medications:  Medications Prior to Admission  Medication Sig Dispense Refill Last Dose  . acetaminophen (TYLENOL) 325 MG tablet  Take 2 tablets (650 mg total) by mouth every 6 (six) hours as needed for mild pain, fever or headache. 30 tablet 4   . albuterol (VENTOLIN HFA) 108 (90 Base) MCG/ACT inhaler Inhale 1-2 puffs into the lungs every 6 (six) hours as needed for wheezing or shortness of breath. 18 g 3 05/18/2019 at Unknown time  . ALPRAZolam (XANAX) 0.25 MG tablet Take 1 tablet (0.25 mg total) by mouth at bedtime as needed for anxiety. 30 tablet 0 05/17/2019 at Unknown time  . aspirin EC 81 MG tablet Take 1 tablet (81 mg total) by mouth daily with breakfast. 30 tablet 3 05/18/2019 at 0900  . carvedilol (COREG) 6.25 MG tablet Take 1 tablet (6.25 mg total) by mouth 2 (two) times daily. 60 tablet 6 05/18/2019 at 0900  . clopidogrel (PLAVIX) 75 MG tablet Take 1 tablet (75 mg total) by mouth daily. 30 tablet 5 05/18/2019 at 0900  . ferrous sulfate 325 (65 FE) MG tablet Take 1 tablet  (325 mg total) by mouth 2 (two) times daily with a meal. 90 tablet 3 05/18/2019 at Unknown time  . furosemide (LASIX) 40 MG tablet Take 1 tablet (40 mg total) by mouth daily. (may take extra as needed for weight gain of 3 pounds in 1 day or 5 pounds in 1 week) 100 tablet 3 05/18/2019 at Unknown time  . hydrALAZINE (APRESOLINE) 25 MG tablet Take 1 tablet (25 mg total) by mouth 3 (three) times daily. 270 tablet 3 05/18/2019 at Unknown time  . isosorbide dinitrate (ISORDIL) 5 MG tablet Take 5 mg by mouth 2 (two) times daily.   05/18/2019 at Unknown time  . Multiple Vitamin (MULTIVITAMIN) tablet Take 1 tablet by mouth daily.   05/18/2019 at Unknown time  . nitroGLYCERIN (NITROSTAT) 0.4 MG SL tablet Place 1 tablet (0.4 mg total) under the tongue every 5 (five) minutes x 3 doses as needed for chest pain. 25 tablet 3   . potassium chloride SA (KLOR-CON M20) 20 MEQ tablet Take 1 tablet (20 mEq total) by mouth daily. (may take one extra tab when taking extra Lasix) 100 tablet 3 05/18/2019 at Unknown time  . rosuvastatin (CRESTOR) 5 MG tablet Take 5 mg by mouth daily.   05/17/2019 at Unknown time  . vitamin B-12 (CYANOCOBALAMIN) 250 MCG tablet Take 250 mcg by mouth daily.   05/18/2019 at Unknown time    Assessment: Pharmacy consulted to determine if any drug-drug interactions in patient started on amiodarone.   Plan:  Possible drug-drug interaction with amiodarone and rosuvastatin but co-administration is acceptable.  Monitor transaminase levels and s/s of myopathy.  Susan [name] 05/21/2019,10:22 AM

## 2019-05-21 NOTE — Progress Notes (Signed)
Patient HR 79. MEWS is a 1. Dr Park Breed made aware.

## 2019-05-21 NOTE — Progress Notes (Signed)
ANTICOAGULATION CONSULT NOTE - Initial Consult  Pharmacy Consult for Heparin  Indication: chest pain/ACS and atrial flutter  No Known Allergies  Patient Measurements: Height: 5' (152.4 cm) Weight: (patient confused, sitting up in chair. Did not get weight.) IBW/kg (Calculated) : 45.5  Vital Signs: Temp: 97.8 F (36.6 C) (05/10 0054) Temp Source: Oral (05/10 0054) BP: 101/75 (05/10 0142) Pulse Rate: 38 (05/10 0142)  Labs: Recent Labs    05/18/19 1420 05/19/19 0700 05/20/19 0621 05/21/19 0044  HGB 13.9  --   --   --   HCT 42.0  --   --   --   PLT 267  --   --   --   CREATININE 1.16* 1.13* 1.19*  --   TROPONINIHS  --   --   --  2,766*    Estimated Creatinine Clearance: 25.9 mL/min (A) (by C-G formula based on SCr of 1.19 mg/dL (H)).   Medical History: Past Medical History:  Diagnosis Date  . Acute on chronic systolic and diastolic heart failure, NYHA class 1 (HCC) 11/21/2018  . Anxiety   . CAD (coronary artery disease)    a. s/p CABG x5 in 2000 b. cath in 02/2017 showing 2/5 patent grafts with patent LIMA-LAD, patent SVG-distal RCA, occluded SVG-D1 and occluded seq-SVG-OM1-OM2 with medical management recommended and possible PCI of distal RCA branches in the future if recurrent anginal symptoms  . CHF (congestive heart failure) (HCC)    a. EF 20-25% by echo in 12/2016 b. EF 45-50% by echo in 05/2017 c. EF 30% by echo in 11/2018  . Chronic bronchitis (HCC)   . CKD (chronic kidney disease) stage 3, GFR 30-59 ml/min 11/21/2018  . COPD (chronic obstructive pulmonary disease) (HCC)   . Dizziness   . Esophageal reflux   . Essential hypertension   . Fatigue   . HLD (hyperlipidemia) 11/21/2018  . HTN (hypertension) 11/21/2018  . Hypercholesteremia   . Hyperkalemia   . Ovarian failure   . SOB (shortness of breath)    Assessment: 74 y/o F with significant cardiac history with elevated troponin. Pt also atrial flutter. Planning to start heparin. CBC/renal function ok.  Low body weight.   Goal of Therapy:  Heparin level 0.3-0.7 units/ml Monitor platelets by anticoagulation protocol: Yes   Plan:  Will defer bolus with recent Lovenox injection Start heparin drip at 450 units/hr 1100 heparin level  Daily CBC/heparin level Monitor for bleeding  Abran Duke, PharmD, BCPS Clinical Pharmacist Phone: [phone]

## 2019-05-21 NOTE — Consult Note (Addendum)
Cardiology Consultation:   Patient ID: Susan [name] MRN: [id number]; DOB: [birth year]  Admit date: 05/18/2019 Date of Consult: 05/21/2019  Primary Care Provider: [name], MD Primary Cardiologist: Prentice Docker, MD  Primary Electrophysiologist:  None    Patient Profile:   Susan [name] is a 74 y.o. female with a hx of CAD, ICM EF 25-30%, GI bleed who is being seen today for the evaluation of Acute NSTEMI, CHF, rapid Aflutter at the request of Dr. Mariea Clonts.  History of Present Illness:   Susan [name] with history of CAD S/P CABG x 5 2000, most recent cath 11/20/2018 stable appearance coronary arteries, patent LIMA-LAD, 50%mSVG-dRCA, 90% ostial/pPDA  Similar to prior caths medical therapy. If symptoms recur, complex PCI to distal RCA bifurcation will need to be considered. GI bleed 01/2019 Telemedicine visit with Dr. Purvis Sheffield 03/15/19 metoprolol changed to Toprol, Plavix stopped and ASA only with GI bleed but she continued to take both and smoking cessation recommended. Admission 4/21 with CHF-we didn't see.Saw Susan [name] 05/14/19 with lower extremity with worsening edema improved with extra lasix.  Patient now admitted with acute CHF 05/18/19, BNP >4500 05/18/19. Last night developed chest pain, rapid Afib/flutter and started on IV heparin but developed blood stools with drop in Hgb 13.9-10.5 so heparin stopped and protonix drip started. EKG with rapid Aflutter 160/m with 3-4 mm ST depression ant/lat, troponins 2766, 6081, 7588. BNP >4500 05/18/19. Patient currently having chest pain, very short of breath and transferred to ICU. No good IV access.   Past Medical History:  Diagnosis Date  . Acute on chronic systolic and diastolic heart failure, NYHA class 1 (HCC) 11/21/2018  . Anxiety   . CAD (coronary artery disease)    a. s/p CABG x5 in 2000 b. cath in 02/2017 showing 2/5 patent grafts with patent LIMA-LAD, patent SVG-distal RCA, occluded SVG-D1 and occluded seq-SVG-OM1-OM2 with medical  management recommended and possible PCI of distal RCA branches in the future if recurrent anginal symptoms  . CHF (congestive heart failure) (HCC)    a. EF 20-25% by echo in 12/2016 b. EF 45-50% by echo in 05/2017 c. EF 30% by echo in 11/2018  . Chronic bronchitis (HCC)   . CKD (chronic kidney disease) stage 3, GFR 30-59 ml/min 11/21/2018  . COPD (chronic obstructive pulmonary disease) (HCC)   . Dizziness   . Esophageal reflux   . Essential hypertension   . Fatigue   . HLD (hyperlipidemia) 11/21/2018  . HTN (hypertension) 11/21/2018  . Hypercholesteremia   . Hyperkalemia   . Ovarian failure   . SOB (shortness of breath)     Past Surgical History:  Procedure Laterality Date  . ABDOMINAL HYSTERECTOMY    . APPENDECTOMY    . BIOPSY  01/22/2019   Procedure: BIOPSY;  Surgeon: Beverley Fiedler, MD;  Location: [facility] ENDOSCOPY;  Service: Gastroenterology;;  . BREAST BIOPSY Right   . CARDIAC CATHETERIZATION  2000  . CORONARY ARTERY BYPASS GRAFT  2000   CABG X5  . ESOPHAGOGASTRODUODENOSCOPY (EGD) WITH PROPOFOL N/A 01/22/2019   Procedure: ESOPHAGOGASTRODUODENOSCOPY (EGD) WITH PROPOFOL;  Surgeon: Beverley Fiedler, MD;  Location: [facility] ENDOSCOPY;  Service: Gastroenterology;  Laterality: N/A;  . LEFT HEART CATH AND CORS/GRAFTS ANGIOGRAPHY N/A 02/17/2017   Procedure: LEFT HEART CATH AND CORS/GRAFTS ANGIOGRAPHY;  Surgeon: Kathleene Hazel, MD;  Location: [facility] INVASIVE CV LAB;  Service: Cardiovascular;  Laterality: N/A;  . LEFT HEART CATH AND CORS/GRAFTS ANGIOGRAPHY N/A 11/20/2018   Procedure: LEFT HEART CATH AND CORS/GRAFTS ANGIOGRAPHY;  Surgeon: End,  Cristal Deer, MD;  Location: [facility] INVASIVE CV LAB;  Service: Cardiovascular;  Laterality: N/A;     Home Medications:  Prior to Admission medications   Medication Sig Start Date End Date Taking? Authorizing Provider  acetaminophen (TYLENOL) 325 MG tablet Take 2 tablets (650 mg total) by mouth every 6 (six) hours as needed for mild pain, fever or headache. 05/04/19   Yes Emokpae, Courage, MD  albuterol (VENTOLIN HFA) 108 (90 Base) MCG/ACT inhaler Inhale 1-2 puffs into the lungs every 6 (six) hours as needed for wheezing or shortness of breath. 05/04/19  Yes Emokpae, Courage, MD  ALPRAZolam Prudy Feeler) 0.25 MG tablet Take 1 tablet (0.25 mg total) by mouth at bedtime as needed for anxiety. 05/04/19 05/03/20 Yes Shon Hale, MD  aspirin EC 81 MG tablet Take 1 tablet (81 mg total) by mouth daily with breakfast. 05/04/19  Yes Emokpae, Courage, MD  carvedilol (COREG) 6.25 MG tablet Take 1 tablet (6.25 mg total) by mouth 2 (two) times daily. 05/14/19  Yes Netta Neat., NP  clopidogrel (PLAVIX) 75 MG tablet Take 1 tablet (75 mg total) by mouth daily. 05/04/19  Yes Shon Hale, MD  ferrous sulfate 325 (65 FE) MG tablet Take 1 tablet (325 mg total) by mouth 2 (two) times daily with a meal. 01/24/19  Yes Arnetha Courser, MD  furosemide (LASIX) 40 MG tablet Take 1 tablet (40 mg total) by mouth daily. (may take extra as needed for weight gain of 3 pounds in 1 day or 5 pounds in 1 week) 12/11/18  Yes Laqueta Linden, MD  hydrALAZINE (APRESOLINE) 25 MG tablet Take 1 tablet (25 mg total) by mouth 3 (three) times daily. 05/04/19  Yes Emokpae, Courage, MD  isosorbide dinitrate (ISORDIL) 5 MG tablet Take 5 mg by mouth 2 (two) times daily.   Yes [provider]  Multiple Vitamin (MULTIVITAMIN) tablet Take 1 tablet by mouth daily.   Yes [provider]  nitroGLYCERIN (NITROSTAT) 0.4 MG SL tablet Place 1 tablet (0.4 mg total) under the tongue every 5 (five) minutes x 3 doses as needed for chest pain. 02/18/17  Yes Kilroy, Luke K, PA-C  potassium chloride SA (KLOR-CON M20) 20 MEQ tablet Take 1 tablet (20 mEq total) by mouth daily. (may take one extra tab when taking extra Lasix) 05/04/19  Yes Emokpae, Courage, MD  rosuvastatin (CRESTOR) 5 MG tablet Take 5 mg by mouth daily. 12/22/18  Yes [provider]  vitamin B-12 (CYANOCOBALAMIN) 250 MCG tablet Take  250 mcg by mouth daily.   Yes [provider]    Inpatient Medications: Scheduled Meds: . aspirin EC  81 mg Oral Q breakfast  . Chlorhexidine Gluconate Cloth  6 each Topical Daily  . feeding supplement (ENSURE ENLIVE)  237 mL Oral BID BM  . furosemide  40 mg Intravenous BID  . isosorbide dinitrate  5 mg Oral BID  . methocarbamol  500 mg Oral BID  . metoprolol tartrate  5 mg Intravenous Q6H  . nicotine  14 mg Transdermal Daily  . rosuvastatin  5 mg Oral Daily  . sodium chloride flush  3 mL Intravenous Q12H   Continuous Infusions: . sodium chloride 250 mL (05/21/19 0706)  . pantoprazole (PROTONIX) IV     PRN Meds: sodium chloride, acetaminophen, albuterol, ALPRAZolam, nitroGLYCERIN, sodium chloride flush  Allergies:   No Known Allergies  Social History:   Social History   Socioeconomic History  . Marital status: Married    Spouse name: Not on file  .  Number of children: Not on file  . Years of education: Not on file  . Highest education level: Not on file  Occupational History  . Not on file  Tobacco Use  . Smoking status: Current Every Day Smoker    Packs/day: 1.00    Years: 50.00    Pack years: 50.00    Types: Cigarettes    Last attempt to quit: 02/22/2017    Years since quitting: 2.2  . Smokeless tobacco: Never Used  . Tobacco comment: 1/2 pack per day 05/14/19  Substance and Sexual Activity  . Alcohol use: No  . Drug use: No  . Sexual activity: Never  Other Topics Concern  . Not on file  Social History Narrative  . Not on file   Social Determinants of Health   Financial Resource Strain:   . Difficulty of Paying Living Expenses:   Food Insecurity:   . Worried About Programme researcher, broadcasting/film/video in the Last Year:   . Barista in the Last Year:   Transportation Needs:   . Freight forwarder (Medical):   Marland Kitchen Lack of Transportation (Non-Medical):   Physical Activity:   . Days of Exercise per Week:   . Minutes of Exercise per Session:   Stress:     . Feeling of Stress :   Social Connections:   . Frequency of Communication with Friends and Family:   . Frequency of Social Gatherings with Friends and Family:   . Attends Religious Services:   . Active Member of Clubs or Organizations:   . Attends Banker Meetings:   Marland Kitchen Marital Status:   Intimate Partner Violence:   . Fear of Current or Ex-Partner:   . Emotionally Abused:   Marland Kitchen Physically Abused:   . Sexually Abused:     Family History:    v Family History  Problem Relation Age of Onset  . Heart attack Mother   . Hypertension Father      ROS:  Please see the history of present illness.  Review of Systems  Constitution: Negative.  HENT: Negative.   Eyes: Negative.   Cardiovascular: Positive for chest pain.  Respiratory: Positive for shortness of breath.   Hematologic/Lymphatic: Negative.   Musculoskeletal: Negative.  Negative for joint pain.  Gastrointestinal: Negative.   Genitourinary: Negative.   Neurological: Negative.     All other ROS reviewed and negative.     Physical Exam/Data:   Vitals:   05/21/19 0302 05/21/19 0403 05/21/19 0656 05/21/19 0818  BP: 97/85 (!) 81/58 108/75 94/63  Pulse: (!) 148 71 85 (!) 158  Resp: Temp: 97.8 F (36.6 C) 97.6 F (36.4 C) (!) 97.3 F (36.3 C)   TempSrc: Oral Oral Oral   SpO2: 94% 95% 97% 100%  Weight:      Height:        Intake/Output Summary (Last 24 hours) at 05/21/2019 0919 Last data filed at 05/21/2019 0400 Gross per 24 hour  Intake 363.71 ml  Output 600 ml  Net -236.29 ml   Last 3 Weights 05/20/2019 05/18/2019 05/14/2019  Weight (lbs) (No Data) 88 lb 10 oz 89 lb 3.2 oz  Weight (kg) (No Data) 40.2 kg 40.461 kg     Body mass index is 17.31 kg/m.  General:  Thin, acutely ill, short of breath, RN's trying to get IV access HEENT: normal Lymph: no adenopathy Neck: increased JVD Endocrine:  No thryomegaly Vascular: No carotid bruits; FA pulses 2+ bilaterally  without bruits  Cardiac: rapid  rate 160/m distant HS Lungs:  rales at bases Abd: soft, nontender, no hepatomegaly  Ext: no edema Musculoskeletal:  No deformities, BUE and BLE strength normal and equal Skin: warm and dry  Neuro:  CNs 2-12 intact, no focal abnormalities noted Psych:  Acutely short of breath  EKG:  The EKG was personally reviewed and demonstrates:  Aflutter 160/m with LVH, marked 3-4 mm ST depression ant/lat-new  Telemetry:  Telemetry was personally reviewed and demonstrates:  Rapid aflutter  Relevant CV Studies:  Echocardiogram 01/19/2019:   1. Left ventricular ejection fraction, by visual estimation, is 25 to  30%. The left ventricle has severely decreased function. Left ventricular  septal wall thickness was normal.   2. Left ventricular diastolic function could not be evaluated.   3. Severely dilated left ventricular internal cavity size.   4. Findings consistent with multivessel CAD with prior inferior and  anterior (Septal /apical) wall infarcts findings similar to those on echo  November 2020.   5. Global right ventricle has normal systolc function.The right  ventricular size is normal. no increase in right ventricular wall  thickness.   6. Left atrial size was mildly dilated.   7. The tricuspid valve was not assessed.   8. Mild aortic valve sclerosis without stenosis.   9. Aortic root could not be assessed.  10. ? prominent chiarri network at dome of RA in 4 chamber view.      11/20/18 Cardiac cath  Conclusions: 1. Overall stable appearance of the coronary arteries with severe three-vessel coronary artery disease, including chronic total occlusions of the mid LAD, mid LCx, and proximal RCA. 2. Widely patent LIMA-LAD. 3. Patent SVG-distal RCA with up to 50% stenosis in the mid graft.  Bifurcation disease involving the distal RCA is similar to prior catheterization, with 90% lesions involving the ostial/proximal rPDA and ostial/proximal RCA continuation. 4. Normal left ventricular filling  pressure.   Recommendations: 1. Medical optimization, including escalation of evidence-based heart failure therapy, improved blood pressure control, and smoking cessation. 2. Dual antiplatelet therapy with aspirin and clopidogrel for at least 12 months, if tolerated. 3. If symptoms recur, complex PCI to the distal RCA bifurcation will need to be considered.  As previously noted, this may requiring "kissing stents" at the ostia of the rPDA and RCA continuation.  This would be a potentially high risk procedure, given the territory supplied (via native vessels and collaterals).   Laboratory Data:  High Sensitivity Troponin:   Recent Labs  Lab 05/02/19 1841 05/02/19 2025 05/21/19 0044 05/21/19 0407 05/21/19 0634  TROPONINIHS 49* 47* 2,766* 6,081* 7,588*     Chemistry Recent Labs  Lab 05/20/19 0621 05/21/19 0407 05/21/19 0733  NA 127* 129* 128*  K 5.9* 4.0 4.1  CL 89* 90* 89*  CO2 25 27 29   GLUCOSE 126* 124* 116*  BUN 30* 29* 30*  CREATININE 1.19* 1.14* 1.14*  CALCIUM 8.7* 7.9* 8.1*  GFRNONAA 45* 47* 47*  GFRAA 52* 54* 54*  ANIONGAP 13 12 10     Recent Labs  Lab 05/18/19 1420 05/21/19 0733  PROT 6.2* 5.5*  ALBUMIN 3.1* 2.4*  AST 37 61*  ALT 34 29  ALKPHOS 80 70  BILITOT 1.0 1.1   Hematology Recent Labs  Lab 05/18/19 1420 05/21/19 0733  WBC 11.0* 12.5*  RBC 4.10 3.16*  HGB 13.9 10.6*  HCT 42.0 33.0*  MCV 102.4* 104.4*  MCH 33.9 33.5  MCHC 33.1 32.1  RDW 13.7 14.0  PLT 267  258   BNP Recent Labs  Lab 05/18/19 1421  BNP >4,500.0*    DDimer  Recent Labs  Lab 05/18/19 1420  DDIMER 1.69*     Radiology/Studies:  DG Chest Port 1 View  Result Date: 05/18/2019 CLINICAL DATA:  Shortness of breath. EXAM: PORTABLE CHEST 1 VIEW COMPARISON:  05/02/2018 FINDINGS: Prior median sternotomy and scratched it prior CABG. Stable severe cardiomegaly. Prominent right lower lobe atelectasis/infiltrate. Small right pleural effusion. No pneumothorax. Degenerative change  thoracic spine. IMPRESSION: 1.  Prior CABG.  Stable severe cardiomegaly. 2. Prominent right lower lobe atelectasis/infiltrate. Small right pleural effusion. Electronically Signed   By: Marcello Moores  Register   On: 05/18/2019 14:11       TIMI Risk Score for Unstable Angina or Non-ST Elevation MI:   The patient's TIMI risk score is 7, which indicates a 41% risk of all cause mortality, new or recurrent myocardial infarction or need for urgent revascularization in the next 14 days.   Assessment and Plan:   1. NSTEMI with troponins >7000, ST depression on EKG 2. Acute on chronic systolic CHF 3. CAD s/p CABG 2000, last cath 11/2018 consider complex PCI if recurrent pain but GI bleed 01/2019 and again last night. Plavix was supposed to be stopped 03/2019 but she was still taking on admission 4. Atrial flutter with RVR 160/m-new for patient. Starting IV Amio bolus/drip 5. Recurrent GI bleed overnight when heparin started-no off heparin  6.  DNR/DNI-husband agreeable after speaking with Dr. Bronson Ing      For questions or updates, please contact [place] Please consult [email or web address] for contact info under     Signed, Ermalinda Barrios, PA-C  05/21/2019 9:19 AM   The patient was seen and examined, and I agree with the history, physical exam, assessment and plan as documented above, with modifications made above and as noted below. I have also personally reviewed all relevant documentation, old records, labs, and both radiographic and cardiovascular studies. I have also independently interpreted old and new ECG's.  The patient is critically ill with high risk for imminent death.  The patient was seen with Susan Scale PA-C and the patient's husband and several members of the nursing staff were present.  See above for specific details.  She is currently in rapid atrial flutter with a heart rate in the 160 bpm range.  ECG demonstrates significant and diffuse ST segment depressions and with troponin  elevation in the 7.5 k range overall presentation consistent with acute coronary syndrome.  Nursing staff was unable to establish IV access until a few minutes ago and were thus unable to provide IV medications to slow down heart rate.  I recommended IV amiodarone bolus of 150 mg with maintenance infusion.  Blood pressure is in the 70/40 range.  Will withhold IV metoprolol and IV Lasix for this reason.  She had been on IV heparin last night but hemoglobin has dropped from 13 range to 10 range so this was stopped as well.  Of note, when I evaluated her in March 2021 I recommended that clopidogrel be stopped and she remain on aspirin alone but when she was hospitalized in April 2021 she was on both aspirin and clopidogrel.  I had a long discussion with the patient's husband whom I know well.  He has decided to change CODE STATUS from full to DNR/DNI.  This was also discussed with Dr. Denton Brick (hospitalist).  I would recommend a family discussion with palliative care.  Kate Sable, MD, [facility]  Time spent: 71  minutes  05/21/2019 9:34 AM

## 2019-05-21 NOTE — Progress Notes (Signed)
Patient HR 147. Mews score 4 due to heart rate. No complaints of SOB, chest pain, or dizziness. Patient has been awake most of the night. Dr Welton Flakes made aware. New order for one time dose of 10mg  IV Cardizem and 6 mg melatonin.

## 2019-05-21 NOTE — Progress Notes (Signed)
Patient has a critical troponin of 6081. No complaints at this time. Patient requested to use the bedpan. Moderate amount of dark blood in stool. Heparin drip stopped. Dr Welton Flakes notified. New order for 80mg  IV protonix.

## 2019-05-21 NOTE — Progress Notes (Signed)
Patient heart rate 144. No complaints of chest pain, SOB, or dizziness. Dr Welton Flakes notified. New order for one time dose of 10mg  IV Cardizem.

## 2019-05-21 NOTE — Progress Notes (Signed)
Patient Demographics:    Susan [name], is a 74 y.o. female, DOB - 1945-06-14, DXA:[id number]  Admit date - 05/18/2019   Admitting Physician Nicci Vaughan Mariea Clonts, MD  Outpatient Primary MD for the patient is [name], MD  LOS - 3   Chief Complaint  Patient presents with  . Shortness of Breath        Subjective:    Susan [name] today had experienced chest pains and increased shortness of breath  Husband and daughter at bedside  --Developed chest pain on 05/20/2019 with EKG ST segment changes/depression and troponin elevation above 7,500, was started on IV heparin on 05/20/2019 but developed GI bleed -Family requested comfort care at this time    Assessment  & Plan :    Principal Problem:   NSTEMI (non-ST elevated myocardial infarction) [facility]) Active Problems:   Palliative care encounter   Anxiety attack/Panic Attack/Insomnia   Goals of care, counseling/discussion   Ischemic cardiomyopathy   Hx of CABG   Pulmonary hypertension (HCC)   Acute on chronic systolic and diastolic heart failure, NYHA class 1 (HCC)   HTN (hypertension)   Failure to thrive in adult   Hyponatremia   Dementia without behavioral disturbance (HCC)  Brief Summary:- 74 y.o.femalewith medical history significant ofCAD (s/p CABG x5 in 2000 with cath in11/2020showing 2/5 patent grafts with patent LIMA-LAD, patent SVG-distal RCA, occluded SVG-D1 and occluded seq-SVG-OM1-OM2 with medical management recommended, repeat cath in 11/2018 showing stable anatomy with patent LIMA-LAD and patent SVG-dRCA with 50% mid-graft stenosis and medical therapy recommended), chronic combined systolic and diastolic (EF 20-25% by echo in 12/2016, at 45-50% by echo in 05/2017,EF 25 to30% by repeat echo in 01/19/19), HTN, HLD, COPD / emphysema, tobacco useadmitted on 05/18/19 with worsening dyspnea and worsening LE edema and concerns about CHF exacer  and Rt LL PNA--- ---  BNP over 4500 and clinical findings consistent with CHF exacerbation -Developed chest pain on 05/20/2019 with EKG ST segment changes/depression and troponin elevation above 7,500, was started on IV heparin on 05/20/2019 but developed GI bleed -Family requested comfort care at this time  A/p 1)HFrEF/chronic systolic dysfunction CHF nonischemic or myopathy ---last known EF 25 to 30% based on echo from January 2021---patient failed oral diuretics at home PTA -BNP > 4,500 (was only 496 on 01/18/19) -  -Developed chest pain on 05/20/2019 with EKG ST segment changes/depression and troponin elevation above 7,500, was started on IV heparin on 05/20/2019 but developed GI bleed -Family requested comfort care at this time   2)COPD/emphysema/ongoing tobacco abuse---continue as needed bronchodilators =- Nicotine patch   3)CAD--status post prior CABG and prior angioplasty with stenting--- --Developed chest pain on 05/20/2019 with EKG ST segment changes/depression and troponin elevation above 7,500, was started on IV heparin on 05/20/2019 but developed GI bleed -Family requested comfort care at this time  4)History of iron Deficiency Anemia--- had GI bleed, hemoglobin dropped to 8.4   5)Chronic Hyponatremia----comfort care  6)FTT in an Adult--- moderate PCM, serum albumin is 3.1--- Ensure twice daily advised along with Magic cup  7)???Rt LL Infiltrate--- -comfort care measures  8)Anxiety DO/Panic Attacks/Insomnia--- xanax 0.25 mg q 8 hrs prn  9)Social/Ethics--- given age, low EF and overall comorbid conditions--- overall prognosis is poor --Palliative care consult requested to  help further delineate goals of care --Developed chest pain on 05/20/2019 with EKG ST segment changes/depression and troponin elevation above 7,500, was started on IV heparin on 05/20/2019 but developed GI bleed -Family requested comfort care at this time  -Cardiology, palliative care and GI consults  appreciated  10)Dementia--patient with mild to moderate cognitive and memory deficits  Disposition/Need for in-Hospital Stay- patient unable to be discharged at this time due to -May transition to hospice in a.m. -Patient From: home D/C Place: Hospice facility versus  hospice at home  barriers: Not Clinically Stable-   Code Status : full  Family Communication:  (patient is alert, awake and coherent) -Discussed with husband and daughter at bedside  Consults  : Cardiology/GI/palliative care  DVT Prophylaxis  : Comfort care  Lab Results  Component Value Date   PLT 258 05/21/2019   Inpatient Medications  Scheduled Meds: . mouth rinse  15 mL Mouth Rinse BID  . methocarbamol  500 mg Oral BID  . nicotine  14 mg Transdermal Daily  . sodium chloride flush  3 mL Intravenous Q12H   Continuous Infusions: . sodium chloride Stopped (05/21/19 0752)   PRN Meds:.sodium chloride, acetaminophen, acetaminophen, albuterol, ALPRAZolam, glycopyrrolate, metoprolol tartrate, morphine injection, nitroGLYCERIN, sodium chloride flush   Anti-infectives (From admission, onward)   None       Objective:   Vitals:   05/21/19 1625 05/21/19 1629 05/21/19 1751 05/21/19 1800  BP:      Pulse:   68 71  Resp: 18     Temp:      TempSrc:      SpO2:  100%  100%  Weight:      Height:        Wt Readings from Last 3 Encounters:  05/18/19 40.2 kg  05/14/19 40.5 kg  05/03/19 40.6 kg    Intake/Output Summary (Last 24 hours) at 05/21/2019 1856 Last data filed at 05/21/2019 1629 Gross per 24 hour  Intake 93.35 ml  Output --  Net 93.35 ml   Physical Exam  Gen:- Awake Alert, dyspnea on exertion HEENT:- [place].AT, No sclera icterus Neck-Supple Neck,No JVD,.  Lungs-diminished in bases with bibasilar rales CV- S1, S2 normal, irregularly irregular,, prior sternotomy scar Abd-  +ve B.Sounds, Abd Soft, No tenderness,    Extremity/Skin:-2+ pitting edema, pedal pulses present  Psych-affect is anxious,,  baseline memory and cognitive deficits  neuro-generalized weakness, no new focal deficits, no tremors   Data Review:   Micro Results Recent Results (from the past 240 hour(s))  Respiratory Panel by RT PCR (Flu A&B, Covid) - Nasopharyngeal Swab     Status: None   Collection Time: 05/18/19  6:48 PM   Specimen: Nasopharyngeal Swab  Result Value Ref Range Status   SARS Coronavirus 2 by RT PCR NEGATIVE NEGATIVE Final    Comment: (NOTE) SARS-CoV-2 target nucleic acids are NOT DETECTED. The SARS-CoV-2 RNA is generally detectable in upper respiratoy specimens during the acute phase of infection. The lowest concentration of SARS-CoV-2 viral copies this assay can detect is 131 copies/mL. A negative result does not preclude SARS-Cov-2 infection and should not be used as the sole basis for treatment or other patient management decisions. A negative result may occur with  improper specimen collection/handling, submission of specimen other than nasopharyngeal swab, presence of viral mutation(s) within the areas targeted by this assay, and inadequate number of viral copies (<131 copies/mL). A negative result must be combined with clinical observations, patient history, and epidemiological information. The expected result is Negative. Fact  Sheet for Patients:  https://www.moore.com/ Fact Sheet for Healthcare Providers:  https://www.young.biz/ This test is not yet ap proved or cleared by the [place] FDA and  has been authorized for detection and/or diagnosis of SARS-CoV-2 by FDA under an Emergency Use Authorization (EUA). This EUA will remain  in effect (meaning this test can be used) for the duration of the COVID-19 declaration under Section 564(b)(1) of the Act, 21 U.S.C. section 360bbb-3(b)(1), unless the authorization is terminated or revoked sooner.    Influenza A by PCR NEGATIVE NEGATIVE Final   Influenza B by PCR NEGATIVE NEGATIVE Final     Comment: (NOTE) The Xpert Xpress SARS-CoV-2/FLU/RSV assay is intended as an aid in  the diagnosis of influenza from Nasopharyngeal swab specimens and  should not be used as a sole basis for treatment. Nasal washings and  aspirates are unacceptable for Xpert Xpress SARS-CoV-2/FLU/RSV  testing. Fact Sheet for Patients: https://www.moore.com/ Fact Sheet for Healthcare Providers: https://www.young.biz/ This test is not yet approved or cleared by the [place] FDA and  has been authorized for detection and/or diagnosis of SARS-CoV-2 by  FDA under an Emergency Use Authorization (EUA). This EUA will remain  in effect (meaning this test can be used) for the duration of the  Covid-19 declaration under Section 564(b)(1) of the Act, 21  U.S.C. section 360bbb-3(b)(1), unless the authorization is  terminated or revoked. Performed at [facility], [address] [place], [place] [address]    Radiology Reports DG Chest [place] 1 View  Result Date: 05/18/2019 CLINICAL DATA:  Shortness of breath. EXAM: PORTABLE CHEST 1 VIEW COMPARISON:  05/02/2018 FINDINGS: Prior median sternotomy and scratched it prior CABG. Stable severe cardiomegaly. Prominent right lower lobe atelectasis/infiltrate. Small right pleural effusion. No pneumothorax. Degenerative change thoracic spine. IMPRESSION: 1.  Prior CABG.  Stable severe cardiomegaly. 2. Prominent right lower lobe atelectasis/infiltrate. Small right pleural effusion. Electronically Signed   By: Maisie Fus  Register   On: 05/18/2019 14:11   DG Chest Portable 1 View  Result Date: 05/02/2019 CLINICAL DATA:  Short of breath, heart failure EXAM: PORTABLE CHEST 1 VIEW COMPARISON:  04/21/2019 FINDINGS: Single frontal view of the chest demonstrates persistent enlargement of the cardiac silhouette. Postsurgical changes from prior CABG. Background emphysema unchanged. There is chronic central vascular congestion without airspace disease,  effusion, or pneumothorax. No acute bony abnormality. IMPRESSION: 1. Stable enlarged cardiac silhouette. 2. Chronic central vascular congestion, no acute process. 3. Emphysema. Electronically Signed   By: Sharlet Salina M.D.   On: 05/02/2019 19:10    CBC Recent Labs  Lab 05/18/19 1420 05/21/19 0733 05/21/19 1007  WBC 11.0* 12.5*  --   HGB 13.9 10.6* 8.4*  HCT 42.0 33.0* 26.8*  PLT 267 258  --   MCV 102.4* 104.4*  --   MCH 33.9 33.5  --   MCHC 33.1 32.1  --   RDW 13.7 14.0  --   LYMPHSABS 1.0 0.7  --   MONOABS 0.7 1.1*  --   EOSABS 0.1 0.0  --   BASOSABS 0.0 0.0  --     Chemistries  Recent Labs  Lab 05/18/19 1420 05/19/19 0700 05/20/19 0621 05/21/19 0407 05/21/19 0733  NA 124* 128* 127* 129* 128*  K 4.5 5.0 5.9* 4.0 4.1  CL 85* 88* 89* 90* 89*  CO2 27 27 25 27 29   GLUCOSE 126* 131* 126* 124* 116*  BUN 20 23 30* 29* 30*  CREATININE 1.16* 1.13* 1.19* 1.14* 1.14*  CALCIUM 8.6* 8.6* 8.7* 7.9* 8.1*  AST 37  --   --   --  61*  ALT 34  --   --   --  29  ALKPHOS 80  --   --   --  70  BILITOT 1.0  --   --   --  1.1   ------------------------------------------------------------------------------------------------------------------ No results for input(s): CHOL, HDL, LDLCALC, TRIG, CHOLHDL, LDLDIRECT in the last 72 hours.  No results found for: HGBA1C ------------------------------------------------------------------------------------------------------------------ No results for input(s): TSH, T4TOTAL, T3FREE, THYROIDAB in the last 72 hours.  Invalid input(s): FREET3 ------------------------------------------------------------------------------------------------------------------ No results for input(s): VITAMINB12, FOLATE, FERRITIN, TIBC, IRON, RETICCTPCT in the last 72 hours.  Coagulation profile Recent Labs  Lab 05/21/19 0733  INR 1.1    No results for input(s): DDIMER in the last 72 hours.  Cardiac Enzymes No results for input(s): CKMB, TROPONINI, MYOGLOBIN in  the last 168 hours.  Invalid input(s): CK ------------------------------------------------------------------------------------------------------------------    Component Value Date/Time   BNP >4,500.0 (H) 05/18/2019 1421   Roxan Hockey M.D on 05/21/2019 at 6:56 PM  Go to [email or web address] - for contact info  [facility] - Office  [phone]

## 2019-05-21 NOTE — Consult Note (Signed)
Consultation Note Date: 05/21/2019   Patient Name: Susan [name]  DOB: 1945/07/20  MRN: [id number]  Age / Sex: 74 y.o., female  PCP: [name], MD Referring Physician: Roxan Hockey, MD  Reason for Consultation: Establishing goals of care  HPI/Patient Profile: 74 y.o. female  with past medical history of CAD, ICM EF 25-30%, CABG x5 in 2000, GI bleeding, HTN, HLD, COPD, anxiety admitted on 05/18/2019 with shortness of breath and worsening LE edema concerning for CHF exacerbation and RLL pneumonia. BNP over 4500. On 5/10 AM, patient with chest pain, afib/flutter and elevated troponin. Started on heparin gtt and developed bloody stools and drop in hemoglobin from 13.9 to 10.9. Protonix ordered but not started immediately due to loss of IV access. Transferred to ICU stepdown. Cardiology following. ST depression on EKG and elevated troponin consistent with acute coronary syndrome. Cardiac cath 11/20/18 with severe three vessel CAD and recommendation for medical management. Dr. Bronson Ing spoke with husband and decision made for DNR/DNI code status. Recommending palliative medicine consultation for goals of care.   Clinical Assessment and Goals of Care:  I have reviewed medical records, discussed with Dr. Denton Brick, cardiology PA, and RN, and met with patient and family (Husband and daughter) at bedside to discuss goals of care. Patient resting but halfway through conversation, she wakes and is able to participate. She appears to be comfortable and denies pain, dyspnea, or anxiety. HR afib RVR, amio gtt started. Soft BP's.  Introduced Palliative Medicine as specialized medical care for people living with serious illness. It focuses on providing relief from the symptoms and stress of a serious illness.   We discussed a brief life review of the patient. Married. One daughter. 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 Reviewed PMH including CABG in 2000. She did well for many years, but husband reports worsening health in the last 5 years. Still smoking prior to admission.   Discussed events leading up to admission and course of hospitalization including diagnoses, interventions, plan of care and unfortunate decline this AM requiring transfer to ICU. Husband and daughter appreciate conversation with Dr. Bronson Ing this morning and understand diagnoses and poor prognosis. Husband and daughter confirm decision for DNR/DNI code status following conversation with Dr. Bronson Ing.   Daughter shares their wish for her to be "comfortable." They do not want to see her suffer.   While at bedside, patient woke and daughter, [name] was able to ask [name] what her wishes are. The patient stated to make her comfortable and does seem to understand poor prognosis.   The difference between aggressive medical intervention and comfort care was considered in light of the patient's goals of care. Discussed transition to comfort, including focus on symptom management versus focus on vitals/numbers. Discussed symptom management medications.   Husband and daughter share that there are multiple family members that would wish to visit Susan [name] as she nears the end of her life, including siblings, grandchildren, and Theme park manager. Explained visitor policy and encouraged family members to come sooner than later  while she is awake and alert.   Questions and concerns were addressed.  Hard Choices and PMT contact information given. Reassured ongoing support from PMT provider through this journey.    SUMMARY OF RECOMMENDATIONS    Following conversations with cardiology this AM, patient and family understand diagnoses and poor prognosis.   Patient and family wish to focus on comfort and relief from suffering.  Transition to comfort measures. Discontinue interventions not aimed at comfort. No escalation of  care.  DNR/DNI  Symptom management--see below.  Comfort feeds per patient/family request.  Unrestricted visitor access.  Spiritual care consult.  PMT will follow inpatient and further discuss hospice options if appropriate.  Code Status/Advance Care Planning:  DNR  Symptom Management:   Morphine 1-27m IV q2h prn pain/dyspnea/air hunger/tachypnea  Xanax 0.267mPO QID prn anxiety  Robinul 0.36m78mV q4h prn secretions  PRN tylenol mild pain or fever  Palliative Prophylaxis:   Aspiration, Delirium Protocol, Frequent Pain Assessment, Oral Care and Turn Reposition  Psycho-social/Spiritual:   Desire for further Chaplaincy support: yes  Additional Recommendations: Caregiving  Support/Resources, Compassionate Wean Education and Education on Hospice  Prognosis:   Poor prognosis   Discharge Planning: To Be Determined : hospital death vs. Hospice if appropriate     Primary Diagnoses: Present on Admission: . HTN (hypertension) . Ischemic cardiomyopathy . Pulmonary hypertension ([facility] [place] Acute on chronic systolic and diastolic heart failure, NYHA class 1 ([facility] Failure to thrive in adult . Hyponatremia . Dementia without behavioral disturbance ([facility] Anxiety attack/Panic Attack/Insomnia . NSTEMI (non-ST elevated myocardial infarction) ([place] I have reviewed the medical record, interviewed the patient and family, and examined the patient. The following aspects are pertinent.  Past Medical History:  Diagnosis Date  . Acute on chronic systolic and diastolic heart failure, NYHA class 1 ([facility]CC[place]1/10/2018  . Anxiety   . CAD (coronary artery disease)    a. s/p CABG x5 in 2000 b. cath in 02/2017 showing 2/5 patent grafts with patent LIMA-LAD, patent SVG-distal RCA, occluded SVG-D1 and occluded seq-SVG-OM1-OM2 with medical management recommended and possible PCI of distal RCA branches in the future if recurrent anginal symptoms  . CHF (congestive heart failure) ([facility]  a.  EF 20-25% by echo in 12/2016 b. EF 45-50% by echo in 05/2017 c. EF 30% by echo in 11/2018  . Chronic bronchitis ([facility] . CKD (chronic kidney disease) stage 3, GFR 30-59 ml/min 11/21/2018  . COPD (chronic obstructive pulmonary disease) (HCCT[place] . Dizziness   . Esophageal reflux   . Essential hypertension   . Fatigue   . HLD (hyperlipidemia) 11/21/2018  . HTN (hypertension) 11/21/2018  . Hypercholesteremia   . Hyperkalemia   . Ovarian failure   . SOB (shortness of breath)    Social History   Socioeconomic History  . Marital status: Married    Spouse name: Not on file  . Number of children: Not on file  . Years of education: Not on file  . Highest education level: Not on file  Occupational History  . Not on file  Tobacco Use  . Smoking status: Current Every Day Smoker    Packs/day: 1.00    Years: 50.00    Pack years: 50.00    Types: Cigarettes    Last attempt to quit: 02/22/2017    Years since quitting: 2.2  . Smokeless tobacco: Never Used  . Tobacco comment: 1/2 pack per day 05/14/19  Substance and Sexual Activity  .  Alcohol use: No  . Drug use: No  . Sexual activity: Never  Other Topics Concern  . Not on file  Social History Narrative  . Not on file   Social Determinants of Health   Financial Resource Strain:   . Difficulty of Paying Living Expenses:   Food Insecurity:   . Worried About Charity fundraiser in the Last Year:   . Arboriculturist in the Last Year:   Transportation Needs:   . Film/video editor (Medical):   Marland Kitchen Lack of Transportation (Non-Medical):   Physical Activity:   . Days of Exercise per Week:   . Minutes of Exercise per Session:   Stress:   . Feeling of Stress :   Social Connections:   . Frequency of Communication with Friends and Family:   . Frequency of Social Gatherings with Friends and Family:   . Attends Religious Services:   . Active Member of Clubs or Organizations:   . Attends Archivist Meetings:   Marland Kitchen Marital Status:     Family History  Problem Relation Age of Onset  . Heart attack Mother   . Hypertension Father    Scheduled Meds: . methocarbamol  500 mg Oral BID  . nicotine  14 mg Transdermal Daily  . sodium chloride flush  3 mL Intravenous Q12H   Continuous Infusions: . sodium chloride 250 mL (05/21/19 0706)   PRN Meds:.sodium chloride, acetaminophen, acetaminophen, albuterol, ALPRAZolam, glycopyrrolate, metoprolol tartrate, morphine injection, nitroGLYCERIN, sodium chloride flush Medications Prior to Admission:  Prior to Admission medications   Medication Sig Start Date End Date Taking? Authorizing Provider  acetaminophen (TYLENOL) 325 MG tablet Take 2 tablets (650 mg total) by mouth every 6 (six) hours as needed for mild pain, fever or headache. 05/04/19  Yes Emokpae, Courage, MD  albuterol (VENTOLIN HFA) 108 (90 Base) MCG/ACT inhaler Inhale 1-2 puffs into the lungs every 6 (six) hours as needed for wheezing or shortness of breath. 05/04/19  Yes Emokpae, Courage, MD  ALPRAZolam Duanne Moron) 0.25 MG tablet Take 1 tablet (0.25 mg total) by mouth at bedtime as needed for anxiety. 05/04/19 05/03/20 Yes Roxan Hockey, MD  aspirin EC 81 MG tablet Take 1 tablet (81 mg total) by mouth daily with breakfast. 05/04/19  Yes Emokpae, Courage, MD  carvedilol (COREG) 6.25 MG tablet Take 1 tablet (6.25 mg total) by mouth 2 (two) times daily. 05/14/19  Yes Verta Ellen., NP  clopidogrel (PLAVIX) 75 MG tablet Take 1 tablet (75 mg total) by mouth daily. 05/04/19  Yes Roxan Hockey, MD  ferrous sulfate 325 (65 FE) MG tablet Take 1 tablet (325 mg total) by mouth 2 (two) times daily with a meal. 01/24/19  Yes Lorella Nimrod, MD  furosemide (LASIX) 40 MG tablet Take 1 tablet (40 mg total) by mouth daily. (may take extra as needed for weight gain of 3 pounds in 1 day or 5 pounds in 1 week) 12/11/18  Yes Herminio Commons, MD  hydrALAZINE (APRESOLINE) 25 MG tablet Take 1 tablet (25 mg total) by mouth 3 (three) times daily.  05/04/19  Yes Emokpae, Courage, MD  isosorbide dinitrate (ISORDIL) 5 MG tablet Take 5 mg by mouth 2 (two) times daily.   Yes [provider]  Multiple Vitamin (MULTIVITAMIN) tablet Take 1 tablet by mouth daily.   Yes [provider]  nitroGLYCERIN (NITROSTAT) 0.4 MG SL tablet Place 1 tablet (0.4 mg total) under the tongue every 5 (five) minutes x 3 doses as  needed for chest pain. 02/18/17  Yes Kilroy, Luke K, PA-C  potassium chloride SA (KLOR-CON M20) 20 MEQ tablet Take 1 tablet (20 mEq total) by mouth daily. (may take one extra tab when taking extra Lasix) 05/04/19  Yes Emokpae, Courage, MD  rosuvastatin (CRESTOR) 5 MG tablet Take 5 mg by mouth daily. 12/22/18  Yes [provider]  vitamin B-12 (CYANOCOBALAMIN) 250 MCG tablet Take 250 mcg by mouth daily.   Yes [provider]   No Known Allergies Review of Systems  Unable to perform ROS: Acuity of condition   Physical Exam Vitals and nursing note reviewed.  Constitutional:      Appearance: She is cachectic. She is ill-appearing.  HENT:     Head: Normocephalic and atraumatic.  Cardiovascular:     Rate and Rhythm: Rhythm irregularly irregular.     Comments: afib RVR Pulmonary:     Effort: No tachypnea, accessory muscle usage or respiratory distress.     Breath sounds: Normal breath sounds.  Abdominal:     Tenderness: There is no abdominal tenderness.  Skin:    General: Skin is warm and dry.     Coloration: Skin is pale.  Neurological:     Mental Status: She is alert, oriented to person, place, and time and easily aroused.    Vital Signs: BP (!) 79/64   Pulse (!) 137   Temp 97.6 F (36.4 C) (Axillary)   Resp (!) 35   Ht 5' (1.524 m)   Wt 40.2 kg   SpO2 100%   BMI 17.31 kg/m  Pain Scale: 0-10   Pain Score: 0-No pain   SpO2: SpO2: 100 % O2 Device:SpO2: 100 % O2 Flow Rate: .O2 Flow Rate (L/min): 2 L/min  IO: Intake/output summary:   Intake/Output Summary (Last 24 hours) at 05/21/2019  1205 Last data filed at 05/21/2019 1000 Gross per 24 hour  Intake 369.71 ml  Output 600 ml  Net -230.29 ml    LBM: Last BM Date: 05/19/19 Baseline Weight: Weight: 40.2 kg Most recent weight: Weight: (patient confused, sitting up in chair. Did not get weight.)     Palliative Assessment/Data: PPS 30%   Flowsheet Rows     Most Recent Value  Intake Tab  Referral Department  Hospitalist  Unit at Time of Referral  ICU  Palliative Care Primary Diagnosis  Cardiac  Palliative Care Type  New Palliative care  Reason for referral  Clarify Goals of Care  Date first seen by Palliative Care  05/21/19  Clinical Assessment  Palliative Performance Scale Score  30%  Psychosocial & Spiritual Assessment  Palliative Care Outcomes  Patient/Family meeting held?  Yes  Who was at the meeting?  patient, husband, daughter  Palliative Care Outcomes  Clarified goals of care, Counseled regarding hospice, Improved pain interventions, Improved non-pain symptom therapy, Provided end of life care assistance, Provided psychosocial or spiritual support, Changed to focus on comfort, ACP counseling assistance      Time In/Out: 1100-1200 Time Total: 60 Greater than 50%  of this time was spent counseling and coordinating care related to the above assessment and plan.  Signed by:  Ihor Dow, DNP, FNP-C Palliative Medicine Team  Phone: [phone] Fax: [phone]   Please contact Palliative Medicine Team phone at [phone] for questions and concerns.  For individual provider: See Shea Evans

## 2019-05-21 NOTE — Care Management Important Message (Signed)
Important Message  Patient Details  Name: Susan [name] MRN: [id number] Date of Birth: Mar 20, 1945   Medicare Important Message Given:  Yes     Corey Harold 05/21/2019, 4:31 PM

## 2019-05-21 NOTE — Progress Notes (Signed)
Patient has had a second bloody stool with clots.

## 2019-05-21 NOTE — Progress Notes (Addendum)
   Episode of chest pain, A. fib/a flutter and elevated troponins overnight -Started on IV heparin by nocturnist -Developed bloody stools -Heparin stopped -Hemoglobin down to 10.6  from 13.9 -Started on iv Protonix drip -Hemodynamic concerns persist -Stop Coreg, okay to use IV metoprolol for rate control -Stop Plavix due to GI bleed -We will transfer to stepdown unit -Husband notified---husband is on his way to the hospital -Unable to reach patient's daughter as her voicemail is full   --Overall prognosis is poor   H/o CAD---[facility] 11/20/2018 Cardiac cath Conclusions: 1. Overall stable appearance of the coronary arteries with severe three-vessel coronary artery disease, including chronic total occlusions of the mid LAD, mid LCx, and proximal RCA. 2. Widely patent LIMA-LAD. 3. Patent SVG-distal RCA with up to 50% stenosis in the mid graft. Bifurcation disease involving the distal RCA is similar to prior catheterization, with 90% lesions involving the ostial/proximal rPDA and ostial/proximal RCA continuation. 4. Normal left ventricular filling pressure.  Recommendations: 1. Medical optimization, including escalation of evidence-based heart failure therapy, improved blood pressure control, and smoking cessation. 2. Dual antiplatelet therapy with aspirin and clopidogrel for at least 12 months, if tolerated. If symptoms recur, complex PCI to the distal RCA bifurcation will need to be considered. As previously noted, this may requiring "kissing stents" at the ostia of the rPDA and RCA continuation. This would be a potentially high risk procedure, given the territory supplied (via native vessels and collaterals).  Shon Hale, MD

## 2019-05-21 NOTE — Progress Notes (Signed)
Patient HR remains in the 140's. No complaints at this time. Dr Park Breed notified. New order for one time dose of 5mg  IV Lopressor.

## 2019-05-21 NOTE — Progress Notes (Signed)
Patient reports relief of chest pain after X2 doses of sublingual nitroglycerin. Bladder scan reveals 312 ml. Patient urinates of amber colored urine. Remains in A Flutter.

## 2019-05-21 NOTE — Progress Notes (Signed)
Lab called with critical troponin of 2766. Dr Welton Flakes made aware. New order for heparin drip.

## 2019-05-21 NOTE — Progress Notes (Signed)
Patient converted to Susan [name], HR 112. Patient complains of chest tightness that feels as if "someone is sitting on my chest." Dr Park Breed notified. New orders for stat ECG, sublingual nitroglycerin, and troponin's. Patient placed on nasal cannula at 2L. O2 sats 98%

## 2019-05-22 DIAGNOSIS — I5020 Unspecified systolic (congestive) heart failure: Secondary | ICD-10-CM

## 2019-05-22 MED ORDER — FENTANYL CITRATE (PF) 100 MCG/2ML IJ SOLN
25.0000 ug | Freq: Once | INTRAMUSCULAR | Status: AC
  Administered 2019-05-22: 25 ug via INTRAVENOUS
  Filled 2019-05-22: qty 2

## 2019-05-22 MED ORDER — MORPHINE SULFATE (CONCENTRATE) 10 MG/0.5ML PO SOLN: 5.0000 mg | ORAL | Status: DC | PRN

## 2019-05-22 MED ORDER — KETOROLAC TROMETHAMINE 15 MG/ML IJ SOLN
15.0000 mg | Freq: Once | INTRAMUSCULAR | Status: AC
  Administered 2019-05-23: 15 mg via INTRAVENOUS
  Filled 2019-05-22: qty 1

## 2019-05-22 NOTE — Progress Notes (Signed)
Visited with patient, her husband and daughter Susan [name]. Patient expressed concern spiritual support and prayer. Listened supportively to patient and  Family as they begin life's review. Helped patient and family maintained hope. Patient and family thanked me for my visit and said "it really helps to talk."

## 2019-05-22 NOTE — Progress Notes (Signed)
Daily Progress Note   Patient Name: Susan [name]       Date: 05/22/2019 DOB: September 26, 1945  Age: 74 y.o. MRN#: [id number] Attending Physician: Roxan Hockey, MD Primary Care Physician: [name], MD Admit Date: 05/18/2019  Reason for Consultation/Follow-up: Establishing goals of care and Terminal Care  Subjective: Patient awake, alert, communicating with family. Denies pain or dyspnea and appears comfortable during visit. Had bites of breakfast. Received prn morphine and xanax on night shift.  GOC:  F/u with husband and daughter regarding plan of care. Discussed hospice options and philosophy. Answered many questions about hospice options. Husband and daughter feel it would be best for discharge to hospice facility, with fear that her symptoms may not be managed well at home. Patient agrees with residential hospice placement and is glad her family can visit her there. Discussed symptom management medications. Spiritual/emotional support provided.   Length of Stay: 4  Current Medications: Scheduled Meds:  . mouth rinse  15 mL Mouth Rinse BID  . methocarbamol  500 mg Oral BID  . nicotine  14 mg Transdermal Daily  . sodium chloride flush  3 mL Intravenous Q12H    Continuous Infusions: . sodium chloride Stopped (05/21/19 0752)    PRN Meds: sodium chloride, acetaminophen, acetaminophen, albuterol, ALPRAZolam, glycopyrrolate, metoprolol tartrate, morphine injection, nitroGLYCERIN, sodium chloride flush  Physical Exam Vitals and nursing note reviewed.  Constitutional:      General: She is awake.     Appearance: She is cachectic. She is ill-appearing.  HENT:     Head: Normocephalic and atraumatic.  Cardiovascular:     Rate and Rhythm: Regular rhythm.     Heart sounds: Normal heart  sounds.  Pulmonary:     Effort: No tachypnea, accessory muscle usage or respiratory distress.     Breath sounds: Rhonchi present.  Skin:    General: Skin is warm and dry.     Coloration: Skin is pale.  Neurological:     Mental Status: She is alert and oriented to person, place, and time.            Vital Signs: BP 97/67   Pulse 92   Temp 98 F (36.7 C) (Axillary)   Resp (!) 27   Ht 5' (1.524 m)   Wt 40.2 kg   SpO2 95%   BMI 17.31  kg/m  SpO2: SpO2: 95 % O2 Device: O2 Device: Nasal Cannula O2 Flow Rate: O2 Flow Rate (L/min): 2 L/min  Intake/output summary:   Intake/Output Summary (Last 24 hours) at 05/22/2019 1212 Last data filed at 05/22/2019 0934 Gross per 24 hour  Intake 569.64 ml  Output -  Net 569.64 ml   LBM: Last BM Date: 05/21/19 Baseline Weight: Weight: 40.2 kg Most recent weight: Weight: (patient confused, sitting up in chair. Did not get weight.)       Palliative Assessment/Data: PPS 30%    Flowsheet Rows     Most Recent Value  Intake Tab  Referral Department  Hospitalist  Unit at Time of Referral  ICU  Palliative Care Primary Diagnosis  Cardiac  Palliative Care Type  New Palliative care  Reason for referral  Clarify Goals of Care  Date first seen by Palliative Care  05/21/19  Clinical Assessment  Palliative Performance Scale Score  30%  Psychosocial & Spiritual Assessment  Palliative Care Outcomes  Patient/Family meeting held?  Yes  Who was at the meeting?  patient, husband, daughter  Palliative Care Outcomes  Clarified goals of care, Counseled regarding hospice, Improved pain interventions, Improved non-pain symptom therapy, Provided end of life care assistance, Provided psychosocial or spiritual support, Changed to focus on comfort, ACP counseling assistance      Patient Active Problem List   Diagnosis Date Noted  . NSTEMI (non-ST elevated myocardial infarction) (HCC) 05/21/2019  . Dementia without behavioral disturbance (HCC) 05/20/2019  .  Palliative care encounter 05/20/2019  . Goals of care, counseling/discussion 05/20/2019  . Anxiety attack/Panic Attack/Insomnia 05/20/2019  . Acute on chronic heart failure (HCC) 05/18/2019  . Failure to thrive in adult 05/18/2019  . Hyponatremia 05/18/2019  . Acute exacerbation of CHF (congestive heart failure) (HCC) 05/03/2019  . CHF exacerbation (HCC) 05/02/2019  . Acute gastritis with hemorrhage   . Acute gastric ulcer with hemorrhage   . Acute upper GI bleed 01/19/2019  . Symptomatic anemia   . ST segment depression   . Elevated troponin   . Fracture, Col[place], left, closed   . Fracture   . GI bleed 01/18/2019  . Acute on chronic systolic and diastolic heart failure, NYHA class 1 (HCC) 11/21/2018  . CKD (chronic kidney disease) stage 3, GFR 30-59 ml/min 11/21/2018  . CAD in native artery 11/21/2018  . HTN (hypertension) 11/21/2018  . HLD (hyperlipidemia) 11/21/2018  . SEMI (subendocardial myocardial infarction) (HCC) 11/19/2018  . Hx of CABG 02/18/2017  . Diastolic dysfunction 02/18/2017  . Pulmonary hypertension (HCC) 02/18/2017  . Smoker 02/18/2017  . Renal insufficiency 02/18/2017  . Dyslipidemia 02/18/2017  . Ischemic cardiomyopathy     Palliative Care Assessment & Plan   Patient Profile: 74 y.o. female  with past medical history of CAD, ICM EF 25-30%, CABG x5 in 2000, GI bleeding, HTN, HLD, COPD, anxiety admitted on 05/18/2019 with shortness of breath and worsening LE edema concerning for CHF exacerbation and RLL pneumonia. BNP over 4500. On 5/10 AM, patient with chest pain, afib/flutter and elevated troponin. Started on heparin gtt and developed bloody stools and drop in hemoglobin from 13.9 to 10.9. Protonix ordered but not started immediately due to loss of IV access. Transferred to ICU stepdown. Cardiology following. ST depression on EKG and elevated troponin consistent with acute coronary syndrome. Cardiac cath 11/20/18 with severe three vessel CAD and recommendation for  medical management. Dr. Purvis Sheffield spoke with husband and decision made for DNR/DNI code status. Recommending palliative medicine consultation for goals  of care.   Assessment: Chronic systolic CHF with EF 25-30% CAD s/p CABG Chest pain Acute coronary syndrome with ST segment changes Acute NSTEMI Afib RVR GI bleeding Anxiety Dyspnea Adult failure to thrive Hx of COPD/current smoker  Recommendations/Plan:  Transitioned to comfort measures only on 05/21/19.  DNR/DNI  Continue prn comfort meds on MAR.  Comfort feeds per patient/family request.  Unrestricted visitor access.  Spiritual care consult  TOC consulted for residential hospice placement if patient remains stable. Family requesting [facility] hosp[facility] facility.   Goals of Care and Additional Recommendations:  Limitations on Scope of Treatment: Full Comfort Care  Code Status: DNR/DNI   Code Status Orders  (From admission, onward)         Start     Ordered   05/21/19 0925  Do not attempt resuscitation (DNR)  Continuous    Question Answer Comment  In the event of cardiac or respiratory ARREST Do not call a "code blue"   In the event of cardiac or respiratory ARREST Do not perform Intubation, CPR, defibrillation or ACLS   In the event of cardiac or respiratory ARREST Use medication by any route, position, wound care, and other measures to relive pain and suffering. May use oxygen, suction and manual treatment of airway obstruction as needed for comfort.   Comments Dr. Purvis Sheffield spoke with patient's husband who agrees      05/21/19 0925        Code Status History    Date Active Date Inactive Code Status Order ID Comments User Context   05/18/2019 1952 05/21/2019 0925 Full Code [id number]  Levie Heritage, DO Inpatient   05/03/2019 0002 05/04/2019 2141 Full Code [id number]  Olga Coaster, MD Inpatient   01/19/2019 0238 01/24/2019 1931 Full Code [id number]  Meredeth Ide, MD ED   11/19/2018 1140 11/21/2018 2148 Full  Code [id number]  Ellsworth Lennox, PA-C Inpatient   02/16/2017 1416 02/18/2017 1553 Full Code [id number]  Arty Baumgartner, NP Inpatient   Advance Care Planning Activity       Prognosis:  Likely weeks if not less with acute on chronic systolic CHF, nonischemic cardiomyopathy, acute coronary syndrome, GI bleeding, adult failure to thrive. Also underlying COPD/ongoing tobacco abuse.  Discharge Planning:  Hospice facility   Care plan was discussed with Dr. Mariea Clonts, RN, patient, husband, daughter  Thank you for allowing the Palliative Medicine Team to assist in the care of this patient.   Time In: 1145- Time Out: 1210 Total Time 25 Prolonged Time Billed   no      Greater than 50%  of this time was spent counseling and coordinating care related to the above assessment and plan.  Vennie Homans, DNP, FNP-C Palliative Medicine Team  Phone: [phone] Fax: [phone]  Please contact Palliative Medicine Team phone at [phone] for questions and concerns.

## 2019-05-22 NOTE — Clinical Social Work Note (Signed)
Per consult request, patient referred to [facility] for residential hospice.    Susan [name], Juleen China, LCSW

## 2019-05-22 NOTE — Progress Notes (Signed)
Pt c/o numbness and pain below knee on L leg, Morphine given with no  Success. Dr. Robb Matar paged and made aware. Spoke with patient and daughter who stated patient wanted to know what what going on with leg. Reeducated on comfort measure status, and patient states she still wants to know. Still complaining of pain after Morphine. Dr. Robb Matar paged again to please come speak with patient and family.

## 2019-05-22 NOTE — Progress Notes (Signed)
Patient Demographics:    Susan [name], is a 74 y.o. female, DOB - 09/16/1945, BPZ:[id number]  Admit date - 05/18/2019   Admitting Physician Kahleb Mcclane Denton Brick, MD  Outpatient Primary MD for the patient is [name], MD  LOS - 4   Chief Complaint  Patient presents with  . Shortness of Breath        Subjective:    Susan [name] today appears comfortable Husband and daughter at bedside  -Awaiting transfer to residential hospice    Assessment  & Plan :    Principal Problem:   NSTEMI (non-ST elevated myocardial infarction) [facility]) Active Problems:   Palliative care encounter   Anxiety attack/Panic Attack/Insomnia   Terminal care   Ischemic cardiomyopathy   Hx of CABG   Pulmonary hypertension (HCC)   Acute on chronic systolic and diastolic heart failure, NYHA class 1 (HCC)   HTN (hypertension)   Failure to thrive in adult   Hyponatremia   Dementia without behavioral disturbance (HCC)   Systolic congestive heart failure ([place])  Brief Summary:- 74 y.o.femalewith medical history significant ofCAD (s/p CABG x5 in 2000 with cath in11/2020showing 2/5 patent grafts with patent LIMA-LAD, patent SVG-distal RCA, occluded SVG-D1 and occluded seq-SVG-OM1-OM2 with medical management recommended, repeat cath in 11/2018 showing stable anatomy with patent LIMA-LAD and patent SVG-dRCA with 50% mid-graft stenosis and medical therapy recommended), chronic combined systolic and diastolic (EF 24-23% by echo in 12/2016, at 45-50% by echo in 05/2017,EF 25 to30% by repeat echo in 01/19/19), HTN, HLD, COPD / emphysema, tobacco useadmitted on 05/18/19 with worsening dyspnea and worsening LE edema and concerns about CHF exacer and Rt LL PNA--- ---  BNP over 4500 and clinical findings consistent with CHF exacerbation -Developed chest pain on 05/20/2019 with EKG ST segment changes/depression and troponin elevation above  7,500, was started on IV heparin on 05/20/2019 but developed GI bleed -Family requested comfort care at this time -Awaiting transfer to residential hospice with comfort care when bed available  A/p 1)HFrEF/chronic systolic dysfunction CHF nonischemic or myopathy ---last known EF 25 to 30% based on echo from January 2021---patient failed oral diuretics at home PTA -BNP > 4,500 (was only 496 on 01/18/19) -  -Developed chest pain on 05/20/2019 with EKG ST segment changes/depression and troponin elevation above 7,500, was started on IV heparin on 05/20/2019 but developed GI bleed -Family requested comfort care at this time   2)COPD/emphysema/ongoing tobacco abuse---continue as needed bronchodilators =- Nicotine patch   3)CAD--status post prior CABG and prior angioplasty with stenting--- --Developed chest pain on 05/20/2019 with EKG ST segment changes/depression and troponin elevation above 7,500, was started on IV heparin on 05/20/2019 but developed GI bleed -Family requested comfort care at this time  4)History of iron Deficiency Anemia--- had GI bleed, hemoglobin dropped to 8.4   5)Chronic Hyponatremia----comfort care  6)FTT in an Adult--- moderate PCM, serum albumin is 3.1--- Ensure twice daily advised along with Magic cup  7)???Rt LL Infiltrate--- -comfort care measures  8)Anxiety DO/Panic Attacks/Insomnia--- xanax 0.25 mg q 8 hrs prn  9)Social/Ethics--- given age, low EF and overall comorbid conditions--- overall prognosis is poor --Palliative care consult requested to help further delineate goals of care --Developed chest pain on 05/20/2019 with EKG ST segment changes/depression and troponin elevation above  7,500, was started on IV heparin on 05/20/2019 but developed GI bleed -Family requested comfort care at this time  -Cardiology, palliative care and GI consults appreciated  10)Dementia--patient with mild to moderate cognitive and memory deficits  Disposition/Need for in-Hospital  Stay- patient unable to be discharged at this time due to --Awaiting transfer to residential hospice with comfort care when bed available -Patient From: home D/C Place: Hospice facility versus  hospice at home  barriers: -Awaiting transfer to residential hospice with comfort care when bed available   Code Status : DNR  Family Communication:  (patient is alert, awake and coherent) -Discussed with husband and daughter at bedside  Consults  : Cardiology/GI/palliative care  DVT Prophylaxis  : Comfort care  Lab Results  Component Value Date   PLT 258 05/21/2019   Inpatient Medications  Scheduled Meds: . mouth rinse  15 mL Mouth Rinse BID  . methocarbamol  500 mg Oral BID  . nicotine  14 mg Transdermal Daily  . sodium chloride flush  3 mL Intravenous Q12H   Continuous Infusions: . sodium chloride Stopped (05/21/19 0752)   PRN Meds:.sodium chloride, acetaminophen, acetaminophen, albuterol, ALPRAZolam, glycopyrrolate, metoprolol tartrate, morphine injection, morphine CONCENTRATE, nitroGLYCERIN, sodium chloride flush   Anti-infectives (From admission, onward)   None       Objective:   Vitals:   05/21/19 2000 05/22/19 0400 05/22/19 0800 05/22/19 0924  BP: 102/62   97/67  Pulse: 77   92  Resp: (!) 27     Temp: (!) 97.5 F (36.4 C) 98 F (36.7 C)    TempSrc: Axillary Axillary    SpO2: 100%  96% 95%  Weight:      Height:        Wt Readings from Last 3 Encounters:  05/18/19 40.2 kg  05/14/19 40.5 kg  05/03/19 40.6 kg    Intake/Output Summary (Last 24 hours) at 05/22/2019 1808 Last data filed at 05/22/2019 0934 Gross per 24 hour  Intake 486 ml  Output -  Net 486 ml   Physical Exam  Gen:- Awake Alert, dyspnea on exertion HEENT:- [place].AT, No sclera icterus Neck-Supple Neck,No JVD,.  Lungs-diminished in bases with bibasilar rales CV- S1, S2 normal, irregularly irregular,, prior sternotomy scar Abd-  +ve B.Sounds, Abd Soft, No tenderness,    Extremity/Skin:-2+  pitting edema, pedal pulses present  Psych-affect is anxious,, baseline memory and cognitive deficits  neuro-generalized weakness, no new focal deficits, no tremors   Data Review:   Micro Results Recent Results (from the past 240 hour(s))  Respiratory Panel by RT PCR (Flu A&B, Covid) - Nasopharyngeal Swab     Status: None   Collection Time: 05/18/19  6:48 PM   Specimen: Nasopharyngeal Swab  Result Value Ref Range Status   SARS Coronavirus 2 by RT PCR NEGATIVE NEGATIVE Final    Comment: (NOTE) SARS-CoV-2 target nucleic acids are NOT DETECTED. The SARS-CoV-2 RNA is generally detectable in upper respiratoy specimens during the acute phase of infection. The lowest concentration of SARS-CoV-2 viral copies this assay can detect is 131 copies/mL. A negative result does not preclude SARS-Cov-2 infection and should not be used as the sole basis for treatment or other patient management decisions. A negative result may occur with  improper specimen collection/handling, submission of specimen other than nasopharyngeal swab, presence of viral mutation(s) within the areas targeted by this assay, and inadequate number of viral copies (<131 copies/mL). A negative result must be combined with clinical observations, patient history, and epidemiological information. The expected result  is Negative. Fact Sheet for Patients:  https://www.moore.com/ Fact Sheet for Healthcare Providers:  https://www.young.biz/ This test is not yet ap proved or cleared by the [place] FDA and  has been authorized for detection and/or diagnosis of SARS-CoV-2 by FDA under an Emergency Use Authorization (EUA). This EUA will remain  in effect (meaning this test can be used) for the duration of the COVID-19 declaration under Section 564(b)(1) of the Act, 21 U.S.C. section 360bbb-3(b)(1), unless the authorization is terminated or revoked sooner.    Influenza A by PCR NEGATIVE  NEGATIVE Final   Influenza B by PCR NEGATIVE NEGATIVE Final    Comment: (NOTE) The Xpert Xpress SARS-CoV-2/FLU/RSV assay is intended as an aid in  the diagnosis of influenza from Nasopharyngeal swab specimens and  should not be used as a sole basis for treatment. Nasal washings and  aspirates are unacceptable for Xpert Xpress SARS-CoV-2/FLU/RSV  testing. Fact Sheet for Patients: https://www.moore.com/ Fact Sheet for Healthcare Providers: https://www.young.biz/ This test is not yet approved or cleared by the [place] FDA and  has been authorized for detection and/or diagnosis of SARS-CoV-2 by  FDA under an Emergency Use Authorization (EUA). This EUA will remain  in effect (meaning this test can be used) for the duration of the  Covid-19 declaration under Section 564(b)(1) of the Act, 21  U.S.C. section 360bbb-3(b)(1), unless the authorization is  terminated or revoked. Performed at [facility], [address], [place], [place] [address]   MRSA PCR Screening     Status: None   Collection Time: 05/21/19  6:42 PM   Specimen: Nasal Mucosa; Nasopharyngeal  Result Value Ref Range Status   MRSA by PCR NEGATIVE NEGATIVE Final    Comment:        The GeneXpert MRSA Assay (FDA approved for NASAL specimens only), is one component of a comprehensive MRSA colonization surveillance program. It is not intended to diagnose MRSA infection nor to guide or monitor treatment for MRSA infections. Performed at [facility], [address], [place], [place] [address]    Radiology Reports DG Chest Tow 1 View  Result Date: 05/18/2019 CLINICAL DATA:  Shortness of breath. EXAM: PORTABLE CHEST 1 VIEW COMPARISON:  05/02/2018 FINDINGS: Prior median sternotomy and scratched it prior CABG. Stable severe cardiomegaly. Prominent right lower lobe atelectasis/infiltrate. Small right pleural effusion. No pneumothorax. Degenerative change thoracic spine. IMPRESSION: 1.   Prior CABG.  Stable severe cardiomegaly. 2. Prominent right lower lobe atelectasis/infiltrate. Small right pleural effusion. Electronically Signed   By: Maisie Fus  Register   On: 05/18/2019 14:11   DG Chest Portable 1 View  Result Date: 05/02/2019 CLINICAL DATA:  Short of breath, heart failure EXAM: PORTABLE CHEST 1 VIEW COMPARISON:  04/21/2019 FINDINGS: Single frontal view of the chest demonstrates persistent enlargement of the cardiac silhouette. Postsurgical changes from prior CABG. Background emphysema unchanged. There is chronic central vascular congestion without airspace disease, effusion, or pneumothorax. No acute bony abnormality. IMPRESSION: 1. Stable enlarged cardiac silhouette. 2. Chronic central vascular congestion, no acute process. 3. Emphysema. Electronically Signed   By: Sharlet Salina M.D.   On: 05/02/2019 19:10    CBC Recent Labs  Lab 05/18/19 1420 05/21/19 0733 05/21/19 1007  WBC 11.0* 12.5*  --   HGB 13.9 10.6* 8.4*  HCT 42.0 33.0* 26.8*  PLT 267 258  --   MCV 102.4* 104.4*  --   MCH 33.9 33.5  --   MCHC 33.1 32.1  --   RDW 13.7 14.0  --   LYMPHSABS  1.0 0.7  --   MONOABS 0.7 1.1*  --   EOSABS 0.1 0.0  --   BASOSABS 0.0 0.0  --     Chemistries  Recent Labs  Lab 05/18/19 1420 05/19/19 0700 05/20/19 0621 05/21/19 0407 05/21/19 0733  NA 124* 128* 127* 129* 128*  K 4.5 5.0 5.9* 4.0 4.1  CL 85* 88* 89* 90* 89*  CO2 27 27 25 27 29   GLUCOSE 126* 131* 126* 124* 116*  BUN 20 23 30* 29* 30*  CREATININE 1.16* 1.13* 1.19* 1.14* 1.14*  CALCIUM 8.6* 8.6* 8.7* 7.9* 8.1*  AST 37  --   --   --  61*  ALT 34  --   --   --  29  ALKPHOS 80  --   --   --  70  BILITOT 1.0  --   --   --  1.1   ------------------------------------------------------------------------------------------------------------------ No results for input(s): CHOL, HDL, LDLCALC, TRIG, CHOLHDL, LDLDIRECT in the last 72 hours.  No results found for: HGBA1C  ------------------------------------------------------------------------------------------------------------------ No results for input(s): TSH, T4TOTAL, T3FREE, THYROIDAB in the last 72 hours.  Invalid input(s): FREET3 ------------------------------------------------------------------------------------------------------------------ No results for input(s): VITAMINB12, FOLATE, FERRITIN, TIBC, IRON, RETICCTPCT in the last 72 hours.  Coagulation profile Recent Labs  Lab 05/21/19 0733  INR 1.1    No results for input(s): DDIMER in the last 72 hours.  Cardiac Enzymes No results for input(s): CKMB, TROPONINI, MYOGLOBIN in the last 168 hours.  Invalid input(s): CK ------------------------------------------------------------------------------------------------------------------    Component Value Date/Time   BNP >4,500.0 (H) 05/18/2019 1421   07/18/2019 M.D on 05/22/2019 at 6:08 PM  Go to [email or web address] - for contact info  [facility] - Office  [phone]

## 2019-05-23 ENCOUNTER — Encounter (HOSPITAL_COMMUNITY): Payer: Self-pay | Admitting: Family Medicine

## 2019-05-23 ENCOUNTER — Inpatient Hospital Stay (HOSPITAL_COMMUNITY)
Admission: RE | Admit: 2019-05-23 | Discharge: 2019-05-25 | DRG: 951 | Disposition: A | Discharge status: Hospice | Source: Hospice | Attending: Family Medicine | Admitting: Family Medicine

## 2019-05-23 ENCOUNTER — Other Ambulatory Visit: Payer: Self-pay

## 2019-05-23 DIAGNOSIS — F411 Generalized anxiety disorder: Secondary | ICD-10-CM | POA: Diagnosis present

## 2019-05-23 DIAGNOSIS — K219 Gastro-esophageal reflux disease without esophagitis: Secondary | ICD-10-CM | POA: Diagnosis present

## 2019-05-23 DIAGNOSIS — F17200 Nicotine dependence, unspecified, uncomplicated: Secondary | ICD-10-CM | POA: Diagnosis present

## 2019-05-23 DIAGNOSIS — E7800 Pure hypercholesterolemia, unspecified: Secondary | ICD-10-CM | POA: Diagnosis present

## 2019-05-23 DIAGNOSIS — I110 Hypertensive heart disease with heart failure: Secondary | ICD-10-CM | POA: Diagnosis present

## 2019-05-23 DIAGNOSIS — I5043 Acute on chronic combined systolic (congestive) and diastolic (congestive) heart failure: Secondary | ICD-10-CM | POA: Diagnosis present

## 2019-05-23 DIAGNOSIS — Z951 Presence of aortocoronary bypass graft: Secondary | ICD-10-CM | POA: Diagnosis not present

## 2019-05-23 DIAGNOSIS — Z681 Body mass index (BMI) 19 or less, adult: Secondary | ICD-10-CM

## 2019-05-23 DIAGNOSIS — G4700 Insomnia, unspecified: Secondary | ICD-10-CM | POA: Diagnosis present

## 2019-05-23 DIAGNOSIS — I214 Non-ST elevation (NSTEMI) myocardial infarction: Secondary | ICD-10-CM | POA: Diagnosis present

## 2019-05-23 DIAGNOSIS — Z66 Do not resuscitate: Secondary | ICD-10-CM | POA: Diagnosis present

## 2019-05-23 DIAGNOSIS — I130 Hypertensive heart and chronic kidney disease with heart failure and stage 1 through stage 4 chronic kidney disease, or unspecified chronic kidney disease: Secondary | ICD-10-CM | POA: Diagnosis present

## 2019-05-23 DIAGNOSIS — I255 Ischemic cardiomyopathy: Secondary | ICD-10-CM | POA: Diagnosis present

## 2019-05-23 DIAGNOSIS — F0390 Unspecified dementia without behavioral disturbance: Secondary | ICD-10-CM | POA: Diagnosis present

## 2019-05-23 DIAGNOSIS — Z8249 Family history of ischemic heart disease and other diseases of the circulatory system: Secondary | ICD-10-CM

## 2019-05-23 DIAGNOSIS — R627 Adult failure to thrive: Secondary | ICD-10-CM | POA: Diagnosis present

## 2019-05-23 DIAGNOSIS — F410 Panic disorder [episodic paroxysmal anxiety] without agoraphobia: Secondary | ICD-10-CM | POA: Diagnosis present

## 2019-05-23 DIAGNOSIS — Z20822 Contact with and (suspected) exposure to covid-19: Secondary | ICD-10-CM | POA: Diagnosis present

## 2019-05-23 DIAGNOSIS — I272 Pulmonary hypertension, unspecified: Secondary | ICD-10-CM | POA: Diagnosis present

## 2019-05-23 DIAGNOSIS — E871 Hypo-osmolality and hyponatremia: Secondary | ICD-10-CM | POA: Diagnosis present

## 2019-05-23 DIAGNOSIS — J439 Emphysema, unspecified: Secondary | ICD-10-CM | POA: Diagnosis present

## 2019-05-23 DIAGNOSIS — N183 Chronic kidney disease, stage 3 unspecified: Secondary | ICD-10-CM | POA: Diagnosis present

## 2019-05-23 DIAGNOSIS — Z515 Encounter for palliative care: Principal | ICD-10-CM

## 2019-05-23 DIAGNOSIS — E785 Hyperlipidemia, unspecified: Secondary | ICD-10-CM | POA: Diagnosis present

## 2019-05-23 DIAGNOSIS — I509 Heart failure, unspecified: Secondary | ICD-10-CM

## 2019-05-23 DIAGNOSIS — I2510 Atherosclerotic heart disease of native coronary artery without angina pectoris: Secondary | ICD-10-CM | POA: Diagnosis present

## 2019-05-23 MED ORDER — GLYCOPYRROLATE 1 MG PO TABS: 1.0000 mg | ORAL_TABLET | ORAL | Status: DC | PRN

## 2019-05-23 MED ORDER — HALOPERIDOL 0.5 MG PO TABS: 0.5000 mg | ORAL_TABLET | ORAL | Status: DC | PRN

## 2019-05-23 MED ORDER — ONDANSETRON HCL 4 MG/2ML IJ SOLN: 4.0000 mg | Freq: Four times a day (QID) | INTRAMUSCULAR | Status: DC | PRN

## 2019-05-23 MED ORDER — BIOTENE DRY MOUTH MT LIQD: 15.0000 mL | OROMUCOSAL | Status: DC | PRN

## 2019-05-23 MED ORDER — ONDANSETRON 4 MG PO TBDP: 4.0000 mg | ORAL_TABLET | Freq: Four times a day (QID) | ORAL | Status: DC | PRN

## 2019-05-23 MED ORDER — BISACODYL 10 MG RE SUPP: 10.0000 mg | Freq: Every day | RECTAL | Status: DC | PRN

## 2019-05-23 MED ORDER — LORAZEPAM 2 MG/ML PO CONC: 1.0000 mg | ORAL | Status: DC | PRN

## 2019-05-23 MED ORDER — POLYVINYL ALCOHOL 1.4 % OP SOLN: 1.0000 [drp] | Freq: Four times a day (QID) | OPHTHALMIC | Status: DC | PRN

## 2019-05-23 MED ORDER — HALOPERIDOL LACTATE 5 MG/ML IJ SOLN: 0.5000 mg | INTRAMUSCULAR | Status: DC | PRN

## 2019-05-23 MED ORDER — ACETAMINOPHEN 650 MG RE SUPP
650.0000 mg | Freq: Four times a day (QID) | RECTAL | Status: DC | PRN
  Administered 2019-05-25: 650 mg via RECTAL
  Filled 2019-05-23 (×2): qty 1

## 2019-05-23 MED ORDER — LORAZEPAM 2 MG/ML IJ SOLN: 1.0000 mg | INTRAMUSCULAR | Status: DC | PRN

## 2019-05-23 MED ORDER — GLYCOPYRROLATE 0.2 MG/ML IJ SOLN: 0.2000 mg | INTRAMUSCULAR | Status: DC | PRN

## 2019-05-23 MED ORDER — LORAZEPAM 1 MG PO TABS
1.0000 mg | ORAL_TABLET | ORAL | Status: DC | PRN
  Administered 2019-05-24: 1 mg via ORAL
  Filled 2019-05-23: qty 1

## 2019-05-23 MED ORDER — DIPHENHYDRAMINE HCL 50 MG/ML IJ SOLN: 12.5000 mg | INTRAMUSCULAR | Status: DC | PRN

## 2019-05-23 MED ORDER — FENTANYL CITRATE (PF) 100 MCG/2ML IJ SOLN
25.0000 ug | INTRAMUSCULAR | Status: DC | PRN
  Administered 2019-05-24 – 2019-05-25 (×5): 25 ug via INTRAVENOUS
  Filled 2019-05-23 (×6): qty 2

## 2019-05-23 MED ORDER — HALOPERIDOL LACTATE 2 MG/ML PO CONC: 0.5000 mg | ORAL | Status: DC | PRN

## 2019-05-23 MED ORDER — SODIUM CHLORIDE 0.9 % IV SOLN
12.5000 mg | Freq: Four times a day (QID) | INTRAVENOUS | Status: DC | PRN
  Filled 2019-05-23: qty 0.5

## 2019-05-23 MED ORDER — ACETAMINOPHEN 325 MG PO TABS: 650.0000 mg | ORAL_TABLET | Freq: Four times a day (QID) | ORAL | Status: DC | PRN

## 2019-05-23 NOTE — TOC Progression Note (Signed)
Spoke with Susan [name] at Hospice this AM to update. At this time, pt is approved for GIP status here at the hospital. Updated MD to change pt's status.

## 2019-05-23 NOTE — H&P (Signed)
Patient Demographics:    Susan [name], is a 74 y.o. female  MRN: [id number]   DOB - 1945-09-10  Admit Date - 05/23/2019  Outpatient Primary MD for the patient is [name], MD   Assessment & Plan:    Principal Problem:   NSTEMI (non-ST elevated myocardial infarction) [facility]) Active Problems:   Acute on chronic systolic and diastolic heart failure, NYHA class 1 (HCC)   Acute exacerbation of CHF (congestive heart failure) (HCC)   Palliative care encounter   Terminal care   Anxiety attack/Panic Attack/Insomnia   Failure to thrive in adult   Ischemic cardiomyopathy   SEMI (subendocardial myocardial infarction) (HCC)   CAD in native artery   Principal Problem:   NSTEMI (non-ST elevated myocardial infarction) (HCC) Active Problems:   Palliative care encounter   Anxiety attack/Panic Attack/Insomnia   Terminal care   Ischemic cardiomyopathy   Hx of CABG   Pulmonary hypertension (HCC)   Acute on chronic systolic and diastolic heart failure, NYHA class 1 (HCC)   HTN (hypertension)   Failure to thrive in adult   Hyponatremia   Dementia without behavioral disturbance (HCC)   Systolic congestive heart failure (HCC)  Brief Summary:- 74 y.o.femalewith medical history significant ofCAD (s/p CABG x5 in 2000 with cath in11/2020showing 2/5 patent grafts with patent LIMA-LAD, patent SVG-distal RCA, occluded SVG-D1 and occluded seq-SVG-OM1-OM2 with medical management recommended, repeat cath in 11/2018 showing stable anatomy with patent LIMA-LAD and patent SVG-dRCA with 50% mid-graft stenosis and medical therapy recommended), chronic combined systolic and diastolic (EF 20-25% by echo in 12/2016, at 45-50% by echo in 05/2017,EF 25 to30% by repeat echo in 01/19/19), HTN, HLD, COPD / emphysema, tobacco useadmitted on  05/18/19 with worsening dyspnea and worsening LE edema and concerns about CHF exacer and Rt LL PNA--- ---  BNP over 4500 and clinical findings consistent with CHF exacerbation -Developed chest pain on 05/20/2019 with EKG ST segment changes/depression and troponin elevation above 7,500, was started on IV heparin on 05/20/2019 but developed GI bleed -Family requested comfort care at this time -Awaiting transfer to residential hospice with comfort care when bed available  A/p 1)HFrEF/chronic systolic dysfunction CHF nonischemic or myopathy ---last known EF 25 to 30% based on echo from January 2021---patient failed oral diuretics at home PTA -BNP > 4,500 (was only 496 on 01/18/19) -  -Developed chest pain on 05/20/2019 with EKG ST segment changes/depression and troponin elevation above 7,500, was started on IV heparin on 05/20/2019 but developed GI bleed -Family requested comfort care at this time   2)COPD/emphysema/ongoing tobacco abuse---continue as needed bronchodilators =- Nicotine patch   3)CAD--status post prior CABG and prior angioplasty with stenting--- --Developed chest pain on 05/20/2019 with EKG ST segment changes/depression and troponin elevation above 7,500, was started on IV heparin on 05/20/2019 but developed GI bleed -Family requested comfort care at this time  4)History of iron Deficiency Anemia--- had GI bleed, hemoglobin dropped to 8.4   5)Chronic Hyponatremia----comfort  care  6)FTT in an Adult--- moderate PCM, serum albumin is 3.1--- Ensure twice daily advised along with Magic cup  7)???Rt LL Infiltrate--- -comfort care measures  8)Anxiety DO/Panic Attacks/Insomnia--- xanax 0.25 mg q 8 hrs prn  9)Social/Ethics--- given age, low EF and overall comorbid conditions--- overall prognosis is poor --Palliative care consult requested to help further delineate goals of care --Developed chest pain on 05/20/2019 with EKG ST segment changes/depression and troponin elevation above  7,500, was started on IV heparin on 05/20/2019 but developed GI bleed -Family requested comfort care at this time  -Cardiology, palliative care and GI consults appreciated  10)Dementia--patient with mild to moderate cognitive and memory deficits  Disposition/Need for in-Hospital Stay- patient unable to be discharged at this time due to --Awaiting transfer to residential hospice with comfort care when bed available -Patient From: home D/C Place: Hospice facility versus  hospice at home  barriers: -Awaiting transfer to residential hospice with comfort care when bed available   Code Status : DNR  Family Communication:  (patient is alert, awake and coherent) -Discussed with husband and daughter at bedside  Consults  : Cardiology/GI/palliative care  DVT Prophylaxis  : Comfort care   With History of - Reviewed by me  Past Medical History:  Diagnosis Date  . Acute on chronic systolic and diastolic heart failure, NYHA class 1 (HCC) 11/21/2018  . Anxiety   . CAD (coronary artery disease)    a. s/p CABG x5 in 2000 b. cath in 02/2017 showing 2/5 patent grafts with patent LIMA-LAD, patent SVG-distal RCA, occluded SVG-D1 and occluded seq-SVG-OM1-OM2 with medical management recommended and possible PCI of distal RCA branches in the future if recurrent anginal symptoms  . CHF (congestive heart failure) (HCC)    a. EF 20-25% by echo in 12/2016 b. EF 45-50% by echo in 05/2017 c. EF 30% by echo in 11/2018  . Chronic bronchitis (HCC)   . CKD (chronic kidney disease) stage 3, GFR 30-59 ml/min 11/21/2018  . COPD (chronic obstructive pulmonary disease) (HCC)   . Dizziness   . Esophageal reflux   . Essential hypertension   . Fatigue   . HLD (hyperlipidemia) 11/21/2018  . HTN (hypertension) 11/21/2018  . Hypercholesteremia   . Hyperkalemia   . Ovarian failure   . SOB (shortness of breath)       Past Surgical History:  Procedure Laterality Date  . ABDOMINAL HYSTERECTOMY    .  APPENDECTOMY    . BIOPSY  01/22/2019   Procedure: BIOPSY;  Surgeon: Beverley Fiedler, MD;  Location: [facility] ENDOSCOPY;  Service: Gastroenterology;;  . BREAST BIOPSY Right   . CARDIAC CATHETERIZATION  2000  . CORONARY ARTERY BYPASS GRAFT  2000   CABG X5  . ESOPHAGOGASTRODUODENOSCOPY (EGD) WITH PROPOFOL N/A 01/22/2019   Procedure: ESOPHAGOGASTRODUODENOSCOPY (EGD) WITH PROPOFOL;  Surgeon: Beverley Fiedler, MD;  Location: [facility] ENDOSCOPY;  Service: Gastroenterology;  Laterality: N/A;  . LEFT HEART CATH AND CORS/GRAFTS ANGIOGRAPHY N/A 02/17/2017   Procedure: LEFT HEART CATH AND CORS/GRAFTS ANGIOGRAPHY;  Surgeon: Kathleene Hazel, MD;  Location: [facility] INVASIVE CV LAB;  Service: Cardiovascular;  Laterality: N/A;  . LEFT HEART CATH AND CORS/GRAFTS ANGIOGRAPHY N/A 11/20/2018   Procedure: LEFT HEART CATH AND CORS/GRAFTS ANGIOGRAPHY;  Surgeon: Yvonne Kendall, MD;  Location: [facility] INVASIVE CV LAB;  Service: Cardiovascular;  Laterality: N/A;      No chief complaint on file.     HPI:    Aalaysia Liggins  is a 74 y.o. female     Brief  Summary:- 74 y.o.femalewith medical history significant ofCAD (s/p CABG x5 in 2000 with cath in11/2020showing 2/5 patent grafts with patent LIMA-LAD, patent SVG-distal RCA, occluded SVG-D1 and occluded seq-SVG-OM1-OM2 with medical management recommended, repeat cath in 11/2018 showing stable anatomy with patent LIMA-LAD and patent SVG-dRCA with 50% mid-graft stenosis and medical therapy recommended), chronic combined systolic and diastolic (EF 19-50% by echo in 12/2016, at 45-50% by echo in 05/2017,EF 25 to30% by repeat echo in 01/19/19), HTN, HLD, COPD / emphysema, tobacco useadmitted on 05/18/19 with worsening dyspnea and worsening LE edema and concerns about CHF exacer and Rt LL PNA--- ---  BNP over 4500 and clinical findings consistent with CHF exacerbation -Developed chest pain on 05/20/2019 with EKG ST segment changes/depression and troponin elevation above 7,500, was started on  IV heparin on 05/20/2019 but developed GI bleed -Family requested comfort care at this time -Awaiting transfer to residential hospice with comfort care when bed available   Review of systems:    In addition to the HPI above,   A full Review of  Systems was done, all other systems reviewed are negative except as noted above in HPI , .    Social History:  Reviewed by me    Social History   Tobacco Use  . Smoking status: Current Every Day Smoker    Packs/day: 1.00    Years: 50.00    Pack years: 50.00    Types: Cigarettes    Last attempt to quit: 02/22/2017    Years since quitting: 2.2  . Smokeless tobacco: Never Used  . Tobacco comment: 1/2 pack per day 05/14/19  Substance Use Topics  . Alcohol use: No     Family History :  Reviewed by me    Family History  Problem Relation Age of Onset  . Heart attack Mother   . Hypertension Father      Home Medications:   Prior to Admission medications   Not on File     Allergies:    No Known Allergies   Physical Exam:   Vitals  Temp:  [98 F (36.7 C)] 98 F (36.7 C) (05/11 2000) Pulse Rate:  [86-97] 86 (05/12 1500) Resp:  [16] 16 (05/12 1500) BP: (117-131)/(69-77) 124/71 (05/12 1500) SpO2:  [94 %-100 %] 94 % (05/12 1500)   Gen-Awake Alert, dyspnea on exertion HEENT:- Spring.AT, No sclera icterus Neck-Supple Neck,No JVD,.  Lungs-diminished in bases with bibasilar rales CV- S1, S2 normal, irregularly irregular,, prior sternotomy scar Abd-  +ve B.Sounds, Abd Soft, No tenderness,    Extremity/Skin:-2+ pitting edema, pedal pulses present  Psych-affect is anxious,, baseline memory and cognitive deficits  neuro-generalized weakness, no new focal deficits, no tremors     Data Review:    CBC Recent Labs  Lab 05/18/19 1420 05/21/19 0733 05/21/19 1007  WBC 11.0* 12.5*  --   HGB 13.9 10.6* 8.4*  HCT 42.0 33.0* 26.8*  PLT 267 258  --   MCV 102.4* 104.4*  --   MCH 33.9 33.5  --   MCHC 33.1 32.1  --   RDW 13.7 14.0   --   LYMPHSABS 1.0 0.7  --   MONOABS 0.7 1.1*  --   EOSABS 0.1 0.0  --   BASOSABS 0.0 0.0  --    ------------------------------------------------------------------------------------------------------------------  Chemistries  Recent Labs  Lab 05/18/19 1420 05/19/19 0700 05/20/19 0621 05/21/19 0407 05/21/19 0733  NA 124* 128* 127* 129* 128*  K 4.5 5.0 5.9* 4.0 4.1  CL 85* 88* 89* 90* 89*  CO2 27 27 25 27 29   GLUCOSE 126* 131* 126* 124* 116*  BUN 20 23 30* 29* 30*  CREATININE 1.16* 1.13* 1.19* 1.14* 1.14*  CALCIUM 8.6* 8.6* 8.7* 7.9* 8.1*  AST 37  --   --   --  61*  ALT 34  --   --   --  29  ALKPHOS 80  --   --   --  70  BILITOT 1.0  --   --   --  1.1   ------------------------------------------------------------------------------------------------------------------ estimated creatinine clearance is 27.1 mL/min (A) (by C-G formula based on SCr of 1.14 mg/dL (H)). ------------------------------------------------------------------------------------------------------------------ No results for input(s): TSH, T4TOTAL, T3FREE, THYROIDAB in the last 72 hours.  Invalid input(s): FREET3   Coagulation profile Recent Labs  Lab 05/21/19 0733  INR 1.1   ------------------------------------------------------------------------------------------------------------------- No results for input(s): DDIMER in the last 72 hours. -------------------------------------------------------------------------------------------------------------------  Cardiac Enzymes No results for input(s): CKMB, TROPONINI, MYOGLOBIN in the last 168 hours.  Invalid input(s): CK ------------------------------------------------------------------------------------------------------------------    Component Value Date/Time   BNP >4,500.0 (H) 05/18/2019 1421     ---------------------------------------------------------------------------------------------------------------  Urinalysis    Component Value  Date/Time   COLORURINE YELLOW 01/18/2019 2329   APPEARANCEUR CLEAR 01/18/2019 2329   LABSPEC 1.019 01/18/2019 2329   PHURINE 5.0 01/18/2019 2329   GLUCOSEU NEGATIVE 01/18/2019 2329   HGBUR SMALL (A) 01/18/2019 2329   BILIRUBINUR NEGATIVE 01/18/2019 2329   KETONESUR NEGATIVE 01/18/2019 2329   PROTEINUR 100 (A) 01/18/2019 2329   NITRITE NEGATIVE 01/18/2019 2329   LEUKOCYTESUR NEGATIVE 01/18/2019 2329    ----------------------------------------------------------------------------------------------------------------   Imaging Results:    No results found.  Radiological Exams on Admission: No results found.  DVT Prophylaxis -comfort care AM Labs Ordered, also please review Full Orders  Family Communication: Admission, patients condition and plan of care including tests being ordered have been discussed with the patient and husband at bedside who indicate understanding and agree with the plan   Code Status -DNR  Likely DC to residential hospice  Condition   --- comfort care grave prognosis  03/18/2019 M.D on 05/23/2019 at 5:30 PM Go to [email or web address] -  for contact info  [facility] - Office  [phone]

## 2019-05-23 NOTE — Discharge Summary (Signed)
Susan [name], is a 74 y.o. female  DOB 07-06-1945  MRN [id number].  Admission date:  05/18/2019  Admitting Physician  Shon Hale, MD  Discharge Date:  05/23/2019   Primary MD  [name], MD  Recommendations for primary care physician for things to follow:   -Discharge to Hospice house with full comfort measures  Admission Diagnosis  Acute on chronic heart failure (HCC) [I50.9] Systolic congestive heart failure, unspecified HF chronicity (HCC) [I50.20]   Discharge Diagnosis  Acute on chronic heart failure (HCC) [I50.9] Systolic congestive heart failure, unspecified HF chronicity (HCC) [I50.20]    Principal Problem:   NSTEMI (non-ST elevated myocardial infarction) (HCC) Active Problems:   Palliative care encounter   Anxiety attack/Panic Attack/Insomnia   Terminal care   Ischemic cardiomyopathy   Hx of CABG   Pulmonary hypertension (HCC)   Acute on chronic systolic and diastolic heart failure, NYHA class 1 (HCC)   HTN (hypertension)   Failure to thrive in adult   Hyponatremia   Dementia without behavioral disturbance (HCC)   Systolic congestive heart failure (HCC)      Past Medical History:  Diagnosis Date  . Acute on chronic systolic and diastolic heart failure, NYHA class 1 (HCC) 11/21/2018  . Anxiety   . CAD (coronary artery disease)    a. s/p CABG x5 in 2000 b. cath in 02/2017 showing 2/5 patent grafts with patent LIMA-LAD, patent SVG-distal RCA, occluded SVG-D1 and occluded seq-SVG-OM1-OM2 with medical management recommended and possible PCI of distal RCA branches in the future if recurrent anginal symptoms  . CHF (congestive heart failure) (HCC)    a. EF 20-25% by echo in 12/2016 b. EF 45-50% by echo in 05/2017 c. EF 30% by echo in 11/2018  . Chronic bronchitis (HCC)   . CKD (chronic kidney disease) stage 3, GFR 30-59 ml/min 11/21/2018  . COPD (chronic obstructive pulmonary  disease) (HCC)   . Dizziness   . Esophageal reflux   . Essential hypertension   . Fatigue   . HLD (hyperlipidemia) 11/21/2018  . HTN (hypertension) 11/21/2018  . Hypercholesteremia   . Hyperkalemia   . Ovarian failure   . SOB (shortness of breath)     Past Surgical History:  Procedure Laterality Date  . ABDOMINAL HYSTERECTOMY    . APPENDECTOMY    . BIOPSY  01/22/2019   Procedure: BIOPSY;  Surgeon: Beverley Fiedler, MD;  [facility] END[facility];  Service: Gastroenterology;;  . BREAST BIOPSY Right   . CARDIAC CATHETERIZATION  2000  . CORONARY ARTERY BYPASS GRAFT  2000   CABG X5  . ESOPHAGOGASTRODUODENOSCOPY (EGD) WITH PROPOFOL N/A 01/22/2019   Procedure: ESOPHAGOGASTRODUODENOSCOPY (EGD) WITH PROPOFOL;  Surgeon: Beverley Fiedler, MD;  Location: [facility] ENDOSCOPY;  Service: Gastroenterology;  Laterality: N/A;  . LEFT HEART CATH AND CORS/GRAFTS ANGIOGRAPHY N/A 02/17/2017   Procedure: LEFT HEART CATH AND CORS/GRAFTS ANGIOGRAPHY;  Surgeon: Kathleene Hazel, MD;  Location: [facility] INVASIVE CV LAB;  Service: Cardiovascular;  Laterality: N/A;  . LEFT HEART CATH AND CORS/GRAFTS ANGIOGRAPHY N/A 11/20/2018  Procedure: LEFT HEART CATH AND CORS/GRAFTS ANGIOGRAPHY;  Surgeon: Yvonne Kendall, MD;  Location: [facility] INVASIVE CV LAB;  Service: Cardiovascular;  Laterality: N/A;     HPI  from the history and physical done on the day of admission:     HPI: Susan [name] is a 74 y.o. female with a history of chronic systolic and diastolic heart failure with EF of 20-25%, ischemic cardiomyopathy, anxiety, hypertension, hyperlipidemia.  Patient was recently hospitalized last week for shortness of breath.  She was admitted and diuresed some and then discharged from the hospital on 4/23.  She followed up with her cardiologist, who increased her carvedilol and her Lasix.  Over the past few days, she has taken 3 of her 40 mg tablets.  She is remained short of breath with increasing edema in her legs.  No palliating or  provoking factors.    She has been having a cough with clear sputum production  Emergency Department Course: BNP greater than 4500.  Creatinine 1.16, sodium 124.  White count 11.  Chest x-ray shows severe cardiomegaly with right lower lobe lobe atelectasis/infiltrate   Hospital Course:    Brief Summary:- 74 y.o.femalewith medical history significant ofCAD (s/p CABG x5 in 2000 with cath in11/2020showing 2/5 patent grafts with patent LIMA-LAD, patent SVG-distal RCA, occluded SVG-D1 and occluded seq-SVG-OM1-OM2 with medical management recommended, repeat cath in 11/2018 showing stable anatomy with patent LIMA-LAD and patent SVG-dRCA with 50% mid-graft stenosis and medical therapy recommended), chronic combined systolic and diastolic (EF 20-25% by echo in 12/2016, at 45-50% by echo in 05/2017,EF 25 to30% by repeat echo in 01/19/19), HTN, HLD, COPD / emphysema, tobacco useadmitted on 05/18/19 with worsening dyspnea and worsening LE edema and concerns about CHF exacer and Rt LL PNA--- ---  BNP over 4500 and clinical findings consistent with CHF exacerbation -Developed chest pain on 05/20/2019 with EKG ST segment changes/depression and troponin elevation above 7,500, was started on IV heparin on 05/20/2019 but developed GI bleed -Family requested comfort care at this time -Awaiting transfer to residential hospice with comfort care when bed available  A/p 1)HFrEF/chronic systolic dysfunction CHF nonischemic or myopathy ---last known EF 25 to 30% based on echo from January 2021---patient failed oral diuretics at home PTA -BNP > 4,500 (was only 496 on 01/18/19) -  -Developed chest pain on 05/20/2019 with EKG ST segment changes/depression and troponin elevation above 7,500, was started on IV heparin on 05/20/2019 but developed GI bleed -Family requested comfort care at this time   2)COPD/emphysema/ongoing tobacco abuse---continue as needed bronchodilators =- Nicotine patch   3)CAD--status  post prior CABG and prior angioplasty with stenting--- --Developed chest pain on 05/20/2019 with EKG ST segment changes/depression and troponin elevation above 7,500, was started on IV heparin on 05/20/2019 but developed GI bleed -Family requested comfort care at this time  4)History of iron Deficiency Anemia--- had GI bleed, hemoglobin dropped to 8.4   5)Chronic Hyponatremia----comfort care  6)FTT in an Adult--- moderate PCM, serum albumin is 3.1--- Ensure twice daily advised along with Magic cup  7)???Rt LL Infiltrate--- -comfort care measures  8)Anxiety DO/Panic Attacks/Insomnia--- xanax 0.25 mg q 8 hrs prn  9)Social/Ethics--- given age, low EF and overall comorbid conditions--- overall prognosis is poor --Palliative care consult requested to help further delineate goals of care --Developed chest pain on 05/20/2019 with EKG ST segment changes/depression and troponin elevation above 7,500, was started on IV heparin on 05/20/2019 but developed GI bleed -Family requested comfort care at this time  -Cardiology, palliative care and  GI consults appreciated  10)Dementia--patient with mild to moderate cognitive and memory deficits  Disposition/Need for in-Hospital Stay- patient unable to be discharged at this time due to --Awaiting transfer to residential hospice with comfort care when bed available -Patient From: home D/C Place: Hospice facility versus  hospice at home  barriers: -Awaiting transfer to residential hospice with comfort care when bed available  Code Status : DNR  Family Communication:  (patient is alert, awake and coherent) -Discussed with husband and daughter at bedside  Consults  : Cardiology/GI/palliative care  Discharge Condition: Anticipate date within days to weeks  Follow UP  Diet and Activity recommendation:  As advised  Discharge Instructions    Discharge Instructions    Diet general   Complete by: As directed    Discharge instructions   Complete  by: As directed    --Discharge to Hospice house with full comfort measures   Increase activity slowly   Complete by: As directed        Discharge Medications     Allergies as of 05/23/2019   No Known Allergies     Medication List    STOP taking these medications   acetaminophen 325 MG tablet Commonly known as: TYLENOL   albuterol 108 (90 Base) MCG/ACT inhaler Commonly known as: VENTOLIN HFA   ALPRAZolam 0.25 MG tablet Commonly known as: Xanax   aspirin EC 81 MG tablet   carvedilol 6.25 MG tablet Commonly known as: COREG   clopidogrel 75 MG tablet Commonly known as: PLAVIX   ferrous sulfate 325 (65 FE) MG tablet   furosemide 40 MG tablet Commonly known as: LASIX   hydrALAZINE 25 MG tablet Commonly known as: APRESOLINE   isosorbide dinitrate 5 MG tablet Commonly known as: ISORDIL   multivitamin tablet   nitroGLYCERIN 0.4 MG SL tablet Commonly known as: NITROSTAT   potassium chloride SA 20 MEQ tablet Commonly known as: Klor-Con M20   rosuvastatin 5 MG tablet Commonly known as: CRESTOR   vitamin B-12 250 MCG tablet Commonly known as: CYANOCOBALAMIN       Major procedures and Radiology Reports - PLEASE review detailed and final reports for all details, in brief -   DG Chest Port 1 View  Result Date: 05/18/2019 CLINICAL DATA:  Shortness of breath. EXAM: PORTABLE CHEST 1 VIEW COMPARISON:  05/02/2018 FINDINGS: Prior median sternotomy and scratched it prior CABG. Stable severe cardiomegaly. Prominent right lower lobe atelectasis/infiltrate. Small right pleural effusion. No pneumothorax. Degenerative change thoracic spine. IMPRESSION: 1.  Prior CABG.  Stable severe cardiomegaly. 2. Prominent right lower lobe atelectasis/infiltrate. Small right pleural effusion. Electronically Signed   By: Maisie Fus  Register   On: 05/18/2019 14:11   DG Chest Portable 1 View  Result Date: 05/02/2019 CLINICAL DATA:  Short of breath, heart failure EXAM: PORTABLE CHEST 1 VIEW  COMPARISON:  04/21/2019 FINDINGS: Single frontal view of the chest demonstrates persistent enlargement of the cardiac silhouette. Postsurgical changes from prior CABG. Background emphysema unchanged. There is chronic central vascular congestion without airspace disease, effusion, or pneumothorax. No acute bony abnormality. IMPRESSION: 1. Stable enlarged cardiac silhouette. 2. Chronic central vascular congestion, no acute process. 3. Emphysema. Electronically Signed   By: Sharlet Salina M.D.   On: 05/02/2019 19:10    Micro Results   Recent Results (from the past 240 hour(s))  Respiratory Panel by RT PCR (Flu A&B, Covid) - Nasopharyngeal Swab     Status: None   Collection Time: 05/18/19  6:48 PM   Specimen: Nasopharyngeal Swab  Result  Value Ref Range Status   SARS Coronavirus 2 by RT PCR NEGATIVE NEGATIVE Final    Comment: (NOTE) SARS-CoV-2 target nucleic acids are NOT DETECTED. The SARS-CoV-2 RNA is generally detectable in upper respiratoy specimens during the acute phase of infection. The lowest concentration of SARS-CoV-2 viral copies this assay can detect is 131 copies/mL. A negative result does not preclude SARS-Cov-2 infection and should not be used as the sole basis for treatment or other patient management decisions. A negative result may occur with  improper specimen collection/handling, submission of specimen other than nasopharyngeal swab, presence of viral mutation(s) within the areas targeted by this assay, and inadequate number of viral copies (<131 copies/mL). A negative result must be combined with clinical observations, patient history, and epidemiological information. The expected result is Negative. Fact Sheet for Patients:  https://www.moore.com/ Fact Sheet for Healthcare Providers:  https://www.young.biz/ This test is not yet ap proved or cleared by the [place] FDA and  has been authorized for detection and/or diagnosis of  SARS-CoV-2 by FDA under an Emergency Use Authorization (EUA). This EUA will remain  in effect (meaning this test can be used) for the duration of the COVID-19 declaration under Section 564(b)(1) of the Act, 21 U.S.C. section 360bbb-3(b)(1), unless the authorization is terminated or revoked sooner.    Influenza A by PCR NEGATIVE NEGATIVE Final   Influenza B by PCR NEGATIVE NEGATIVE Final    Comment: (NOTE) The Xpert Xpress SARS-CoV-2/FLU/RSV assay is intended as an aid in  the diagnosis of influenza from Nasopharyngeal swab specimens and  should not be used as a sole basis for treatment. Nasal washings and  aspirates are unacceptable for Xpert Xpress SARS-CoV-2/FLU/RSV  testing. Fact Sheet for Patients: https://www.moore.com/ Fact Sheet for Healthcare Providers: https://www.young.biz/ This test is not yet approved or cleared by the [place] FDA and  has been authorized for detection and/or diagnosis of SARS-CoV-2 by  FDA under an Emergency Use Authorization (EUA). This EUA will remain  in effect (meaning this test can be used) for the duration of the  Covid-19 declaration under Section 564(b)(1) of the Act, 21  U.S.C. section 360bbb-3(b)(1), unless the authorization is  terminated or revoked. Performed at [facility], [address] [place], [place] [address]   MRSA PCR Screening     Status: None   Collection Time: 05/21/19  6:42 PM   Specimen: Nasal Mucosa; Nasopharyngeal  Result Value Ref Range Status   MRSA by PCR NEGATIVE NEGATIVE Final    Comment:        The GeneXpert MRSA Assay (FDA approved for NASAL specimens only), is one component of a comprehensive MRSA colonization surveillance program. It is not intended to diagnose MRSA infection nor to guide or monitor treatment for MRSA infections. Performed at [facility], [address], [place], [place] [address]     Today   Subjective    Delani Kohli today has no  fevers Husband at bedside         --Discharge to Hospice house with full comfort measures  Patient has been seen and examined prior to discharge   Objective   Blood pressure 124/71, pulse 86, temperature 98 F (36.7 C), temperature source Axillary, resp. rate 16, height 5' (1.524 m), weight 40.2 kg, SpO2 94 %.   Intake/Output Summary (Last 24 hours) at 05/23/2019 1619 Last data filed at 05/23/2019 1456 Gross per 24 hour  Intake 360 ml  Output 1150 ml  Net -790 ml   Exam Gen:- Awake Alert, dyspnea  on exertion HEENT:- [place].AT, No sclera icterus Neck-Supple Neck,No JVD,.  Lungs-diminished in bases with bibasilar rales CV- S1, S2 normal, irregularly irregular,, prior sternotomy scar Abd-  +ve B.Sounds, Abd Soft, No tenderness,    Extremity/Skin:-2+ pitting edema, pedal pulses present  Psych-affect is anxious,, baseline memory and cognitive deficits  neuro-generalized weakness, no new focal deficits, no tremors   Data Review   CBC w Diff:  Lab Results  Component Value Date   WBC 12.5 (H) 05/21/2019   HGB 8.4 (L) 05/21/2019   HCT 26.8 (L) 05/21/2019   PLT 258 05/21/2019   LYMPHOPCT 6 05/21/2019   MONOPCT 9 05/21/2019   EOSPCT 0 05/21/2019   BASOPCT 0 05/21/2019    CMP:  Lab Results  Component Value Date   NA 128 (L) 05/21/2019   K 4.1 05/21/2019   CL 89 (L) 05/21/2019   CO2 29 05/21/2019   BUN 30 (H) 05/21/2019   CREATININE 1.14 (H) 05/21/2019   PROT 5.5 (L) 05/21/2019   ALBUMIN 2.4 (L) 05/21/2019   BILITOT 1.1 05/21/2019   ALKPHOS 70 05/21/2019   AST 61 (H) 05/21/2019   ALT 29 05/21/2019  . Total Discharge time is about 33 minutes  Roxan Hockey M.D on 05/23/2019 at 4:19 PM  Go to [email or web address] -  for contact info  [facility] - Office  [phone]

## 2019-05-24 DIAGNOSIS — Z515 Encounter for palliative care: Secondary | ICD-10-CM

## 2019-05-24 NOTE — Plan of Care (Signed)
  Problem: Education: Goal: Knowledge of General Education information will improve Description: Including pain rating scale, medication(s)/side effects and non-pharmacologic comfort measures Outcome: Not Progressing   Problem: Health Behavior/Discharge Planning: Goal: Ability to manage health-related needs will improve Outcome: Not Progressing   

## 2019-05-24 NOTE — Progress Notes (Signed)
Patient Demographics:    Susan [name], is a 74 y.o. female, DOB - 1945/05/07, GYB:[id number]  Admit date - 05/23/2019   Admitting Physician Susan President Mariea Clonts, MD  Outpatient Primary MD for the patient is Susan Peri, MD  LOS - 1   No chief complaint on file.       Subjective:    Susan [name] today has no fevers, no emesis,  No chest pain,   -Patient's husband, daughter and grandson at bedside, questions answered -She appears comfortable  Assessment  & Plan :    Principal Problem:   NSTEMI (non-ST elevated myocardial infarction) (HCC) Active Problems:   Acute on chronic systolic and diastolic heart failure, NYHA class 1 (HCC)   Acute exacerbation of CHF (congestive heart failure) (HCC)   Palliative care encounter   Terminal care   Anxiety attack/Panic Attack/Insomnia   End of life care   Failure to thrive in adult   Ischemic cardiomyopathy   SEMI (subendocardial myocardial infarction) (HCC)   CAD in native artery   Brief Summary:- 74 y.o.femalewith medical history significant ofCAD (s/p CABG x5 in 2000 with cath in11/2020showing 2/5 patent grafts with patent LIMA-LAD, patent SVG-distal RCA, occluded SVG-D1 and occluded seq-SVG-OM1-OM2 with medical management recommended, repeat cath in 11/2018 showing stable anatomy with patent LIMA-LAD and patent SVG-dRCA with 50% mid-graft stenosis and medical therapy recommended), chronic combined systolic and diastolic (EF 20-25% by echo in 12/2016, at 45-50% by echo in 05/2017,EF 25 to30% by repeat echo in 01/19/19), HTN, HLD, COPD / emphysema, tobacco useadmitted on 05/18/19 with worsening dyspnea and worsening LE edema and concerns about CHF exacer and Rt LL PNA--- --- BNP over 4500 and clinical findings consistent with CHF exacerbation -Developed chest pain on 05/20/2019 with EKG ST segment changes/depression and troponin elevation above 7,500, was  started on IV heparin on 05/20/2019 but developed GI bleed -Family requested comfort care at this time -Awaiting transfer to residential hospice with comfort care when bed available  A/p 1)HFrEF/chronic systolic dysfunction CHF nonischemic or myopathy ---last known EF 25 to 30% based on echo from January 2021---patient failed oral diuretics at home PTA -BNP > 4,500 (was only 496 on 01/18/19) - -Developed chest pain on 05/20/2019 with EKG ST segment changes/depression and troponin elevation above 7,500, was started on IV heparin on 05/20/2019 but developed GI bleed -Family requested comfort care at this time   2)COPD/emphysema/ongoing tobacco abuse---continue as needed bronchodilators =- Nicotine patch   3)CAD--status post prior CABG and prior angioplasty with stenting--- --Developed chest pain on 05/20/2019 with EKG ST segment changes/depression and troponin elevation above 7,500, was started on IV heparin on 05/20/2019 but developed GI bleed -Family requested comfort care at this time  4)History of iron Deficiency Anemia--- had GI bleed, hemoglobin dropped to 8.4   5)Chronic Hyponatremia----comfort care  6)FTT in an Adult--- moderate PCM, serum albumin is 3.1--- Ensure twice daily advised along with Magic cup  7)???Rt LL Infiltrate--- -comfort care measures  8)Anxiety DO/Panic Attacks/Insomnia--- xanax 0.25 mg q 8 hrs prn  9)Social/Ethics--- given age, low EF and overall comorbid conditions--- overall prognosis is poor --Palliative care consult requested to help further delineate goals of care --Developed chest pain on 05/20/2019 with EKG ST segment changes/depression and troponin elevation above 7,500, was  started on IV heparin on 05/20/2019 but developed GI bleed -Family requested comfort care at this time  -Cardiology, palliative care and GI consults appreciated  10)Dementia--patient with mild to moderate cognitive and memory deficits  Disposition/Need for in-Hospital  Stay- patient unable to be discharged at this time due to --Awaiting transfer to residential hospice with comfort care when bed available -Patient From: home D/C Place: Hospice facility versus hospice at home  barriers:-Awaiting transfer to residential hospice with comfort care when bed available   Code Status:DNR Family Communication:  -Discussed with husband and daughter at bedside  DVT Prophylaxis  : Comfort care*  Lab Results  Component Value Date   PLT 258 05/21/2019    Inpatient Medications  Scheduled Meds: Continuous Infusions: . chlorproMAZINE (THORAZINE) IV     PRN Meds:.acetaminophen **OR** acetaminophen, antiseptic oral rinse, bisacodyl, chlorproMAZINE (THORAZINE) IV, diphenhydrAMINE, fentaNYL (SUBLIMAZE) injection, glycopyrrolate **OR** glycopyrrolate **OR** glycopyrrolate, haloperidol **OR** [DISCONTINUED] haloperidol **OR** haloperidol lactate, LORazepam **OR** [DISCONTINUED] LORazepam **OR** LORazepam, ondansetron **OR** ondansetron (ZOFRAN) IV, polyvinyl alcohol    Anti-infectives (From admission, onward)   None        Objective:   Vitals:   05/23/19 1700 05/23/19 1948  BP: 124/71   Pulse: 86   Resp: 16   Temp: 98 F (36.7 C)   TempSrc: Oral   SpO2: 94% 94%  Weight: 40.8 kg   Height: 5' (1.524 m)     Wt Readings from Last 3 Encounters:  05/23/19 40.8 kg  05/18/19 40.2 kg  05/14/19 40.5 kg     Intake/Output Summary (Last 24 hours) at 05/24/2019 1805 Last data filed at 05/24/2019 0600 Gross per 24 hour  Intake 120 ml  Output --  Net 120 ml     Physical Exam  Gen-resting comfortably, HEENT:- [place].AT, No sclera icterus Nose- [place] 2L/min Neck-Supple Neck,No JVD,.  Lungs-diminished in bases with bibasilar rales CV- S1, S2 normal, irregularly irregular,, prior sternotomy scar Abd- +ve B.Sounds, Abd Soft, No tenderness,  Extremity/Skin:-2+ pitting edema, pedal pulses present  Psych-affect is appropriate,, baseline memory and  cognitive deficits  neuro-generalized weakness, no new focal deficits, no tremors   Data Review:   Micro Results Recent Results (from the past 240 hour(s))  Respiratory Panel by RT PCR (Flu A&B, Covid) - Nasopharyngeal Swab     Status: None   Collection Time: 05/18/19  6:48 PM   Specimen: Nasopharyngeal Swab  Result Value Ref Range Status   SARS Coronavirus 2 by RT PCR NEGATIVE NEGATIVE Final    Comment: (NOTE) SARS-CoV-2 target nucleic acids are NOT DETECTED. The SARS-CoV-2 RNA is generally detectable in upper respiratoy specimens during the acute phase of infection. The lowest concentration of SARS-CoV-2 viral copies this assay can detect is 131 copies/mL. A negative result does not preclude SARS-Cov-2 infection and should not be used as the sole basis for treatment or other patient management decisions. A negative result may occur with  improper specimen collection/handling, submission of specimen other than nasopharyngeal swab, presence of viral mutation(s) within the areas targeted by this assay, and inadequate number of viral copies (<131 copies/mL). A negative result must be combined with clinical observations, patient history, and epidemiological information. The expected result is Negative. Fact Sheet for Patients:  PinkCheek.be Fact Sheet for Healthcare Providers:  GravelBags.it This test is not yet ap proved or cleared by the [place] FDA and  has been authorized for detection and/or diagnosis of SARS-CoV-2 by FDA under an Emergency Use Authorization (EUA). This EUA will remain  in  effect (meaning this test can be used) for the duration of the COVID-19 declaration under Section 564(b)(1) of the Act, 21 U.S.C. section 360bbb-3(b)(1), unless the authorization is terminated or revoked sooner.    Influenza A by PCR NEGATIVE NEGATIVE Final   Influenza B by PCR NEGATIVE NEGATIVE Final    Comment: (NOTE) The  Xpert Xpress SARS-CoV-2/FLU/RSV assay is intended as an aid in  the diagnosis of influenza from Nasopharyngeal swab specimens and  should not be used as a sole basis for treatment. Nasal washings and  aspirates are unacceptable for Xpert Xpress SARS-CoV-2/FLU/RSV  testing. Fact Sheet for Patients: https://www.moore.com/ Fact Sheet for Healthcare Providers: https://www.young.biz/ This test is not yet approved or cleared by the [place] FDA and  has been authorized for detection and/or diagnosis of SARS-CoV-2 by  FDA under an Emergency Use Authorization (EUA). This EUA will remain  in effect (meaning this test can be used) for the duration of the  Covid-19 declaration under Section 564(b)(1) of the Act, 21  U.S.C. section 360bbb-3(b)(1), unless the authorization is  terminated or revoked. Performed at [facility], [address], [place], [place] [address]   MRSA PCR Screening     Status: None   Collection Time: 05/21/19  6:42 PM   Specimen: Nasal Mucosa; Nasopharyngeal  Result Value Ref Range Status   MRSA by PCR NEGATIVE NEGATIVE Final    Comment:        The GeneXpert MRSA Assay (FDA approved for NASAL specimens only), is one component of a comprehensive MRSA colonization surveillance program. It is not intended to diagnose MRSA infection nor to guide or monitor treatment for MRSA infections. Performed at [facility], [address], [place], [place] [address]     Radiology Reports DG Chest [place] 1 View  Result Date: 05/18/2019 CLINICAL DATA:  Shortness of breath. EXAM: PORTABLE CHEST 1 VIEW COMPARISON:  05/02/2018 FINDINGS: Prior median sternotomy and scratched it prior CABG. Stable severe cardiomegaly. Prominent right lower lobe atelectasis/infiltrate. Small right pleural effusion. No pneumothorax. Degenerative change thoracic spine. IMPRESSION: 1.  Prior CABG.  Stable severe cardiomegaly. 2. Prominent right lower lobe  atelectasis/infiltrate. Small right pleural effusion. Electronically Signed   By: Maisie Fus  Register   On: 05/18/2019 14:11   DG Chest Portable 1 View  Result Date: 05/02/2019 CLINICAL DATA:  Short of breath, heart failure EXAM: PORTABLE CHEST 1 VIEW COMPARISON:  04/21/2019 FINDINGS: Single frontal view of the chest demonstrates persistent enlargement of the cardiac silhouette. Postsurgical changes from prior CABG. Background emphysema unchanged. There is chronic central vascular congestion without airspace disease, effusion, or pneumothorax. No acute bony abnormality. IMPRESSION: 1. Stable enlarged cardiac silhouette. 2. Chronic central vascular congestion, no acute process. 3. Emphysema. Electronically Signed   By: Sharlet Salina M.D.   On: 05/02/2019 19:10     CBC Recent Labs  Lab 05/18/19 1420 05/21/19 0733 05/21/19 1007  WBC 11.0* 12.5*  --   HGB 13.9 10.6* 8.4*  HCT 42.0 33.0* 26.8*  PLT 267 258  --   MCV 102.4* 104.4*  --   MCH 33.9 33.5  --   MCHC 33.1 32.1  --   RDW 13.7 14.0  --   LYMPHSABS 1.0 0.7  --   MONOABS 0.7 1.1*  --   EOSABS 0.1 0.0  --   BASOSABS 0.0 0.0  --     Chemistries  Recent Labs  Lab 05/18/19 1420 05/19/19 0700 05/20/19 0621 05/21/19 0407 05/21/19 0733  NA 124* 128* 127* 129* 128*  K 4.5 5.0 5.9* 4.0 4.1  CL 85* 88* 89* 90* 89*  CO2 27 27 25 27 29   GLUCOSE 126* 131* 126* 124* 116*  BUN 20 23 30* 29* 30*  CREATININE 1.16* 1.13* 1.19* 1.14* 1.14*  CALCIUM 8.6* 8.6* 8.7* 7.9* 8.1*  AST 37  --   --   --  61*  ALT 34  --   --   --  29  ALKPHOS 80  --   --   --  70  BILITOT 1.0  --   --   --  1.1   ------------------------------------------------------------------------------------------------------------------ No results for input(s): CHOL, HDL, LDLCALC, TRIG, CHOLHDL, LDLDIRECT in the last 72 hours.  No results found for:  HGBA1C ------------------------------------------------------------------------------------------------------------------ No results for input(s): TSH, T4TOTAL, T3FREE, THYROIDAB in the last 72 hours.  Invalid input(s): FREET3 ------------------------------------------------------------------------------------------------------------------ No results for input(s): VITAMINB12, FOLATE, FERRITIN, TIBC, IRON, RETICCTPCT in the last 72 hours.  Coagulation profile Recent Labs  Lab 05/21/19 0733  INR 1.1    No results for input(s): DDIMER in the last 72 hours.  Cardiac Enzymes No results for input(s): CKMB, TROPONINI, MYOGLOBIN in the last 168 hours.  Invalid input(s): CK ------------------------------------------------------------------------------------------------------------------    Component Value Date/Time   BNP >4,500.0 (H) 05/18/2019 1421     07/18/2019 M.D on 05/24/2019 at 6:05 PM  Go to [email or web address] - for contact info  [facility] - Office  [phone]

## 2019-05-25 LAB — SARS CORONAVIRUS 2 BY RT PCR (HOSPITAL ORDER, PERFORMED IN ~~LOC~~ HOSPITAL LAB): SARS Coronavirus 2: NEGATIVE

## 2019-05-25 NOTE — Plan of Care (Signed)

## 2019-05-25 NOTE — Progress Notes (Signed)
Nsg Discharge Note  Admit Date:  05/23/2019 Discharge date: 05/25/2019   Susan [name] to be D/C'd to [facility] per MD order.  AVS completed.  Copy for chart, and copy for patient signed, and dated. Patient/caregiver able to verbalize understanding.  Discharge Medication: Allergies as of 05/25/2019   No Known Allergies     Medication List    You have not been prescribed any medications.     Discharge Assessment: Vitals:   05/23/19 1948 05/24/19 1951  BP:    Pulse:    Resp:    Temp:    SpO2: 94% 97%   Skin clean, dry and intact without evidence of skin break down, no evidence of skin tears noted. IV catheter discontinued intact. Site without signs and symptoms of complications - no redness or edema noted at insertion site, patient denies c/o pain - only slight tenderness at site.  Dressing with slight pressure applied.  D/c Instructions-Education: Discharge instructions given to patient/family with verbalized understanding. D/c education completed with patient/family including follow up instructions, medication list, d/c activities limitations if indicated, with other d/c instructions as indicated by MD - patient able to verbalize understanding, all questions fully answered. Patient instructed to return to ED, call 911, or call MD for any changes in condition.  Patient escorted via [facility], to [facility] via private ambulance.  Susan Civil, RN 05/25/2019 3:33 PM

## 2019-05-25 NOTE — Discharge Summary (Signed)
Susan [name], is a 74 y.o. female  DOB 1945/02/29  MRN [id number].  Admission date:  05/23/2019  Admitting Physician  Shon Hale, MD  Discharge Date:  05/25/2019   Primary MD  [name], MD  Recommendations for primary care physician for things to follow:    Discharge to residential hospice with for comfort care  Admission Diagnosis  Acute exacerbation of CHF (congestive heart failure) (HCC) [I50.9]   Discharge Diagnosis  Acute exacerbation of CHF (congestive heart failure) (HCC) [I50.9]    Principal Problem:   NSTEMI (non-ST elevated myocardial infarction) (HCC) Active Problems:   Acute on chronic systolic and diastolic heart failure, NYHA class 1 (HCC)   Acute exacerbation of CHF (congestive heart failure) (HCC)   Palliative care encounter   Terminal care   Anxiety attack/Panic Attack/Insomnia   End of life care   Failure to thrive in adult   Ischemic cardiomyopathy   SEMI (subendocardial myocardial infarction) (HCC)   CAD in native artery      Past Medical History:  Diagnosis Date  . Acute on chronic systolic and diastolic heart failure, NYHA class 1 (HCC) 11/21/2018  . Anxiety   . CAD (coronary artery disease)    a. s/p CABG x5 in 2000 b. cath in 02/2017 showing 2/5 patent grafts with patent LIMA-LAD, patent SVG-distal RCA, occluded SVG-D1 and occluded seq-SVG-OM1-OM2 with medical management recommended and possible PCI of distal RCA branches in the future if recurrent anginal symptoms  . CHF (congestive heart failure) (HCC)    a. EF 20-25% by echo in 12/2016 b. EF 45-50% by echo in 05/2017 c. EF 30% by echo in 11/2018  . Chronic bronchitis (HCC)   . CKD (chronic kidney disease) stage 3, GFR 30-59 ml/min 11/21/2018  . COPD (chronic obstructive pulmonary disease) (HCC)   . Dizziness   . Esophageal reflux   . Essential hypertension   . Fatigue   . HLD (hyperlipidemia)  11/21/2018  . HTN (hypertension) 11/21/2018  . Hypercholesteremia   . Hyperkalemia   . Ovarian failure   . SOB (shortness of breath)     Past Surgical History:  Procedure Laterality Date  . ABDOMINAL HYSTERECTOMY    . APPENDECTOMY    . BIOPSY  01/22/2019   Procedure: BIOPSY;  Surgeon: Beverley Fiedler, MD;  Location: [facility] ENDOSCOPY;  Service: Gastroenterology;;  . BREAST BIOPSY Right   . CARDIAC CATHETERIZATION  2000  . CORONARY ARTERY BYPASS GRAFT  2000   CABG X5  . ESOPHAGOGASTRODUODENOSCOPY (EGD) WITH PROPOFOL N/A 01/22/2019   Procedure: ESOPHAGOGASTRODUODENOSCOPY (EGD) WITH PROPOFOL;  Surgeon: Beverley Fiedler, MD;  Location: [facility] ENDOSCOPY;  Service: Gastroenterology;  Laterality: N/A;  . LEFT HEART CATH AND CORS/GRAFTS ANGIOGRAPHY N/A 02/17/2017   Procedure: LEFT HEART CATH AND CORS/GRAFTS ANGIOGRAPHY;  Surgeon: Kathleene Hazel, MD;  Location: [facility] INVASIVE CV LAB;  Service: Cardiovascular;  Laterality: N/A;  . LEFT HEART CATH AND CORS/GRAFTS ANGIOGRAPHY N/A 11/20/2018   Procedure: LEFT HEART CATH AND CORS/GRAFTS ANGIOGRAPHY;  Surgeon: Yvonne Kendall, MD;  Location: [place] CV LAB;  Service: Cardiovascular;  Laterality: N/A;     HPI  from the history and physical done on the day of admission:     Brief Summary:- 74 y.o.femalewith medical history significant ofCAD (s/p CABG x5 in 2000 with cath in11/2020showing 2/5 patent grafts with patent LIMA-LAD, patent SVG-distal RCA, occluded SVG-D1 and occluded seq-SVG-OM1-OM2 with medical management recommended, repeat cath in 11/2018 showing stable anatomy with patent LIMA-LAD and patent SVG-dRCA with 50% mid-graft stenosis and medical therapy recommended), chronic combined systolic and diastolic (EF 52-84% by echo in 12/2016, at 45-50% by echo in 05/2017,EF 25 to30% by repeat echo in 01/19/19), HTN, HLD, COPD / emphysema, tobacco useadmitted on 05/18/19 with worsening dyspnea and worsening LE edema and concerns about CHF exacer and  Rt LL PNA--- --- BNP over 4500 and clinical findings consistent with CHF exacerbation -Developed chest pain on 05/20/2019 with EKG ST segment changes/depression and troponin elevation above 7,500, was started on IV heparin on 05/20/2019 but developed GI bleed -Family requested comfort care at this time -Awaiting transfer to residential hospice with comfort care when bed available  -Discharge to residential hospice with for comfort care    Hospital Course:   Brief Summary:- 74 y.o.femalewith medical history significant ofCAD (s/p CABG x5 in 2000 with cath in11/2020showing 2/5 patent grafts with patent LIMA-LAD, patent SVG-distal RCA, occluded SVG-D1 and occluded seq-SVG-OM1-OM2 with medical management recommended, repeat cath in 11/2018 showing stable anatomy with patent LIMA-LAD and patent SVG-dRCA with 50% mid-graft stenosis and medical therapy recommended), chronic combined systolic and diastolic (EF 13-24% by echo in 12/2016, at 45-50% by echo in 05/2017,EF 25 to30% by repeat echo in 01/19/19), HTN, HLD, COPD / emphysema, tobacco useadmitted on 05/18/19 with worsening dyspnea and worsening LE edema and concerns about CHF exacer and Rt LL PNA--- --- BNP over 4500 and clinical findings consistent with CHF exacerbation -Developed chest pain on 05/20/2019 with EKG ST segment changes/depression and troponin elevation above 7,500, was started on IV heparin on 05/20/2019 but developed GI bleed -Family requested comfort care at this time   -Discharge to residential hospice with for comfort care  A/p 1)HFrEF/chronic systolic dysfunction CHF nonischemic or myopathy ---last known EF 25 to 30% based on echo from January 2021---patient failed oral diuretics at home PTA -BNP > 4,500 (was only 496 on 01/18/19) - -Developed chest pain on 05/20/2019 with EKG ST segment changes/depression and troponin elevation above 7,500, was started on IV heparin on 05/20/2019 but developed GI bleed -Family requested  comfort care at this time   2)COPD/emphysema/ongoing tobacco abuse---continue as needed bronchodilators =- Nicotine patch   3)CAD--status post prior CABG and prior angioplasty with stenting--- --Developed chest pain on 05/20/2019 with EKG ST segment changes/depression and troponin elevation above 7,500, was started on IV heparin on 05/20/2019 but developed GI bleed -Family requested comfort care at this time  4)History of iron Deficiency Anemia--- had GI bleed, hemoglobin dropped to 8.4   5)Chronic Hyponatremia----comfort care  6)FTT in an Adult--- moderate PCM, serum albumin is 3.1--- Ensure twice daily advised along with Magic cup  7)???Rt LL Infiltrate--- -comfort care measures  8)Anxiety DO/Panic Attacks/Insomnia--- xanax 0.25 mg q 8 hrs prn  9)Social/Ethics--- given age, low EF and overall comorbid conditions--- overall prognosis is poor --Palliative care consult requested to help further delineate goals of care --Developed chest pain on 05/20/2019 with EKG ST segment changes/depression and troponin elevation above 7,500, was started on IV heparin on 05/20/2019 but developed GI bleed -Family requested comfort  care at this time  -Cardiology, palliative care and GI consults appreciated  10)Dementia--patient with mild to moderate cognitive and memory deficits  Disposition- -Discharge to residential hospice with for comfort care  Code Status:DNR Family Communication:  -Discussed with husband and daughter at bedside   Discharge Condition: Overall prognosis is grave, patient is actively dying  Diet and Activity recommendation:  As advised  Discharge Instructions  --Discharge to residential hospice with for comfort care  Discharge Instructions    Bed rest   Complete by: As directed    Diet - low sodium heart healthy   Complete by: As directed    Discharge instructions   Complete by: As directed    Discharge to residential hospice with for comfort care          Discharge Medications     Allergies as of 05/25/2019   No Known Allergies     Medication List    You have not been prescribed any medications.     Major procedures and Radiology Reports - PLEASE review detailed and final reports for all details, in brief -     DG Chest Port 1 View  Result Date: 05/18/2019 CLINICAL DATA:  Shortness of breath. EXAM: PORTABLE CHEST 1 VIEW COMPARISON:  05/02/2018 FINDINGS: Prior median sternotomy and scratched it prior CABG. Stable severe cardiomegaly. Prominent right lower lobe atelectasis/infiltrate. Small right pleural effusion. No pneumothorax. Degenerative change thoracic spine. IMPRESSION: 1.  Prior CABG.  Stable severe cardiomegaly. 2. Prominent right lower lobe atelectasis/infiltrate. Small right pleural effusion. Electronically Signed   By: Maisie Fus  Register   On: 05/18/2019 14:11   DG Chest Portable 1 View  Result Date: 05/02/2019 CLINICAL DATA:  Short of breath, heart failure EXAM: PORTABLE CHEST 1 VIEW COMPARISON:  04/21/2019 FINDINGS: Single frontal view of the chest demonstrates persistent enlargement of the cardiac silhouette. Postsurgical changes from prior CABG. Background emphysema unchanged. There is chronic central vascular congestion without airspace disease, effusion, or pneumothorax. No acute bony abnormality. IMPRESSION: 1. Stable enlarged cardiac silhouette. 2. Chronic central vascular congestion, no acute process. 3. Emphysema. Electronically Signed   By: Sharlet Salina M.D.   On: 05/02/2019 19:10    Micro Results   Recent Results (from the past 240 hour(s))  Respiratory Panel by RT PCR (Flu A&B, Covid) - Nasopharyngeal Swab     Status: None   Collection Time: 05/18/19  6:48 PM   Specimen: Nasopharyngeal Swab  Result Value Ref Range Status   SARS Coronavirus 2 by RT PCR NEGATIVE NEGATIVE Final    Comment: (NOTE) SARS-CoV-2 target nucleic acids are NOT DETECTED. The SARS-CoV-2 RNA is generally detectable in upper  respiratoy specimens during the acute phase of infection. The lowest concentration of SARS-CoV-2 viral copies this assay can detect is 131 copies/mL. A negative result does not preclude SARS-Cov-2 infection and should not be used as the sole basis for treatment or other patient management decisions. A negative result may occur with  improper specimen collection/handling, submission of specimen other than nasopharyngeal swab, presence of viral mutation(s) within the areas targeted by this assay, and inadequate number of viral copies (<131 copies/mL). A negative result must be combined with clinical observations, patient history, and epidemiological information. The expected result is Negative. Fact Sheet for Patients:  https://www.moore.com/ Fact Sheet for Healthcare Providers:  https://www.young.biz/ This test is not yet ap proved or cleared by the [place] FDA and  has been authorized for detection and/or diagnosis of SARS-CoV-2 by FDA under an Emergency Use Authorization (  EUA). This EUA will remain  in effect (meaning this test can be used) for the duration of the COVID-19 declaration under Section 564(b)(1) of the Act, 21 U.S.C. section 360bbb-3(b)(1), unless the authorization is terminated or revoked sooner.    Influenza A by PCR NEGATIVE NEGATIVE Final   Influenza B by PCR NEGATIVE NEGATIVE Final    Comment: (NOTE) The Xpert Xpress SARS-CoV-2/FLU/RSV assay is intended as an aid in  the diagnosis of influenza from Nasopharyngeal swab specimens and  should not be used as a sole basis for treatment. Nasal washings and  aspirates are unacceptable for Xpert Xpress SARS-CoV-2/FLU/RSV  testing. Fact Sheet for Patients: https://www.moore.com/ Fact Sheet for Healthcare Providers: https://www.young.biz/ This test is not yet approved or cleared by the [place] FDA and  has been authorized for  detection and/or diagnosis of SARS-CoV-2 by  FDA under an Emergency Use Authorization (EUA). This EUA will remain  in effect (meaning this test can be used) for the duration of the  Covid-19 declaration under Section 564(b)(1) of the Act, 21  U.S.C. section 360bbb-3(b)(1), unless the authorization is  terminated or revoked. Performed at [facility], [address] [place], [place] [address]   MRSA PCR Screening     Status: None   Collection Time: 05/21/19  6:42 PM   Specimen: Nasal Mucosa; Nasopharyngeal  Result Value Ref Range Status   MRSA by PCR NEGATIVE NEGATIVE Final    Comment:        The GeneXpert MRSA Assay (FDA approved for NASAL specimens only), is one component of a comprehensive MRSA colonization surveillance program. It is not intended to diagnose MRSA infection nor to guide or monitor treatment for MRSA infections. Performed at [facility], [address] [place], [place] [address]   SARS Coronavirus 2 by RT PCR (hospital order, performed in [facility] hospital lab) Nasopharyngeal Nasopharyngeal Swab     Status: None   Collection Time: 05/25/19 11:43 AM   Specimen: Nasopharyngeal Swab  Result Value Ref Range Status   SARS Coronavirus 2 NEGATIVE NEGATIVE Final    Comment: (NOTE) SARS-CoV-2 target nucleic acids are NOT DETECTED. The SARS-CoV-2 RNA is generally detectable in upper and lower respiratory specimens during the acute phase of infection. The lowest concentration of SARS-CoV-2 viral copies this assay can detect is 250 copies / mL. A negative result does not preclude SARS-CoV-2 infection and should not be used as the sole basis for treatment or other patient management decisions.  A negative result may occur with improper specimen collection / handling, submission of specimen other than nasopharyngeal swab, presence of viral mutation(s) within the areas targeted by this assay, and inadequate number of viral copies (<250 copies / mL). A negative result  must be combined with clinical observations, patient history, and epidemiological information. Fact Sheet for Patients:   BoilerBrush.com.cy Fact Sheet for Healthcare Providers: https://pope.com/ This test is not yet approved or cleared  by the [place] FDA and has been authorized for detection and/or diagnosis of SARS-CoV-2 by FDA under an Emergency Use Authorization (EUA).  This EUA will remain in effect (meaning this test can be used) for the duration of the COVID-19 declaration under Section 564(b)(1) of the Act, 21 U.S.C. section 360bbb-3(b)(1), unless the authorization is terminated or revoked sooner. Performed at [facility], [address] [place], [place] [address]        Today   Subjective    [name] --- -Discharge to residential hospice with for comfort care  Patient has been seen and examined prior to discharge   Objective   Blood pressure 124/71, pulse 86, temperature 98 F (36.7 C), temperature source Oral, resp. rate 16, height 5' (1.524 m), weight 40.8 kg, SpO2 97 %.  No intake or output data in the 24 hours ending 05/25/19 1529  Exam Gen:-Resting comfortably, husband  at bedside HEENT:- [place].AT, No sclera icterus Neck-Supple Neck,No JVD,.  Lungs-diminished breath sounds, no wheezing CV- S1, S2 normal, regular Abd-  +ve B.Sounds, Abd Soft, No tenderness,    Extremity/Skin:- +ve  edema,   good pulses Psych-affect is appropriate, intermittent confusion and lethargy  neuro-sleepy on and off  Data Review   CBC w Diff:  Lab Results  Component Value Date   WBC 12.5 (H) 05/21/2019   HGB 8.4 (L) 05/21/2019   HCT 26.8 (L) 05/21/2019   PLT 258 05/21/2019   LYMPHOPCT 6 05/21/2019   MONOPCT 9 05/21/2019   EOSPCT 0 05/21/2019   BASOPCT 0 05/21/2019    CMP:  Lab Results  Component Value Date   NA 128 (L) 05/21/2019   K 4.1 05/21/2019   CL 89 (L) 05/21/2019   CO2 29 05/21/2019   BUN  30 (H) 05/21/2019   CREATININE 1.14 (H) 05/21/2019   PROT 5.5 (L) 05/21/2019   ALBUMIN 2.4 (L) 05/21/2019   BILITOT 1.1 05/21/2019   ALKPHOS 70 05/21/2019   AST 61 (H) 05/21/2019   ALT 29 05/21/2019  .   Total Discharge time is about 33 minutes  Shon Hale M.D on 05/25/2019 at 3:29 PM  Go to [email or web address] -  for contact info  [facility] - Office  [phone]

## 2019-05-25 NOTE — Discharge Instructions (Signed)
Discharge to residential hospice with for comfort care

## 2019-05-25 NOTE — TOC Transition Note (Signed)
Transition of Care [facility]) - CM/SW Discharge Note   Patient Details  Name: Murial Beam MRN: [id number] Date of Birth: 1945-08-12  [facility]) CM/SW Contact:  Elliot Gault, LCSW Phone Number: 05/25/2019, 1:50 PM   Clinical Narrative:     [facility] has a bed available for pt today. Updated MD who states pt is stable to transfer. Repeat COVID negative. RN will call report and EMS. Pt's husband is in the room and will update daughter on transport time.  There are no other TOC needs for dc.   Final next level of care: Hospice Medical Facility Barriers to Discharge: Barriers Resolved   Patient Goals and CMS Choice        Discharge Placement                       Discharge Plan and Services                                     Social Determinants of Health (SDOH) Interventions     Readmission Risk Interventions No flowsheet data found.

## 2019-06-12 DEATH — deceased

## 2019-06-13 NOTE — Progress Notes (Deleted)
Cardiology Office Note  Date: 06/13/2019   ID: Susan [name] Jun 02, 1945, MRN [id number]  PCP:  [name], MD  Cardiologist:  Kate Sable, MD Electrophysiologist:  None   Chief Complaint: F/U chronic HFrEF, CAD, NSTEMI , GIB, ICM  History of Present Illness: Susan [name] is a 74 y.o. female with a history of  chronic HFrEF, CAD, NSTEMI , GIB, ICM  Cardiac catheterization November 2020 showed normal overall stable appearance of coronary vessels and grafts.  Medical management was recommended with DAPT therapy for 12 months.  She was hospitalized January 2021 with acute anemia secondary to GI bleed.  Underwent EGD 01/21/2018 showing ulcerative gastritis with no active bleeding started Protonix p.o. twice daily.  Follow-up echo 01/19/2019 showed severely reduced LV systolic dysfunction with EF of 25 to 30%.  She had multiple medication changes.  She was not taking Plavix, nitrates or Toprol.  She was taking Lopressor.  She denied any exertional chest pain or dyspnea.  She continues to smoke a half a pack of cigarettes per day.  She saw me in follow-up on 05/14/2019.  Recent admission for acute exacerbation of CHF on 05/23/2019 with NSTEMI.  Developed chest pain on 05/20/2019 with EKG ST segment changes/depression and troponin elevation above 7500.  Started on heparin 05/20/2019 but developed GI bleed.  Family requested comfort care.  She was awaiting transfer to residential hospice when comfort care was available.  Discharge note states cardiology, palliative care, and GI consults appreciated.   Past Medical History:  Diagnosis Date  . Acute on chronic systolic and diastolic heart failure, NYHA class 1 ([place]) 11/21/2018  . Anxiety   . CAD (coronary artery disease)    a. s/p CABG x5 in 2000 b. cath in 02/2017 showing 2/5 patent grafts with patent LIMA-LAD, patent SVG-distal RCA, occluded SVG-D1 and occluded seq-SVG-OM1-OM2 with medical management recommended and possible PCI of  distal RCA branches in the future if recurrent anginal symptoms  . CHF (congestive heart failure) ([place])    a. EF 20-25% by echo in 12/2016 b. EF 45-50% by echo in 05/2017 c. EF 30% by echo in 11/2018  . Chronic bronchitis ([place])   . CKD (chronic kidney disease) stage 3, GFR 30-59 ml/min 11/21/2018  . COPD (chronic obstructive pulmonary disease) ([place])   . Dizziness   . Esophageal reflux   . Essential hypertension   . Fatigue   . HLD (hyperlipidemia) 11/21/2018  . HTN (hypertension) 11/21/2018  . Hypercholesteremia   . Hyperkalemia   . Ovarian failure   . SOB (shortness of breath)     Past Surgical History:  Procedure Laterality Date  . ABDOMINAL HYSTERECTOMY    . APPENDECTOMY    . BIOPSY  01/22/2019   Procedure: BIOPSY;  Surgeon: Susan Bears, MD;  Location: [facility];  Service: Gastroenterology;;  . BREAST BIOPSY Right   . CARDIAC CATHETERIZATION  2000  . CORONARY ARTERY BYPASS GRAFT  2000   CABG X5  . ESOPHAGOGASTRODUODENOSCOPY (EGD) WITH PROPOFOL N/A 01/22/2019   Procedure: ESOPHAGOGASTRODUODENOSCOPY (EGD) WITH PROPOFOL;  Surgeon: Susan Bears, MD;  Location: [facility] ENDOSCOPY;  Service: Gastroenterology;  Laterality: N/A;  . LEFT HEART CATH AND CORS/GRAFTS ANGIOGRAPHY N/A 02/17/2017   Procedure: LEFT HEART CATH AND CORS/GRAFTS ANGIOGRAPHY;  Surgeon: Burnell Blanks, MD;  Location: [facility];  Service: Cardiovascular;  Laterality: N/A;  . LEFT HEART CATH AND CORS/GRAFTS ANGIOGRAPHY N/A 11/20/2018   Procedure: LEFT HEART CATH AND CORS/GRAFTS ANGIOGRAPHY;  Surgeon: End,  Susan Deer, MD;  Location: [facility] INVASIVE CV LAB;  Service: Cardiovascular;  Laterality: N/A;    No current outpatient medications on file.   No current facility-administered medications for this visit.   Allergies:  Patient has no known allergies.   Social History: The patient  reports that she has been smoking cigarettes. She has a 50.00 pack-year smoking history. She has never used smokeless  tobacco. She reports that she does not drink alcohol or use drugs.   Family History: The patient's family history includes Heart attack in her mother; Hypertension in her father.   ROS:  Please see the history of present illness. Otherwise, complete review of systems is positive for {NONE DEFAULTED:18576::"none"}.  All other systems are reviewed and negative.   Physical Exam: VS:  There were no vitals taken for this visit., BMI There is no height or weight on file to calculate BMI.  Wt Readings from Last 3 Encounters:  05/23/19 89 lb 15.2 oz (40.8 kg)  05/18/19 88 lb 10 oz (40.2 kg)  05/14/19 89 lb 3.2 oz (40.5 kg)    General: Patient appears comfortable at rest. HEENT: Conjunctiva and lids normal, oropharynx clear with moist mucosa. Neck: Supple, no elevated JVP or carotid bruits, no thyromegaly. Lungs: Clear to auscultation, nonlabored breathing at rest. Cardiac: Regular rate and rhythm, no S3 or significant systolic murmur, no pericardial rub. Abdomen: Soft, nontender, no hepatomegaly, bowel sounds present, no guarding or rebound. Extremities: No pitting edema, distal pulses 2+. Skin: Warm and dry. Musculoskeletal: No kyphosis. Neuropsychiatric: Alert and oriented x3, affect grossly appropriate.  ECG:  {EKG/Telemetry Strips Reviewed:[phone]}  Recent Labwork: 01/24/2019: Magnesium 1.9 05/18/2019: B Natriuretic Peptide >4,500.0 05/21/2019: ALT 29; AST 61; BUN 30; Creatinine, Ser 1.14; Hemoglobin 8.4; Platelets 258; Potassium 4.1; Sodium 128     Component Value Date/Time   CHOL 198 11/20/2018 0516   TRIG 114 11/20/2018 0516   HDL 77 11/20/2018 0516   CHOLHDL 2.6 11/20/2018 0516   VLDL 23 11/20/2018 0516   LDLCALC 98 11/20/2018 0516    Other Studies Reviewed Today:  Echocardiogram 01/19/2019:  1. Left ventricular ejection fraction, by visual estimation, is 25 to  30%. The left ventricle has severely decreased function. Left ventricular  septal wall thickness was normal.    2. Left ventricular diastolic function could not be evaluated.  3. Severely dilated left ventricular internal cavity size.  4. Findings consistent with multivessel CAD with prior inferior and  anterior (Septal /apical) wall infarcts findings similar to those on echo  November 2020.  5. Global right ventricle has normal systolc function.The right  ventricular size is normal. no increase in right ventricular wall  thickness.  6. Left atrial size was mildly dilated.  7. The tricuspid valve was not assessed.  8. Mild aortic valve sclerosis without stenosis.  9. Aortic root could not be assessed.  10. ? prominent chiarri network at dome of RA in 4 chamber view.    11/20/18 Cardiac cath Conclusions: 1. Overall stable appearance of the coronary arteries with severe three-vessel coronary artery disease, including chronic total occlusions of the mid LAD, mid LCx, and proximal RCA. 2. Widely patent LIMA-LAD. 3. Patent SVG-distal RCA with up to 50% stenosis in the mid graft. Bifurcation disease involving the distal RCA is similar to prior catheterization, with 90% lesions involving the ostial/proximal rPDA and ostial/proximal RCA continuation. 4. Normal left ventricular filling pressure.  Recommendations: 1. Medical optimization, including escalation of evidence-based heart failure therapy, improved blood pressure control, and smoking cessation.  2. Dual antiplatelet therapy with aspirin and clopidogrel for at least 12 months, if tolerated. 3. If symptoms recur, complex PCI to the distal RCA bifurcation will need to be considered. As previously noted, this may requiring "kissing stents" at the ostia of the rPDA and RCA continuation. This would be a potentially high risk procedure, given the territory supplied (via native vessels and collaterals).  Assessment and Plan:  1. HFrEF (heart failure with reduced ejection fraction) (HCC)   2. CAD in native artery   3. Ischemic  cardiomyopathy   4. Tobacco use    1. HFrEF (heart failure with reduced ejection fraction) (HCC) ***  2. CAD in native artery ***  3. Ischemic cardiomyopathy ***  4. Tobacco use ***  Medication Adjustments/Labs and Tests Ordered: Current medicines are reviewed at length with the patient today.  Concerns regarding medicines are outlined above.   Disposition: Follow-up with ***  Signed, Rennis Harding, NP 06/13/2019 10:40 PM    [facility] at [facility] [address] [place], [place], [place] [address] Phone: [phone]; Fax: [phone]

## 2019-06-14 ENCOUNTER — Ambulatory Visit: Payer: Medicare PPO | Admitting: Family Medicine

## 2019-06-14 ENCOUNTER — Encounter: Payer: Self-pay | Admitting: Family Medicine

## 2019-07-09 ENCOUNTER — Telehealth: Payer: Medicare PPO | Admitting: Cardiovascular Disease

## 2020-01-12 IMAGING — DX DG HAND COMPLETE 3+V*L*
3 series · 3 of 3 positions shown · non-contrast
Comparison: None.

CLINICAL DATA: Fall

EXAM:
LEFT HAND - COMPLETE 3+ VIEW; LEFT WRIST - COMPLETE 3+ VIEW

[hand pa]
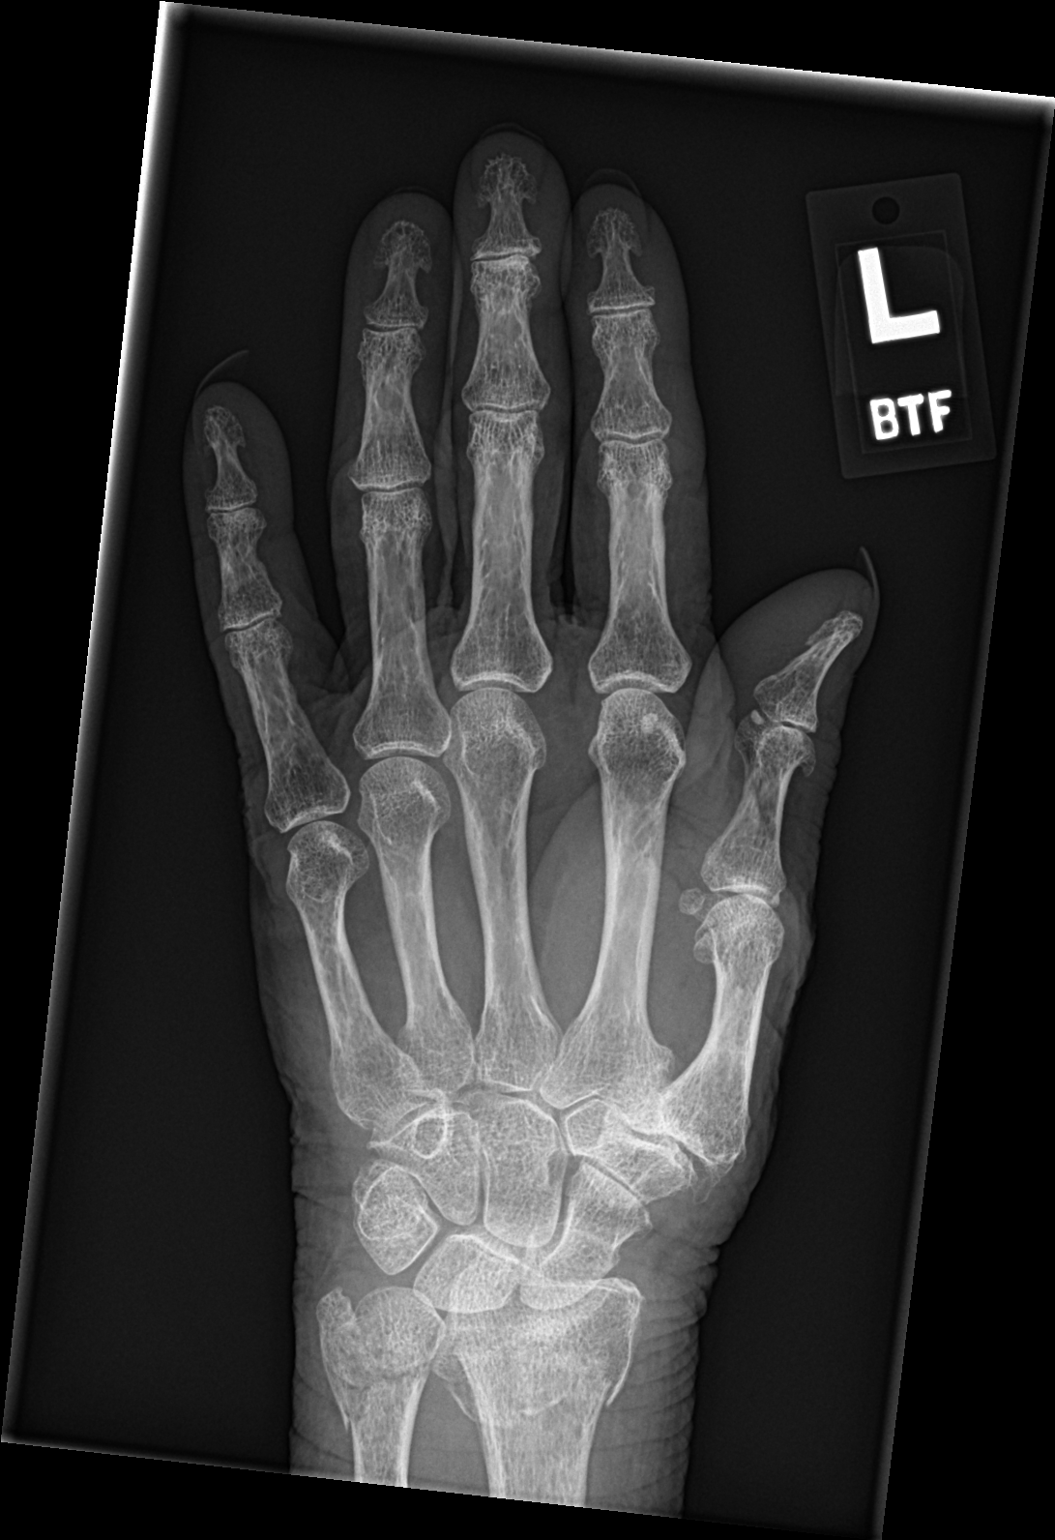

[hand obl]
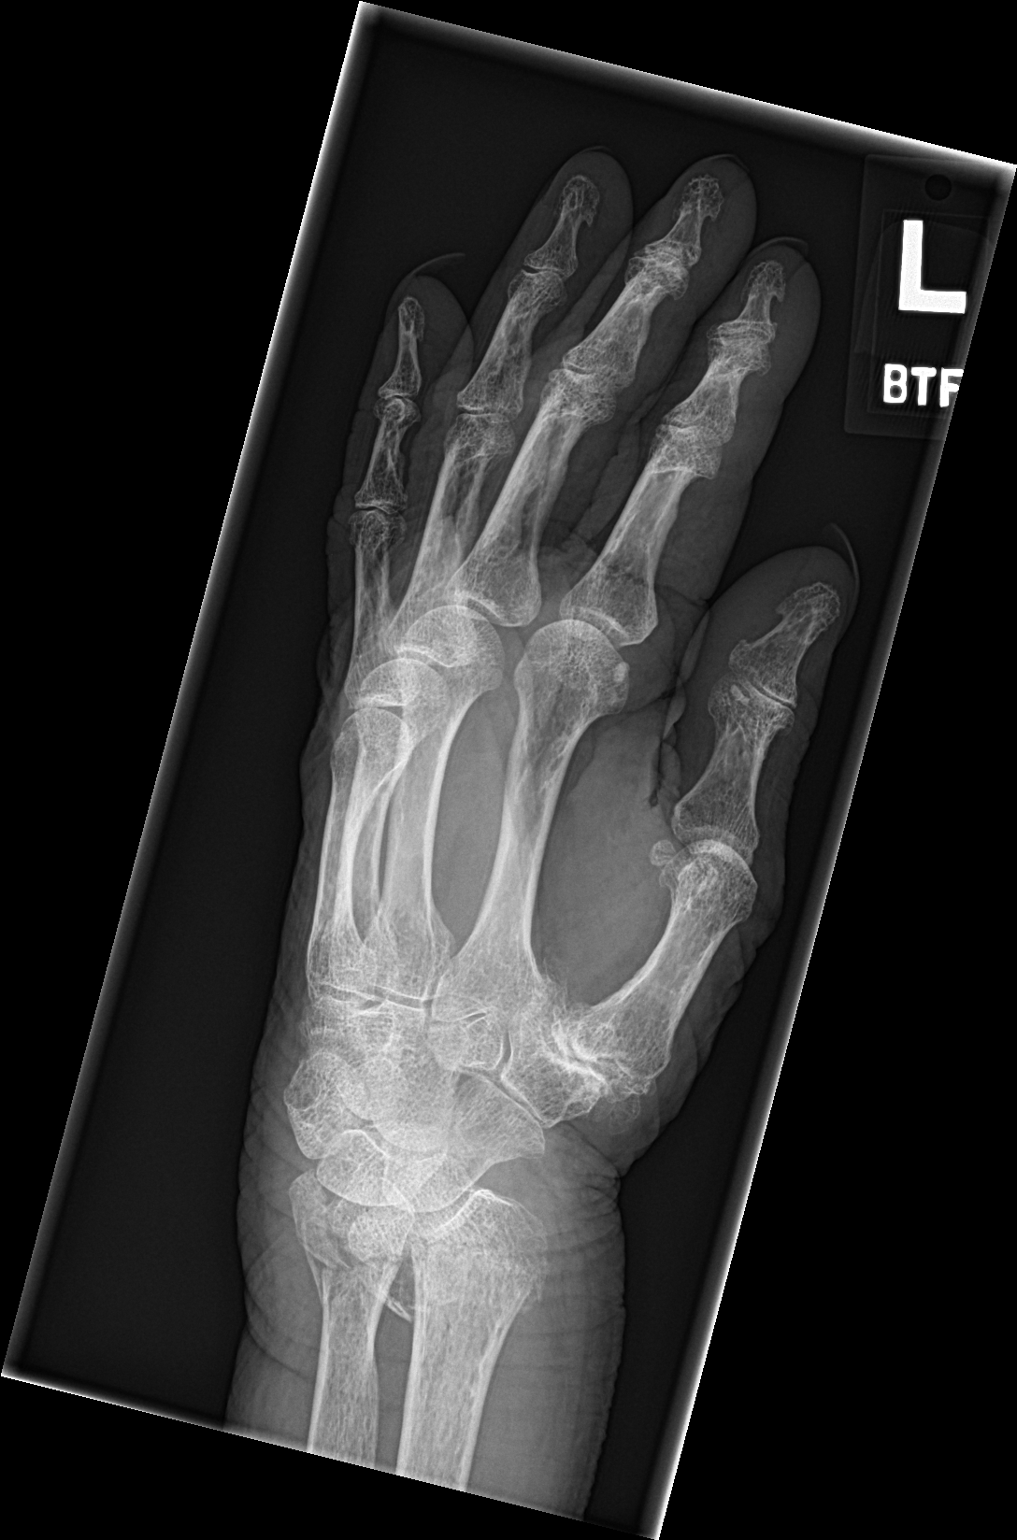

[hand lat]
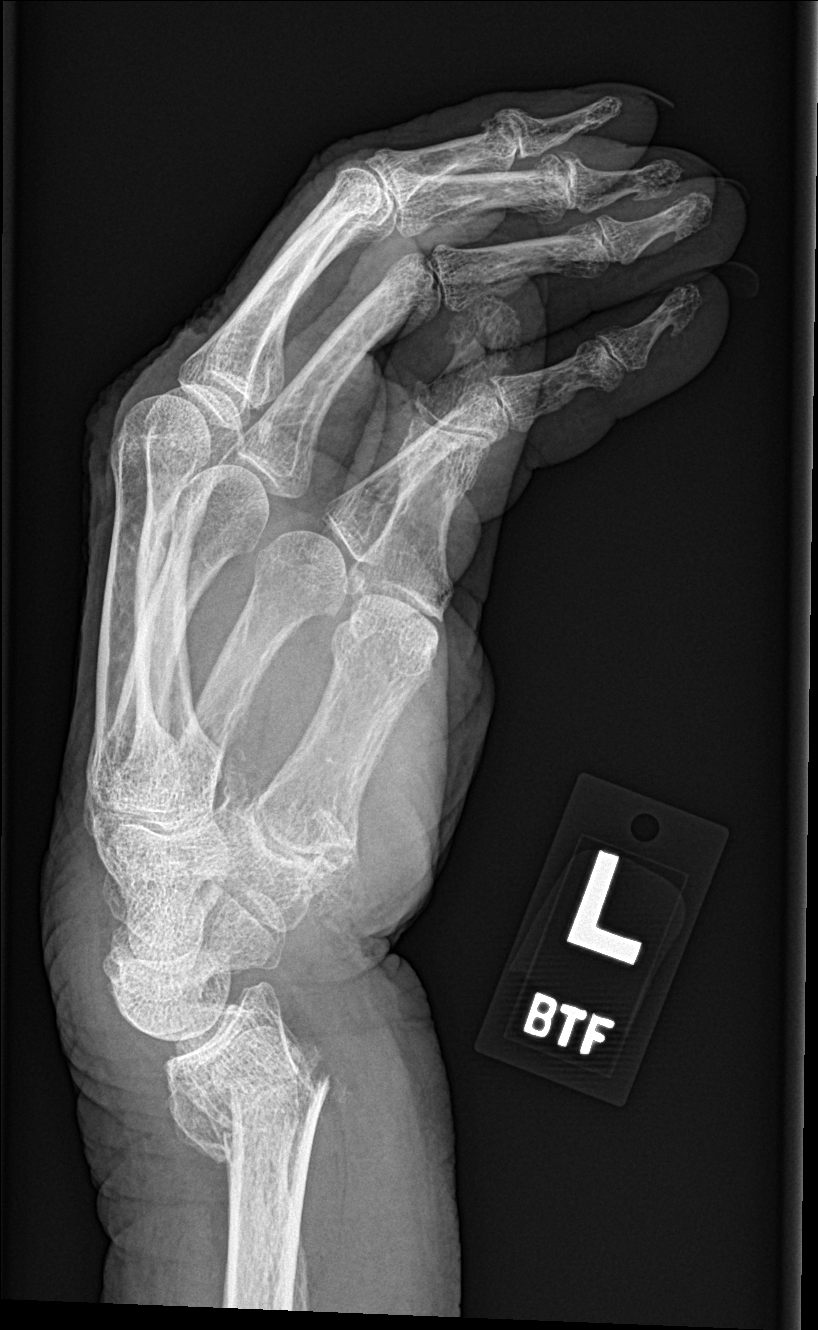

[3 of 3 positions shown; findings below may reference images not displayed]

FINDINGS: There are fractures of the distal left radius and ulna with dorsal
angulation and mild displacement. There are marked changes of
osteoarthritis at the first carpometacarpal joint. Less pronounced
changes of osteoarthritis are present at the triscaphe and distal
interphalangeal joints.
IMPRESSION: Acute fractures of the distal left radius and ulna with dorsal
angulation and mild displacement.

## 2020-04-25 IMAGING — DX DG CHEST 1V PORT
1 series · 1 of 1 positions shown · non-contrast
Comparison: 04/21/2019

CLINICAL DATA: Short of breath, heart failure

EXAM:
PORTABLE CHEST 1 VIEW

[chest ap]
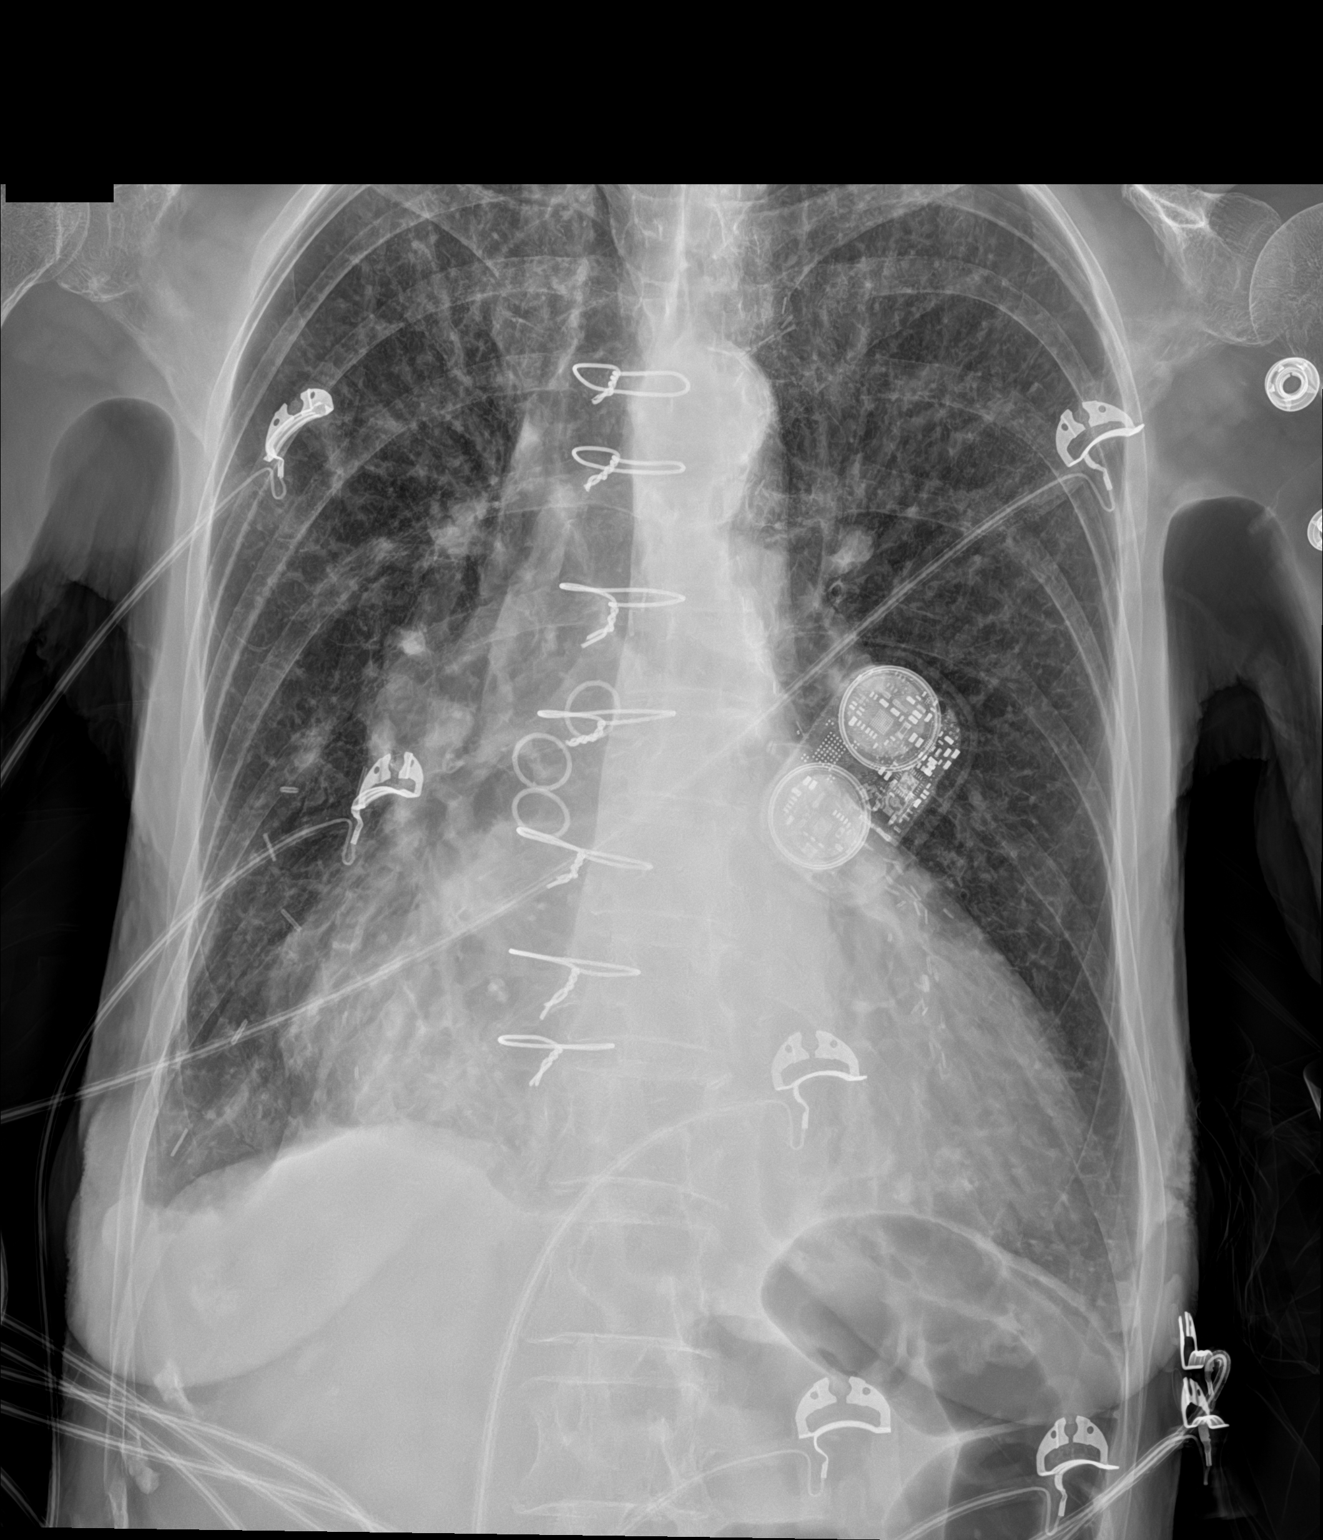

[1 of 1 positions shown; findings below may reference images not displayed]

FINDINGS: Single frontal view of the chest demonstrates persistent enlargement
of the cardiac silhouette. Postsurgical changes from prior CABG.
Background emphysema unchanged. There is chronic central vascular
congestion without airspace disease, effusion, or pneumothorax. No
acute bony abnormality.
IMPRESSION: 1. Stable enlarged cardiac silhouette.
2. Chronic central vascular congestion, no acute process.
3. Emphysema.

## 2020-05-11 IMAGING — DX DG CHEST 1V PORT
1 series · 1 of 1 positions shown · non-contrast
Comparison: 05/02/2018

CLINICAL DATA: Shortness of breath.

EXAM:
PORTABLE CHEST 1 VIEW

[chest ap]
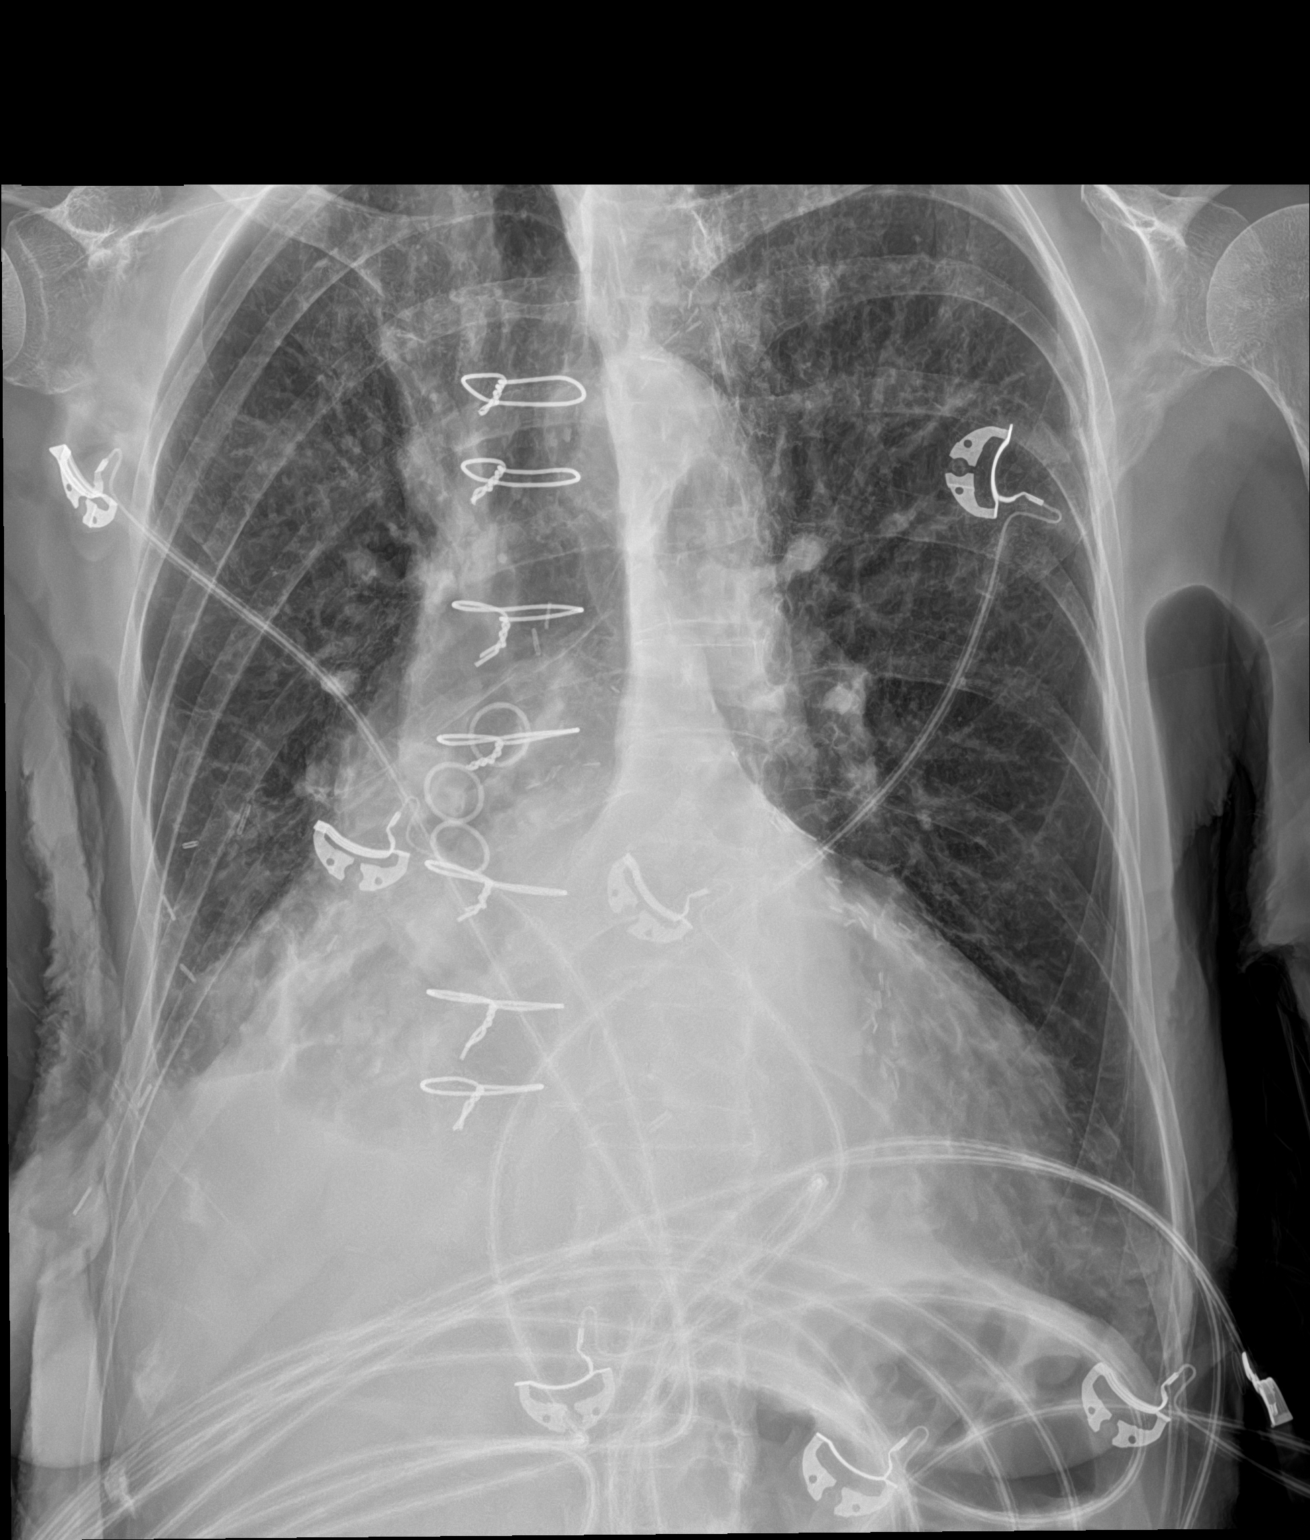

[1 of 1 positions shown; findings below may reference images not displayed]

FINDINGS: Prior median sternotomy and scratched it prior CABG. Stable severe
cardiomegaly. Prominent right lower lobe atelectasis/infiltrate.
Small right pleural effusion. No pneumothorax. Degenerative change
thoracic spine.
IMPRESSION: 1.  Prior CABG.  Stable severe cardiomegaly.

2. Prominent right lower lobe atelectasis/infiltrate. Small right
pleural effusion.
